# Patient Record
Sex: Male | Born: 1985 | Race: White | Hispanic: No | Marital: Married | Smoking: Never smoker
Health system: Southern US, Community
[De-identification: ages and names within clinical notes are randomized; demographics above are authoritative.]

## PROBLEM LIST (undated history)

## (undated) DIAGNOSIS — S83511A Sprain of anterior cruciate ligament of right knee, initial encounter: Secondary | ICD-10-CM

## (undated) DIAGNOSIS — T7840XA Allergy, unspecified, initial encounter: Secondary | ICD-10-CM

## (undated) DIAGNOSIS — K219 Gastro-esophageal reflux disease without esophagitis: Secondary | ICD-10-CM

## (undated) DIAGNOSIS — S83241A Other tear of medial meniscus, current injury, right knee, initial encounter: Secondary | ICD-10-CM

## (undated) DIAGNOSIS — G4730 Sleep apnea, unspecified: Secondary | ICD-10-CM

## (undated) HISTORY — DX: Sprain of anterior cruciate ligament of right knee, initial encounter: S83.511A

## (undated) HISTORY — DX: Allergy, unspecified, initial encounter: T78.40XA

## (undated) HISTORY — DX: Other tear of medial meniscus, current injury, right knee, initial encounter: S83.241A

---

## 2018-05-24 ENCOUNTER — Ambulatory Visit: Payer: Self-pay | Admitting: Sports Medicine

## 2018-05-25 ENCOUNTER — Ambulatory Visit: Payer: Managed Care, Other (non HMO) | Admitting: Sports Medicine

## 2018-05-25 VITALS — BP 118/70 | Ht 69.0 in | Wt 165.0 lb

## 2018-05-25 DIAGNOSIS — M7661 Achilles tendinitis, right leg: Secondary | ICD-10-CM

## 2018-05-25 DIAGNOSIS — M7662 Achilles tendinitis, left leg: Secondary | ICD-10-CM

## 2018-05-25 MED ORDER — NITROGLYCERIN 0.2 MG/HR TD PT24: MEDICATED_PATCH | TRANSDERMAL | 1 refills | Status: DC

## 2018-05-25 NOTE — Patient Instructions (Signed)
Nitroglycerin Protocol   Apply 1/4 nitroglycerin patch to affected area daily.  Change position of patch within the affected area every 24 hours.  You may experience a headache during the first 1-2 weeks of using the patch, these should subside.  If you experience headaches after beginning nitroglycerin patch treatment, you may take your preferred over the counter pain reliever.  Another side effect of the nitroglycerin patch is skin irritation or rash related to patch adhesive.  Please notify our office if you develop more severe headaches or rash, and stop the patch.  Tendon healing with nitroglycerin patch may require 12 to 24 weeks depending on the extent of injury.  Men should not use if taking Viagra, Cialis, or Levitra.   Do not use if you have migraines or rosacea.   Try the patch on one side initially. If that goes well you can begin using it on both achilles tendons.

## 2018-05-25 NOTE — Progress Notes (Signed)
   Subjective:    Patient ID: Leroy Griffith, male    DOB: 11/28/1986, 32 y.o.   MRN: [id number]  HPI Pt presents with B achilles pain since October. He is a long time runner and has run several marathons. Last April, 2018, he ran a half marathon in a low drop shoe Janith Lima([employer]). He works as a Print production plannerschool librarian and the trainer suggested he wear a shoe with more of a heel lift so he switched to a [employer] with more of a heel lift and cup. He only ran about 30 miles in those prior to an October marathon and felt like the heel cup was hitting his mid Achilles. The school trainer expressed concern that he may have tenosynovitis and said to ice and stretch. He was icing but has stopped as he felt like he was no longer getting any benefit from it. No meds tried. He has had a noticeable lump in B mid Achilles since October that is tender to touch. He feels very stiff in his Achilles when he first gets up in the morning and when he is still for a prolonged period of time. This sensation tends to improve with movement. He notices his Achilles all the time but they seem to feel better as the day progresses. He has not run or gotten much exercise since January due to pain and concern that he would makes things worse. Pain is currently 3/10 but gets up to 6-7/10. No arch/ foot/ calf/ knee/ hip pain. No distinct injury. He thinks this is just from overuse. He has not tried any type of splint. No paresthesias or other concerns.  Past medical history reviewed Medications reviewed Allergies reviewed   Review of Systems As above    Objective:   Physical Exam Seated on exam table in NAD B Achilles tendons are visibly swollen centrally, the remainder of BLEs and foot appear WNL Palpation of B Achilles are very TTP centrally; they are NTTP over the origin and insertion NL ROM of the ankle NL strength of the ankle with doris and plantar flexion B ankles are stable NVI with 2/2 B DP and PT pulses  Limited BS US performed  which showed significant B Achilles tendon edema with interstitial partial tearing, L > R noted on transverse view.     Assessment & Plan:  B Achilles tendinopathy  Tendinopathy arising as a result of wearing low drop shoe last year. Green shoe inserts with B 5/16" heel lifts. Avoid walking barefoot. NTG protocol B Achilles. Start with one side to assess how he is able to tolerate it then add second side. Expect to continue this tx for 3-6 months if tolerated to improve blood flow and expedite healing. B Achilles strengthening exercises (heel drops). May start doing some running again on a flat surface. No sprinting or jumping. F/u in 6 weeks. Will plan on rechecking ultrasound at that time. Anticipate improvement of symptoms prior to improvement on ultrasound visualization.  Total time spent with the patient was 30 minutes with greater than 50% of the time spent in face-to-face consultation discussing his diagnosis, treatment, and performing bilateral Achilles ultrasounds.

## 2018-05-26 ENCOUNTER — Encounter: Payer: Self-pay | Admitting: Sports Medicine

## 2018-07-12 ENCOUNTER — Ambulatory Visit: Payer: Managed Care, Other (non HMO) | Admitting: Sports Medicine

## 2018-07-12 VITALS — BP 104/72 | Ht 69.0 in | Wt 165.0 lb

## 2018-07-12 DIAGNOSIS — M7661 Achilles tendinitis, right leg: Secondary | ICD-10-CM

## 2018-07-12 DIAGNOSIS — M7662 Achilles tendinitis, left leg: Secondary | ICD-10-CM | POA: Diagnosis not present

## 2018-07-12 MED ORDER — NITROGLYCERIN 0.2 MG/HR TD PT24: MEDICATED_PATCH | TRANSDERMAL | 1 refills | Status: DC

## 2018-07-12 NOTE — Progress Notes (Signed)
   Subjective:    Patient ID: Leroy Griffith, male    DOB: 05/02/1986, 32 y.o.   MRN: [id number]  HPI Patient is a 32 yo male who presents today to follow up on bilateral achilles tendinopathy secondary to partial interstitial partial tear. Patient was seen in clinic 6 weeks ago and had edema seen on US as well as swelling around the mid substance of his achilles. Patient was given heel lift on green insoles (5/16") he has been using them in all his shoes. Patient has also been doing heel drop exercises and use nitroglycerin patch on both achilles. He reports he feels 40% better, he still has some swelling but pain is much better controlled. Patient is still not running. He understands this is going to be a long process 3-6 months but does not want to rupture to achilles tendon by returning to running to soon.  Review of Systems  Skin: Negative.   Neurological: Negative.   Bilateral achilles tenderness Bilateral achilles swelling    Objective:   Physical Exam General: NAD, pleasant, able to participate in exam Achilles Exam: Bilateral mid substance swelling noted on exam. Patient is tender to palpation bilaterally. No tenderness to palpation at the insertion point. Full ROM at the ankle bilaterally. Strength and sensation intact. Neurovascularly intact with palpable pulses and warm feet.     Assessment & Plan:   U/S bilateral achilles Decrease interval edema bilaterally with sign of interstitial partial tearing both  In long and short axis. Thickened tendons bilaterally consistent with prior imaging findings.  Bilateral achilles tendinopathy, chronic, improving  Patient reports moderate improvement with heel lift, hell drop exercises and nitro patch. Still limited from a running standpoint. U/S still showing mild edema with thickening of the tendons bilaterally. Exam findings consistent with imaging with some swelling and tenderness in the mid substance bilaterally. We will continue with current  treatment plan, patient was provided with another set of heel lift. Nitroglycerin patches were refilled. Continue with heel drop exercises. Reiterated no jumping or sprinting, or walking barefoot. Patient will follow up in clinic in 4-6 weeks for reassessment. Expectation for full recovery could be up to 6 months. Could resume running on flat surface.   Patient seen and evaluated with the resident. I agree with the above plan of care. Patient is showing clinical improvement and today's ultrasound shows less fluid in the Achilles tendon sheath. There is still evidence of tendinopathy and interstitial tearing. Patient will continue with current treatment plan. I did give him another pair of heel lifts to use in another pair shoes. Continue with nitroglycerin patches. Continue with eccentric heel drops. Follow-up in 6 weeks for reevaluation. He will be 3 months out from diagnosis at that time. We will plan on repeating his ultrasound at that visit.

## 2018-07-13 ENCOUNTER — Telehealth: Payer: Self-pay | Admitting: Sports Medicine

## 2018-07-13 NOTE — Telephone Encounter (Signed)
Patient needs for us to call [facility] on [address], and let them know to change script to a 90 day supply (for Nitro patch) since he is going away, and let them know that enough time has elapsed since last prescription.  [phone]

## 2018-07-21 ENCOUNTER — Other Ambulatory Visit: Payer: Self-pay | Admitting: *Deleted

## 2018-07-21 MED ORDER — NITROGLYCERIN 0.2 MG/HR TD PT24: MEDICATED_PATCH | TRANSDERMAL | 1 refills | Status: DC

## 2018-08-24 ENCOUNTER — Ambulatory Visit: Payer: Managed Care, Other (non HMO) | Admitting: Sports Medicine

## 2018-08-24 VITALS — BP 114/64 | Ht 69.0 in | Wt 165.0 lb

## 2018-08-24 DIAGNOSIS — M7661 Achilles tendinitis, right leg: Secondary | ICD-10-CM | POA: Diagnosis not present

## 2018-08-24 DIAGNOSIS — M7662 Achilles tendinitis, left leg: Secondary | ICD-10-CM | POA: Diagnosis not present

## 2018-08-25 NOTE — Progress Notes (Signed)
   Subjective:    Patient ID: Leroy Griffith, male    DOB: 02-May-1986, 32 y.o.   MRN: [id number]  HPI   Leroy Griffith comes in today for follow-up on bilateral Achilles tendinopathy. He is now 3 months into treatment.He is using a quarter patch nitroglycerin. He is doing his eccentric exercises. He has been wearing his heel lifts. He has not returned to running yet.Overall, he is doing very well. He did "tweak" his right Achilles tendon the other day when chasing his kids. It was a little sore the next day but his discomfort resolved rather quickly. His left Achilles tendon is almost completely pain-free. He denies any new complaints.   Review of Systems As above    Objective:   Physical Exam  Well-developed, fit appearing. No acute distress. Awake alert and oriented 3. Vital signs reviewed.  Examination of the left Achilles shows minimal thickening in the midportion of the tendon. No tenderness to palpation. Good ankle dorsi flexion. Good strength.Neurovascular intact distally. Examination of the right Achilles tendon still shows moderate thickening in the midportion of the tendon. Mild tenderness to palpation. Good ankle dorsiflexion. Good strength. Neurovascularly intact distally. Patient walks without a limp.  MSK ultrasound of the right Achilles tendon continues to show thickening of the midportion of the tendon both on long and short axis. There is still some interstitial tearing within the tendon as well. Findings consistent with Achilles tendon tendinopathy.      Assessment & Plan:   Bilateral Achilles tendinopathy  Left Achilles tendon is doing much better. Right Achilles tendon is improving but he still has some mild discomfort. I recommended that he discontinue the nitroglycerin patch on the left Achilles and increase the nitroglycerin patch on the right to a half patch daily. He will continue with his eccentric strengthening and I think he is okay to re-introduce some easy running if his  symptoms allow.He will follow-up with me again in 6 weeks for reevaluation. I do not feel the need to repeat his ultrasound at that time. We will consider discontinuing nitroglycerin altogether at that visit if he continues to improve. Patient is encouraged to call me with questions or concerns prior to that visit.

## 2018-09-20 ENCOUNTER — Other Ambulatory Visit: Payer: Self-pay | Admitting: *Deleted

## 2018-09-20 MED ORDER — NITROGLYCERIN 0.2 MG/HR TD PT24: MEDICATED_PATCH | TRANSDERMAL | 1 refills | Status: DC

## 2018-10-11 ENCOUNTER — Ambulatory Visit: Payer: Managed Care, Other (non HMO) | Admitting: Sports Medicine

## 2018-10-11 VITALS — BP 116/64 | Ht 69.0 in | Wt 165.0 lb

## 2018-10-11 DIAGNOSIS — M7662 Achilles tendinitis, left leg: Secondary | ICD-10-CM

## 2018-10-11 DIAGNOSIS — M7661 Achilles tendinitis, right leg: Secondary | ICD-10-CM | POA: Diagnosis not present

## 2018-10-11 NOTE — Progress Notes (Signed)
   Subjective:    Patient ID: Leroy Griffith, male    DOB: 1986-08-21, 32 y.o.   MRN: [id number]  HPI   Leroy Griffith comes in today for follow-up on bilateral Achilles tendinopathy.  Left Achilles tendon is relatively pain-free.  Right Achilles tendon is improving.  He has returned to some limited running.  He has run a couple of 5K's.  Some mild discomfort in the right Achilles but not significant.  He still has thickening in the mid substance of the Achilles.  He is wearing his heel lifts when running.  He continues to use topical nitroglycerin as well.   Review of Systems    As above Objective:   Physical Exam  Well-developed, well-nourished.  No acute distress.  Awake alert and oriented x3.  Vital signs reviewed  Right Achilles tendon: Once again noted is thickening of the mid substance of the Achilles tendon.  It is minimally tender to palpation.  No soft tissue swelling.  No pain with Achilles stretch.  Good strength.  Walking without a limp.      Assessment & Plan:  Improving bilateral Achilles tendinopathy  Patient will continue with his topical nitroglycerin for 1 additional month giving him a total of 6 months of treatment.  He may start to transition to a maintenance phase of heel drop exercises 3 times weekly instead of daily.  He may continue to increase his running utilizing the "10% rule.  He should continue to wear his heel lifts while running.  He has made good progress to date.  I will go ahead and discharge him from my care to follow-up as needed.

## 2018-11-15 ENCOUNTER — Other Ambulatory Visit: Payer: Self-pay | Admitting: *Deleted

## 2018-11-15 MED ORDER — NITROGLYCERIN 0.2 MG/HR TD PT24: MEDICATED_PATCH | TRANSDERMAL | 1 refills | Status: DC

## 2020-02-09 ENCOUNTER — Ambulatory Visit: Payer: Managed Care, Other (non HMO)

## 2021-12-02 ENCOUNTER — Ambulatory Visit: Payer: No Typology Code available for payment source | Admitting: Mental Health

## 2021-12-03 ENCOUNTER — Ambulatory Visit: Payer: No Typology Code available for payment source | Admitting: Mental Health

## 2021-12-17 NOTE — Progress Notes (Signed)
Subjective:    CC: R knee pain  I, Molly Weber, LAT, ATC, am serving as scribe for Dr. Clementeen Graham.  HPI: Pt is a 36 y/o male presenting w/ c/o R knee pain since mid-Dec while playing in a company basketball game.  He states that he hyperextended his knee at that time of injury and felt a pop in his knee.  He developed swelling as well.  His knee pain improved until New Year's day when he planted and rotated while in the kitchen and had a sharp, shooting pain along his R lateral knee and thigh.  He locates his pain to his R lateral knee.  R knee swelling: yes R knee mechanical symptoms: yes at the time of the basketball injury R LE numbness/tingling: yes into his R dorsal foot Aggravating factors: attempts at R knee ext; walking/weight-bearing  Treatments tried: ice; IBU; rest  Pertinent review of Systems: No fevers or chills  Relevant historical information: Otherwise healthy   Objective:    Vitals:   12/18/21 0922  BP: 110/70  Pulse: 83  SpO2: 97%   General: Well Developed, well nourished, and in no acute distress.   MSK: Right knee moderate effusion. Range of motion 5-100 degrees. Tender palpation lateral joint line. No laxity to LCL or MCL stress test. Positive lateral McMurray's test. Some laxity with guarding with anterior drawer test. Intact strength however pain with resisted knee flexion. Significant antalgic gait.  L-spine nontender midline.  Normal lumbar motion.  Lower extremity strength is intact.  Reflexes are intact.   Lab and Radiology Results  X-ray images right knee obtained today personally and independently interpreted No acute fractures.  No significant DJD. Await formal radiology review    Impression and Recommendations:    Assessment and Plan: 36 y.o. male with right knee pain with effusion after a mechanical injury to the knee playing basketball a few weeks ago.  Physical exam highly concerning for significant displaced meniscus  tear with possible ACL tear. Based on his significant lack of range of motion and positive McMurray's test and anterior drawer test as well as difficulty ambulating plan for MRI to further characterize internal derangement.  If MRI shows suspected lateral meniscus tear and possible ACL tear may refer directly to orthopedic surgery. Recommended crutches for now.  Tylenol and high-dose NSAID as needed for pain control.  Additionally patient does have some pain radiating down the lateral thigh to the lateral calf dorsal foot.  This is most likely L5 radiculopathy or perhaps peroneal nerve irritation.  Fortunately does not have distal weakness.  The symptoms are much less dominant than his knee symptoms.  Plan for watchful waiting for now.  Backup prednisone and gabapentin prescribed that he can take if needed.  I do not expect it will actually be taken these medications for now.  PDMP not reviewed this encounter. Orders Placed This Encounter  Procedures   DG Knee AP/LAT W/Sunrise Right    Standing Status:   Future    Number of Occurrences:   1    Standing Expiration Date:   01/18/2022    Order Specific Question:   Reason for Exam (SYMPTOM  OR DIAGNOSIS REQUIRED)    Answer:   R knee pain    Order Specific Question:   Preferred imaging location?    Answer:   [facility]   MR Knee Right Wo Contrast    R knee MRI to r/o possible ACL and meniscal tear.  Standing Status:   Future    Standing Expiration Date:   12/18/2022    Order Specific Question:   What is the patient's sedation requirement?    Answer:   No Sedation    Order Specific Question:   Does the patient have a pacemaker or implanted devices?    Answer:   No    Order Specific Question:   Preferred imaging location?    Answer:   [facility] (table limit-350lbs)   Meds ordered this encounter  Medications   predniSONE (DELTASONE) 50 MG tablet    Sig: Take 1 tablet (50 mg total) by mouth daily with breakfast for 5 days.     Dispense:  5 tablet    Refill:  0   gabapentin (NEURONTIN) 300 MG capsule    Sig: Take 1 capsule (300 mg total) by mouth 3 (three) times daily.    Dispense:  90 capsule    Refill:  2    Discussed warning signs or symptoms. Please see discharge instructions. Patient expresses understanding.   The above documentation has been reviewed and is accurate and complete Clementeen Graham, M.D.

## 2021-12-18 ENCOUNTER — Other Ambulatory Visit: Payer: Self-pay

## 2021-12-18 ENCOUNTER — Ambulatory Visit: Payer: Self-pay

## 2021-12-18 ENCOUNTER — Ambulatory Visit (INDEPENDENT_AMBULATORY_CARE_PROVIDER_SITE_OTHER): Payer: No Typology Code available for payment source | Admitting: Mental Health

## 2021-12-18 ENCOUNTER — Ambulatory Visit (INDEPENDENT_AMBULATORY_CARE_PROVIDER_SITE_OTHER): Payer: PRIVATE HEALTH INSURANCE | Admitting: Family Medicine

## 2021-12-18 ENCOUNTER — Ambulatory Visit (INDEPENDENT_AMBULATORY_CARE_PROVIDER_SITE_OTHER): Payer: PRIVATE HEALTH INSURANCE

## 2021-12-18 ENCOUNTER — Encounter: Payer: Self-pay | Admitting: Family Medicine

## 2021-12-18 VITALS — BP 110/70 | HR 83 | Ht 69.0 in | Wt 184.8 lb

## 2021-12-18 DIAGNOSIS — R202 Paresthesia of skin: Secondary | ICD-10-CM | POA: Diagnosis not present

## 2021-12-18 DIAGNOSIS — M25561 Pain in right knee: Secondary | ICD-10-CM

## 2021-12-18 DIAGNOSIS — F4323 Adjustment disorder with mixed anxiety and depressed mood: Secondary | ICD-10-CM

## 2021-12-18 MED ORDER — PREDNISONE 50 MG PO TABS: 50.0000 mg | ORAL_TABLET | Freq: Every day | ORAL | 0 refills | Status: AC

## 2021-12-18 MED ORDER — GABAPENTIN 300 MG PO CAPS: 300.0000 mg | ORAL_CAPSULE | Freq: Three times a day (TID) | ORAL | 2 refills | Status: DC

## 2021-12-18 NOTE — Patient Instructions (Addendum)
Nice to meet you.  I've prescribed Gabapentin and prednisone to use as needed.  I've referred you for a R knee MRI.  They will call you to schedule but I'm also providing you w/ their phone number to use if you haven't heard from them regarding scheduling by Friday.  MRI phone number [phone]  Go to [facility] for some crutches.  Follow-up: as needed based on the MRI results

## 2021-12-18 NOTE — Progress Notes (Signed)
[facility] Initial Adult Exam  Name: Ilias Stcharles Date: 12/18/2021 MRN: [id number] DOB: 1986-07-07 PCP: Cheral Bay, MD  Time spent: 50 minutes  Reason for Visit /Presenting Problem:  He states he is here to ensure he is being supportive to his family. His daughter copes w/ OCD, anxiety-  germ related for the past 2 years; she is age 37. Son is age 37. Wife had a miscarriage between the two. After the miscarriage, wife developed a lot of anxiety. Patient stated his father committed suicide about 10 years ago, was an alcoholic.  Stated his daughter is "strong-willed".  He stated wife and at times, daughter who both cope w/ germ related anxiety. He feels "micco-managed" at times but his wife at times.  1 example of her obsessive compulsive tendencies or if he is going to bed and touches his friends set his alarm on his nightstand, he limits to get up and wash his hands prior to returning to bed.  If he does not,the sheets have to be washed etc. He stated that he has noticed when he and his wife visit her parents, she is more able to work on some of her anxiety related issues.  He stated that he is supportive of her continuing her therapy and how their daughter is also currently in therapy.  Mental Status Exam:    Appearance:    Casual     Behavior:   Appropriate  Motor:   WNL  Speech/Language:    Clear and Coherent  Affect:   Full range   Mood:   Euthymic  Thought process:   Logical, linear, goal directed  Thought content:     WNL  Sensory/Perceptual disturbances:     none  Orientation:   x4  Attention:   Good  Concentration:   Good  Memory:   Intact  Fund of knowledge:    Consistent with age and development  Insight:     Good  Judgment:    Good  Impulse Control:   Good     Reported Symptoms:  intermittent anxiety and depressed mood related to daily stressors  Risk Assessment: Danger to Self:  No Self-injurious Behavior: No Danger to Others: No Duty to Warn:no Physical  Aggression / Violence:No  Access to Firearms a concern: No  Gang Involvement:No  Patient / guardian was educated about steps to take if suicide or homicide risk level increases between visits: yes While future psychiatric events cannot be accurately predicted, the patient does not currently require acute inpatient psychiatric care and does not currently meet [facility] involuntary commitment criteria.  Substance Abuse History: Current substance abuse: none  Medications: Current Outpatient Medications  Medication Sig Dispense Refill   cetirizine (ZYRTEC) 10 MG tablet Take by mouth.     Multiple Vitamin (THERA) TABS Take by mouth.     nitroGLYCERIN (NITRODUR - DOSED IN MG/24 HR) 0.2 mg/hr patch Place 1/4 patch to each leg 30 patch 1   No current facility-administered medications for this visit.    No Known Allergies  Diagnoses:    ICD-10-CM   1. Adjustment disorder with mixed anxiety and depressed mood  F43.23       Plan of Care: TBD   Waldron Session, [facility]

## 2021-12-19 NOTE — Progress Notes (Signed)
Right knee x-ray shows some joint swelling but no fractures.  MRI should be helpful

## 2021-12-21 ENCOUNTER — Other Ambulatory Visit: Payer: Self-pay

## 2021-12-21 ENCOUNTER — Ambulatory Visit (INDEPENDENT_AMBULATORY_CARE_PROVIDER_SITE_OTHER): Payer: PRIVATE HEALTH INSURANCE

## 2021-12-21 DIAGNOSIS — M25561 Pain in right knee: Secondary | ICD-10-CM | POA: Diagnosis not present

## 2021-12-22 ENCOUNTER — Encounter: Payer: Self-pay | Admitting: Family Medicine

## 2021-12-23 ENCOUNTER — Telehealth: Payer: Self-pay | Admitting: Family Medicine

## 2021-12-23 ENCOUNTER — Encounter: Payer: Self-pay | Admitting: Orthopedic Surgery

## 2021-12-23 DIAGNOSIS — S83211A Bucket-handle tear of medial meniscus, current injury, right knee, initial encounter: Secondary | ICD-10-CM

## 2021-12-23 DIAGNOSIS — S83511A Sprain of anterior cruciate ligament of right knee, initial encounter: Secondary | ICD-10-CM

## 2021-12-23 NOTE — Progress Notes (Signed)
MRI shows a ACL tear and a bucket-handle meniscus tear.  Both of these will require surgery.  I will refer you directly to orthopedic surgery.  I am glad we did the MRI so quickly.

## 2021-12-23 NOTE — Telephone Encounter (Signed)
Dr. Denyse Amass has responded to pt's MyChart message and pt informed that he has been referred to [facility]

## 2021-12-23 NOTE — Telephone Encounter (Signed)
Refer to orthopedic surgery at [facility] care.

## 2021-12-25 ENCOUNTER — Ambulatory Visit: Payer: No Typology Code available for payment source | Admitting: Orthopedic Surgery

## 2021-12-27 ENCOUNTER — Other Ambulatory Visit: Payer: Self-pay

## 2021-12-27 ENCOUNTER — Encounter (HOSPITAL_BASED_OUTPATIENT_CLINIC_OR_DEPARTMENT_OTHER): Payer: Self-pay | Admitting: Orthopedic Surgery

## 2021-12-27 ENCOUNTER — Ambulatory Visit (INDEPENDENT_AMBULATORY_CARE_PROVIDER_SITE_OTHER): Payer: PRIVATE HEALTH INSURANCE | Admitting: Orthopedic Surgery

## 2021-12-27 DIAGNOSIS — S83511A Sprain of anterior cruciate ligament of right knee, initial encounter: Secondary | ICD-10-CM | POA: Diagnosis not present

## 2021-12-28 ENCOUNTER — Encounter: Payer: Self-pay | Admitting: Orthopedic Surgery

## 2021-12-28 NOTE — Progress Notes (Signed)
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  He was playing basketball 11/28/2021 when he sustained an injury.  He has a pivot shift type mechanism of injury and took it easy after that for a week until he had a reinjury in the kitchen about 2 weeks later when he "stepped wrong".  Felt like there was a blood pressure cuff around the knee.  Reports some occasional clicking with normal walking and also describes symptomatic instability in the knee.  He has been using a brace.  He works as a Occupational hygienist.  Normally his activities are running.  He has a son that he likes to do activities with including taekwondo.  No personal or family history of DVT or pulmonary embolism.  MRI scan is reviewed with the patient.  Does show ACL tear along with bruising consistent with pivot shift mechanism of injury.  He also has significant complex medial meniscal tear with small bucket-handle component.              ROS: All systems reviewed are negative as they relate to the chief complaint within the history of present illness.  Patient denies  fevers or chills.   Assessment & Plan: Visit Diagnoses:  1. Rupture of anterior cruciate ligament of right knee, initial encounter     Plan: Impression is right knee ACL tear with complex medial meniscal tear.  Patient is very active.  He is having symptomatic instability.  Has excellent range of motion at this time lacking only about 2 to 3 degrees of full extension.  Plan is ACL reconstruction.  We discussed graft options including allograft versus autograft.  I think he would do well with quadriceps autograft.  We would also proceed with meniscal debridement  leaving as much of the normal rim present as possible.  There is no posterior lateral rotatory instability at this time.  The timeline for recovery including achieving full extension within 3 weeks and full flexion within 6 weeks is discussed.  Anticipate return to straightahead running around 4 months.  I do not think it is a great idea for him to run exclusively for exercise moving forward predictably based on the absence of his meniscal cushioning function in the knee.  Use of CPM machine until he gets to 90 degrees of flexion also discussed.  Driving is more of a 3 to 4-week proposition to return to without concern.  All questions answered.  Follow-Up Instructions: No follow-ups on file.   Orders:  No orders of the defined types were placed in this encounter.  No orders of the defined types were placed in this encounter.     Procedures: No procedures performed   Clinical Data: No additional findings.  Objective: Vital Signs: There were no vitals taken for this visit.  Physical Exam:   Constitutional: Patient appears well-developed HEENT:  Head: Normocephalic Eyes:EOM are normal Neck: Normal range of motion Cardiovascular: Normal rate Pulmonary/chest: Effort normal Neurologic: Patient is alert Skin: Skin is warm Psychiatric: Patient has normal mood and affect   Ortho Exam: Ortho exam demonstrates good  range of motion of the right knee lacking only about 2 to 3 degrees of full extension on the right which is consistent with his bucket-handle meniscal tear.  Flexion is to 120.  Collaterals are stable to varus valgus stress on the right.  Pedal pulses palpable.  No calf tenderness negative Homans on the right.  No posterior lateral rotatory instability is noted.  ACL laxity is present with positive Lachman positive anterior drawer.  PCL intact.  Collaterals are stable to varus valgus stress at 0 and 30 degrees.  Specialty Comments:  No specialty comments  available.  Imaging: No results found.   PMFS History: There are no problems to display for this patient.  History reviewed. No pertinent past medical history.  History reviewed. No pertinent family history.  History reviewed. No pertinent surgical history. Social History   Occupational History   Not on file  Tobacco Use   Smoking status: Never   Smokeless tobacco: Never  Vaping Use   Vaping Use: Never used  Substance and Sexual Activity   Alcohol use: Yes    Comment: occassionally   Drug use: Never   Sexual activity: Not on file

## 2022-01-02 ENCOUNTER — Ambulatory Visit (INDEPENDENT_AMBULATORY_CARE_PROVIDER_SITE_OTHER): Payer: No Typology Code available for payment source | Admitting: Mental Health

## 2022-01-02 DIAGNOSIS — F4323 Adjustment disorder with mixed anxiety and depressed mood: Secondary | ICD-10-CM | POA: Diagnosis not present

## 2022-01-02 NOTE — Progress Notes (Signed)
[facility] psychotherapy note Name: Leroy Griffith Date: 01/02/2022 MRN: [id number] DOB: Apr 09, 1986 PCP: Pcp, No  Time spent: 50 minutes  Treatment: Individual therapy  Virtual Visit via Telehealth Note Connected with patient by a telemedicine/telehealth application, with their informed consent, and verified patient privacy and that I am speaking with the correct person using two identifiers. I discussed the limitations, risks, security and privacy concerns of performing psychotherapy and the availability of in person appointments. I also discussed with the patient that there may be a patient responsible charge related to this service. The patient expressed understanding and agreed to proceed. I discussed the treatment planning with the patient. The patient was provided an opportunity to ask questions and all were answered. The patient agreed with the plan and demonstrated an understanding of the instructions. The patient was advised to call  our office if  symptoms worsen or feel they are in a crisis state and need immediate contact.   Therapist Location: office Patient Location: home    Mental Status Exam:    Appearance:    Casual     Behavior:   Appropriate  Motor:   WNL  Speech/Language:    Clear and Coherent  Affect:   Full range   Mood:   Euthymic  Thought process:   Logical, linear, goal directed  Thought content:     WNL  Sensory/Perceptual disturbances:     none  Orientation:   x4  Attention:   Good  Concentration:   Good  Memory:   Intact  Fund of knowledge:    Consistent with age and development  Insight:     Good  Judgment:    Good  Impulse Control:   Good     Reported Symptoms:  intermittent anxiety and depressed mood related to daily stressors  Risk Assessment: Danger to Self:  No Self-injurious Behavior: No Danger to Others: No Duty to Warn:no Physical Aggression / Violence:No  Access to Firearms a concern: No  Gang Involvement:No  Patient / guardian was  educated about steps to take if suicide or homicide risk level increases between visits: yes While future psychiatric events cannot be accurately predicted, the patient does not currently require acute inpatient psychiatric care and does not currently meet [facility] involuntary commitment criteria.  Substance Abuse History: Current substance abuse: none  Medications: Current Outpatient Medications  Medication Sig Dispense Refill   cetirizine (ZYRTEC) 10 MG tablet Take by mouth.     ibuprofen (ADVIL) 200 MG tablet Take 200 mg by mouth every 6 (six) hours as needed.     PARoxetine (PAXIL) 10 MG tablet Take 10 mg by mouth daily.     No current facility-administered medications for this visit.    Subjective: Patient engaged in telehealth session via video.  He shared recent events in progress where he stated that he has had some recent stressful discussions with his wife.  He stated that she feels a sense of shame and blame from him, primarily citing the reactions he may have that relate to her obsessive-compulsive behaviors.  He went on to share an example of his wanting to take the children to school in her car due to weather conditions, how he was running late to get them in the car however, she did not want him to take her vehicle as she did not have time to wipe down the steering wheel and gear shifter due to her fear of germs.  Patient identified how he wants to be more supportive of her,  work on his being more reactive than moments where it may be upsetting to her, wanting to be more mindful of this where she is not upset.  Patient stated that he reacted with a sigh at the time, this being enough to upset her.  Patient stated in their arguments recently, where she shares that he is upset her by making some comments, he states that he often may try to restate what he actually meant versus how it was received.  We discussed ways to engage in some active listening and these discussions were  patient was receptive. How he has tried to further understand and be mindful of the support that she may need he identified the need to readdress this with her for clarity.  Diagnoses:    ICD-10-CM   1. Adjustment disorder with mixed anxiety and depressed mood  F43.23        Plan: Patient to continue to work on utilizing effective communication in the relationship with his wife.   Long-term goal:  Reduce overall level, frequency, and intensity of the feelings of emotional distress associated in his marital relationship as reported by patient for at least 3 consecutive months.  Short-term goal: To identify and process thoughts and feelings effectively in his marital relationship to avoid having arguments                   Identify and follow through with ways he feels he can more effectively provide emotional support to his wife                      Assessment of progress:  progressing      Waldron Session, [facility]

## 2022-01-03 ENCOUNTER — Encounter (HOSPITAL_BASED_OUTPATIENT_CLINIC_OR_DEPARTMENT_OTHER)
Admission: RE | Admit: 2022-01-03 | Discharge: 2022-01-03 | Disposition: A | Discharge status: Home / Self Care | Payer: PRIVATE HEALTH INSURANCE | Source: Ambulatory Visit | Attending: Orthopedic Surgery | Admitting: Orthopedic Surgery

## 2022-01-03 ENCOUNTER — Other Ambulatory Visit: Payer: Self-pay

## 2022-01-03 DIAGNOSIS — Z01812 Encounter for preprocedural laboratory examination: Secondary | ICD-10-CM | POA: Insufficient documentation

## 2022-01-03 LAB — BASIC METABOLIC PANEL
Anion gap: 8 (ref 5–15)
BUN: 14 mg/dL (ref 6–20)
CO2: 28 mmol/L (ref 22–32)
Calcium: 9.8 mg/dL (ref 8.9–10.3)
Chloride: 101 mmol/L (ref 98–111)
Creatinine, Ser: 1.01 mg/dL (ref 0.61–1.24)
GFR, Estimated: >60 mL/min (ref >60)
Glucose, Bld: 125 mg/dL — ABNORMAL HIGH (ref 70–99)
Potassium: 4.5 mmol/L (ref 3.5–5.1)
Sodium: 137 mmol/L (ref 135–145)

## 2022-01-03 LAB — CBC
HCT: 45.3 % (ref 39.0–52.0)
Hemoglobin: 15.4 g/dL (ref 13.0–17.0)
MCH: 30.1 pg (ref 26.0–34.0)
MCHC: 34 g/dL (ref 30.0–36.0)
MCV: 88.5 fL (ref 80.0–100.0)
Platelets: 214 10*3/uL (ref 150–400)
RBC: 5.12 MIL/uL (ref 4.22–5.81)
RDW: 12.1 % (ref 11.5–15.5)
WBC: 4.8 10*3/uL (ref 4.0–10.5)
nRBC: 0 % (ref 0.0–0.2)

## 2022-01-03 NOTE — Progress Notes (Signed)

## 2022-01-06 ENCOUNTER — Encounter (HOSPITAL_COMMUNITY): Payer: Self-pay | Admitting: Orthopedic Surgery

## 2022-01-06 ENCOUNTER — Other Ambulatory Visit: Payer: Self-pay

## 2022-01-06 ENCOUNTER — Telehealth: Payer: Self-pay

## 2022-01-06 DIAGNOSIS — Z01818 Encounter for other preprocedural examination: Secondary | ICD-10-CM

## 2022-01-06 NOTE — Progress Notes (Signed)
PCP - Dr. Cathlean Sauer  ERAS Protcol - clears until noon  COVID TEST- Ambulatory  Anesthesia review: N  Patient verbally denies any shortness of breath, fever, cough and chest pain during phone call   -------------  SDW INSTRUCTIONS given:  Your procedure is scheduled on 01/07/22.  Report to [facility]" at 1245 P.M., and check in at the Admitting office.  Call this number if you have problems the morning of surgery:  [phone]   Remember:  Do not eat after midnight the night before your surgery  You may drink clear liquids until 1200 the afternoon of your surgery.   Clear liquids allowed are: Water, Non-Citrus Juices (without pulp), Carbonated Beverages, Clear Tea, Black Coffee Only, and Gatorade    Take these medicines the morning of surgery with A SIP OF WATER  PARoxetine (PAXIL)  cetirizine (ZYRTEC)  As of today, STOP taking any Aspirin (unless otherwise instructed by your surgeon) Aleve, Naproxen, Ibuprofen, Motrin, Advil, Goody's, BC's, all herbal medications, fish oil, and all vitamins.                      Do not wear jewelry, make up, or nail polish            Do not wear lotions, powders, perfumes/colognes, or deodorant.            Do not shave 48 hours prior to surgery.  Men may shave face and neck.            Do not bring valuables to the hospital.            [facility] is not responsible for any belongings or valuables.  Do NOT Smoke (Tobacco/Vaping) 24 hours prior to your procedure If you use a CPAP at night, you may bring all equipment for your overnight stay.   Contacts, glasses, dentures or bridgework may not be worn into surgery.      For patients admitted to the hospital, discharge time will be determined by your treatment team.   Patients discharged the day of surgery will not be allowed to drive home, and someone needs to stay with them for 24 hours.    Special instructions:   [facility]- Preparing For Surgery  Before  surgery, you can play an important role. Because skin is not sterile, your skin needs to be as free of germs as possible. You can reduce the number of germs on your skin by washing with CHG (chlorahexidine gluconate) Soap before surgery.  CHG is an antiseptic cleaner which kills germs and bonds with the skin to continue killing germs even after washing.    Oral Hygiene is also important to reduce your risk of infection.  Remember - BRUSH YOUR TEETH THE MORNING OF SURGERY WITH YOUR REGULAR TOOTHPASTE  Please do not use if you have an allergy to CHG or antibacterial soaps. If your skin becomes reddened/irritated stop using the CHG.  Do not shave (including legs and underarms) for at least 48 hours prior to first CHG shower. It is OK to shave your face.  Please follow these instructions carefully.   Shower the NIGHT BEFORE SURGERY and the MORNING OF SURGERY with DIAL Soap.   Pat yourself dry with a CLEAN TOWEL.  Wear CLEAN PAJAMAS to bed the night before surgery  Place CLEAN SHEETS on your bed the night of your first shower and DO NOT SLEEP WITH PETS.   Day of Surgery: Please shower morning  of surgery  Wear Clean/Comfortable clothing the morning of surgery Do not apply any deodorants/lotions.   Remember to brush your teeth WITH YOUR REGULAR TOOTHPASTE.   Questions were answered. Patient verbalized understanding of instructions.

## 2022-01-06 NOTE — Telephone Encounter (Signed)
Surgery for tomorrow changed to [facility] instead of surgery center. I called [employer] and spoke with [facility] and she updated the case.

## 2022-01-07 ENCOUNTER — Encounter (HOSPITAL_COMMUNITY)
Admission: RE | Disposition: A | Discharge status: Home / Self Care | Payer: Self-pay | Source: Home / Self Care | Attending: Orthopedic Surgery

## 2022-01-07 ENCOUNTER — Ambulatory Visit (HOSPITAL_COMMUNITY): Payer: PRIVATE HEALTH INSURANCE | Admitting: Anesthesiology

## 2022-01-07 ENCOUNTER — Other Ambulatory Visit: Payer: Self-pay | Admitting: Surgical

## 2022-01-07 ENCOUNTER — Ambulatory Visit (HOSPITAL_COMMUNITY)
Admission: RE | Admit: 2022-01-07 | Discharge: 2022-01-07 | Disposition: A | Discharge status: Home / Self Care | Payer: PRIVATE HEALTH INSURANCE | Attending: Orthopedic Surgery | Admitting: Orthopedic Surgery

## 2022-01-07 ENCOUNTER — Other Ambulatory Visit: Payer: Self-pay

## 2022-01-07 ENCOUNTER — Ambulatory Visit (HOSPITAL_COMMUNITY): Payer: PRIVATE HEALTH INSURANCE

## 2022-01-07 ENCOUNTER — Encounter (HOSPITAL_COMMUNITY): Payer: Self-pay | Admitting: Orthopedic Surgery

## 2022-01-07 DIAGNOSIS — G4730 Sleep apnea, unspecified: Secondary | ICD-10-CM | POA: Insufficient documentation

## 2022-01-07 DIAGNOSIS — S83241D Other tear of medial meniscus, current injury, right knee, subsequent encounter: Secondary | ICD-10-CM

## 2022-01-07 DIAGNOSIS — Y9367 Activity, basketball: Secondary | ICD-10-CM | POA: Diagnosis not present

## 2022-01-07 DIAGNOSIS — S83231A Complex tear of medial meniscus, current injury, right knee, initial encounter: Secondary | ICD-10-CM | POA: Diagnosis not present

## 2022-01-07 DIAGNOSIS — S83511D Sprain of anterior cruciate ligament of right knee, subsequent encounter: Secondary | ICD-10-CM

## 2022-01-07 DIAGNOSIS — S83211A Bucket-handle tear of medial meniscus, current injury, right knee, initial encounter: Secondary | ICD-10-CM | POA: Diagnosis not present

## 2022-01-07 DIAGNOSIS — S83511A Sprain of anterior cruciate ligament of right knee, initial encounter: Secondary | ICD-10-CM | POA: Diagnosis present

## 2022-01-07 DIAGNOSIS — S83241A Other tear of medial meniscus, current injury, right knee, initial encounter: Secondary | ICD-10-CM

## 2022-01-07 DIAGNOSIS — X58XXXA Exposure to other specified factors, initial encounter: Secondary | ICD-10-CM | POA: Diagnosis not present

## 2022-01-07 DIAGNOSIS — Z01818 Encounter for other preprocedural examination: Secondary | ICD-10-CM

## 2022-01-07 HISTORY — PX: ANTERIOR CRUCIATE LIGAMENT REPAIR: SHX115

## 2022-01-07 HISTORY — DX: Gastro-esophageal reflux disease without esophagitis: K21.9

## 2022-01-07 HISTORY — DX: Sleep apnea, unspecified: G47.30

## 2022-01-07 LAB — SURGICAL PCR SCREEN
MRSA, PCR: NEGATIVE
Staphylococcus aureus: POSITIVE — AB

## 2022-01-07 SURGERY — RECONSTRUCTION, KNEE, ACL, USING HAMSTRING GRAFT
Anesthesia: General | Laterality: Right

## 2022-01-07 MED ORDER — METHOCARBAMOL 500 MG PO TABS: 500.0000 mg | ORAL_TABLET | Freq: Three times a day (TID) | ORAL | 0 refills | Status: DC | PRN

## 2022-01-07 MED ORDER — FENTANYL CITRATE (PF) 250 MCG/5ML IJ SOLN
INTRAMUSCULAR | Status: AC
  Filled 2022-01-07: qty 5

## 2022-01-07 MED ORDER — CELECOXIB 100 MG PO CAPS: 100.0000 mg | ORAL_CAPSULE | Freq: Two times a day (BID) | ORAL | 0 refills | Status: AC

## 2022-01-07 MED ORDER — KETOROLAC TROMETHAMINE 30 MG/ML IJ SOLN: 30.0000 mg | Freq: Once | INTRAMUSCULAR | Status: DC | PRN

## 2022-01-07 MED ORDER — CLONIDINE HCL (ANALGESIA) 100 MCG/ML EP SOLN
EPIDURAL | Status: DC | PRN
  Administered 2022-01-07: 100 ug

## 2022-01-07 MED ORDER — MEPERIDINE HCL 25 MG/ML IJ SOLN: 6.2500 mg | INTRAMUSCULAR | Status: DC | PRN

## 2022-01-07 MED ORDER — GABAPENTIN 300 MG PO CAPS: 300.0000 mg | ORAL_CAPSULE | Freq: Three times a day (TID) | ORAL | 0 refills | Status: DC

## 2022-01-07 MED ORDER — DEXAMETHASONE SODIUM PHOSPHATE 10 MG/ML IJ SOLN
INTRAMUSCULAR | Status: DC | PRN
  Administered 2022-01-07: 10 mg via INTRAVENOUS

## 2022-01-07 MED ORDER — ONDANSETRON HCL 4 MG/2ML IJ SOLN: 4.0000 mg | Freq: Once | INTRAMUSCULAR | Status: DC | PRN

## 2022-01-07 MED ORDER — POVIDONE-IODINE 10 % EX SWAB
2.0000 "application " | Freq: Once | CUTANEOUS | Status: AC
  Administered 2022-01-07: 2 via TOPICAL

## 2022-01-07 MED ORDER — CHLORHEXIDINE GLUCONATE 0.12 % MT SOLN
OROMUCOSAL | Status: AC
  Administered 2022-01-07: 14:00:00 15 mL
  Filled 2022-01-07: qty 15

## 2022-01-07 MED ORDER — ONDANSETRON HCL 4 MG/2ML IJ SOLN
INTRAMUSCULAR | Status: DC | PRN
  Administered 2022-01-07: 4 mg via INTRAVENOUS

## 2022-01-07 MED ORDER — PHENYLEPHRINE 40 MCG/ML (10ML) SYRINGE FOR IV PUSH (FOR BLOOD PRESSURE SUPPORT)
PREFILLED_SYRINGE | INTRAVENOUS | Status: AC
  Filled 2022-01-07: qty 10

## 2022-01-07 MED ORDER — OXYCODONE HCL 5 MG PO TABS: 5.0000 mg | ORAL_TABLET | Freq: Once | ORAL | Status: DC | PRN

## 2022-01-07 MED ORDER — MORPHINE SULFATE (PF) 4 MG/ML IV SOLN
INTRAVENOUS | Status: DC | PRN
  Administered 2022-01-07 (×2): 4 mg

## 2022-01-07 MED ORDER — BUPIVACAINE-EPINEPHRINE (PF) 0.25% -1:200000 IJ SOLN
INTRAMUSCULAR | Status: AC
  Filled 2022-01-07: qty 30

## 2022-01-07 MED ORDER — TRANEXAMIC ACID-NACL 1000-0.7 MG/100ML-% IV SOLN
1000.0000 mg | INTRAVENOUS | Status: AC
  Administered 2022-01-07: 16:00:00 1000 mg via INTRAVENOUS

## 2022-01-07 MED ORDER — LIDOCAINE-EPINEPHRINE 2 %-1:100000 IJ SOLN
INTRAMUSCULAR | Status: DC | PRN
Start: 2022-01-07 — End: 2022-01-07
  Administered 2022-01-07: 10 mL via PERINEURAL

## 2022-01-07 MED ORDER — LACTATED RINGERS IV SOLN: INTRAVENOUS | Status: DC

## 2022-01-07 MED ORDER — PHENYLEPHRINE HCL-NACL 20-0.9 MG/250ML-% IV SOLN
INTRAVENOUS | Status: DC | PRN
  Administered 2022-01-07: 25 ug/min via INTRAVENOUS

## 2022-01-07 MED ORDER — ASPIRIN 81 MG PO CHEW: 81.0000 mg | CHEWABLE_TABLET | Freq: Every day | ORAL | 0 refills | Status: AC

## 2022-01-07 MED ORDER — FENTANYL CITRATE (PF) 250 MCG/5ML IJ SOLN
INTRAMUSCULAR | Status: DC | PRN
  Administered 2022-01-07: 100 ug via INTRAVENOUS
  Administered 2022-01-07: 50 ug via INTRAVENOUS
  Administered 2022-01-07: 100 ug via INTRAVENOUS

## 2022-01-07 MED ORDER — FENTANYL CITRATE (PF) 100 MCG/2ML IJ SOLN
INTRAMUSCULAR | Status: AC
  Administered 2022-01-07: 15:00:00 100 ug via INTRAVENOUS
  Filled 2022-01-07: qty 2

## 2022-01-07 MED ORDER — MIDAZOLAM HCL 2 MG/2ML IJ SOLN
INTRAMUSCULAR | Status: DC | PRN
Start: 2022-01-07 — End: 2022-01-07
  Administered 2022-01-07: 2 mg via INTRAVENOUS

## 2022-01-07 MED ORDER — LIDOCAINE 2% (20 MG/ML) 5 ML SYRINGE
INTRAMUSCULAR | Status: AC
  Filled 2022-01-07: qty 5

## 2022-01-07 MED ORDER — MIDAZOLAM HCL 2 MG/2ML IJ SOLN
INTRAMUSCULAR | Status: AC
  Administered 2022-01-07: 15:00:00 2 mg via INTRAVENOUS
  Filled 2022-01-07: qty 2

## 2022-01-07 MED ORDER — TRANEXAMIC ACID-NACL 1000-0.7 MG/100ML-% IV SOLN
INTRAVENOUS | Status: AC
  Filled 2022-01-07: qty 100

## 2022-01-07 MED ORDER — VANCOMYCIN HCL 1000 MG IV SOLR
INTRAVENOUS | Status: DC | PRN
  Administered 2022-01-07: 1000 mg via TOPICAL

## 2022-01-07 MED ORDER — CEFAZOLIN SODIUM-DEXTROSE 2-4 GM/100ML-% IV SOLN
INTRAVENOUS | Status: AC
  Filled 2022-01-07: qty 100

## 2022-01-07 MED ORDER — HYDROMORPHONE HCL 1 MG/ML IJ SOLN: 0.2500 mg | INTRAMUSCULAR | Status: DC | PRN

## 2022-01-07 MED ORDER — POVIDONE-IODINE 7.5 % EX SOLN
Freq: Once | CUTANEOUS | Status: DC
  Filled 2022-01-07: qty 118

## 2022-01-07 MED ORDER — VANCOMYCIN HCL 1000 MG IV SOLR
INTRAVENOUS | Status: AC
  Filled 2022-01-07: qty 20

## 2022-01-07 MED ORDER — FENTANYL CITRATE (PF) 100 MCG/2ML IJ SOLN: 100.0000 ug | Freq: Once | INTRAMUSCULAR | Status: AC

## 2022-01-07 MED ORDER — OXYCODONE HCL 5 MG/5ML PO SOLN: 5.0000 mg | Freq: Once | ORAL | Status: DC | PRN

## 2022-01-07 MED ORDER — MORPHINE SULFATE (PF) 4 MG/ML IV SOLN
INTRAVENOUS | Status: AC
  Filled 2022-01-07: qty 2

## 2022-01-07 MED ORDER — PROPOFOL 10 MG/ML IV BOLUS
INTRAVENOUS | Status: DC | PRN
Start: 2022-01-07 — End: 2022-01-07
  Administered 2022-01-07: 300 mg via INTRAVENOUS

## 2022-01-07 MED ORDER — BUPIVACAINE-EPINEPHRINE 0.25% -1:200000 IJ SOLN
INTRAMUSCULAR | Status: DC | PRN
  Administered 2022-01-07: 10 mL

## 2022-01-07 MED ORDER — OXYCODONE-ACETAMINOPHEN 5-325 MG PO TABS: 1.0000 | ORAL_TABLET | ORAL | 0 refills | Status: AC | PRN

## 2022-01-07 MED ORDER — PROMETHAZINE HCL 25 MG/ML IJ SOLN: 6.2500 mg | INTRAMUSCULAR | Status: DC | PRN

## 2022-01-07 MED ORDER — PHENYLEPHRINE 40 MCG/ML (10ML) SYRINGE FOR IV PUSH (FOR BLOOD PRESSURE SUPPORT)
PREFILLED_SYRINGE | INTRAVENOUS | Status: DC | PRN
Start: 2022-01-07 — End: 2022-01-07
  Administered 2022-01-07 (×2): 80 ug via INTRAVENOUS

## 2022-01-07 MED ORDER — POVIDONE-IODINE 10 % EX SWAB: 2.0000 "application " | Freq: Once | CUTANEOUS | Status: DC

## 2022-01-07 MED ORDER — EPHEDRINE SULFATE-NACL 50-0.9 MG/10ML-% IV SOSY
PREFILLED_SYRINGE | INTRAVENOUS | Status: DC | PRN
  Administered 2022-01-07: 10 mg via INTRAVENOUS

## 2022-01-07 MED ORDER — CEFAZOLIN SODIUM-DEXTROSE 2-4 GM/100ML-% IV SOLN
2.0000 g | INTRAVENOUS | Status: AC
  Administered 2022-01-07: 16:00:00 2 g via INTRAVENOUS

## 2022-01-07 MED ORDER — ACETAMINOPHEN 325 MG PO TABS: 325.0000 mg | ORAL_TABLET | ORAL | Status: DC | PRN

## 2022-01-07 MED ORDER — EPINEPHRINE PF 1 MG/ML IJ SOLN
INTRAMUSCULAR | Status: AC
  Filled 2022-01-07: qty 4

## 2022-01-07 MED ORDER — SODIUM CHLORIDE 0.9 % IR SOLN: Status: DC | PRN

## 2022-01-07 MED ORDER — MIDAZOLAM HCL 2 MG/2ML IJ SOLN
INTRAMUSCULAR | Status: AC
  Filled 2022-01-07: qty 2

## 2022-01-07 MED ORDER — FENTANYL CITRATE (PF) 100 MCG/2ML IJ SOLN: 25.0000 ug | INTRAMUSCULAR | Status: DC | PRN

## 2022-01-07 MED ORDER — DEXMEDETOMIDINE (PRECEDEX) IN NS 20 MCG/5ML (4 MCG/ML) IV SYRINGE
PREFILLED_SYRINGE | INTRAVENOUS | Status: DC | PRN
  Administered 2022-01-07: 20 ug via INTRAVENOUS

## 2022-01-07 MED ORDER — ACETAMINOPHEN 160 MG/5ML PO SOLN: 325.0000 mg | ORAL | Status: DC | PRN

## 2022-01-07 MED ORDER — EPHEDRINE 5 MG/ML INJ
INTRAVENOUS | Status: AC
  Filled 2022-01-07: qty 5

## 2022-01-07 MED ORDER — LIDOCAINE 2% (20 MG/ML) 5 ML SYRINGE
INTRAMUSCULAR | Status: DC | PRN
Start: 2022-01-07 — End: 2022-01-07
  Administered 2022-01-07: 60 mg via INTRAVENOUS

## 2022-01-07 MED ORDER — ROPIVACAINE HCL 5 MG/ML IJ SOLN
INTRAMUSCULAR | Status: DC | PRN
Start: 2022-01-07 — End: 2022-01-07
  Administered 2022-01-07 (×2): 20 mL via PERINEURAL

## 2022-01-07 MED ORDER — DEXMEDETOMIDINE (PRECEDEX) IN NS 20 MCG/5ML (4 MCG/ML) IV SYRINGE
PREFILLED_SYRINGE | INTRAVENOUS | Status: AC
  Filled 2022-01-07: qty 10

## 2022-01-07 MED ORDER — SODIUM CHLORIDE 0.9 % IR SOLN
Status: DC | PRN
  Administered 2022-01-07 (×2): 3000 mL

## 2022-01-07 MED ORDER — BUPIVACAINE HCL (PF) 0.25 % IJ SOLN
INTRAMUSCULAR | Status: AC
  Filled 2022-01-07: qty 30

## 2022-01-07 MED ORDER — BUPIVACAINE HCL (PF) 0.25 % IJ SOLN
INTRAMUSCULAR | Status: DC | PRN
  Administered 2022-01-07: 30 mL

## 2022-01-07 MED ORDER — MIDAZOLAM HCL 2 MG/2ML IJ SOLN: 2.0000 mg | Freq: Once | INTRAMUSCULAR | Status: AC

## 2022-01-07 SURGICAL SUPPLY — 101 items
ALLOGRAFT GRFTLNK IMPLANT SYST (Anchor) IMPLANT
ANCHOR BUTTON TIGHTROPE W/FC3 (Orthopedic Implant) ×1 IMPLANT
BLADE SURG 10 STRL SS (BLADE) ×2 IMPLANT
BLADE SURG 15 STRL LF DISP TIS (BLADE) ×1 IMPLANT
BLADE SURG 15 STRL SS (BLADE) ×1
BNDG ELASTIC 4X5.8 VLCR NS LF (GAUZE/BANDAGES/DRESSINGS) ×1 IMPLANT
BNDG ELASTIC 4X5.8 VLCR STR LF (GAUZE/BANDAGES/DRESSINGS) ×4 IMPLANT
BNDG ELASTIC 6X5.8 VLCR STR LF (GAUZE/BANDAGES/DRESSINGS) ×2 IMPLANT
BURR OVAL 8 FLU 4.0X13 (MISCELLANEOUS) ×3 IMPLANT
COOLER ICEMAN CLASSIC (MISCELLANEOUS) ×2 IMPLANT
COVER BACK TABLE 60X90IN (DRAPES) ×2 IMPLANT
CUFF TOURN SGL QUICK 34 (TOURNIQUET CUFF) ×1
CUFF TRNQT CYL 34X4.125X (TOURNIQUET CUFF) IMPLANT
DISSECTOR 4.0MM X 13CM (MISCELLANEOUS) ×2 IMPLANT
DRAPE ARTHROSCOPY W/POUCH 90 (DRAPES) ×2 IMPLANT
DRAPE IMP U-DRAPE 54X76 (DRAPES) ×1 IMPLANT
DRAPE INCISE IOBAN 66X45 STRL (DRAPES) ×2 IMPLANT
DRAPE OEC MINIVIEW 54X84 (DRAPES) ×2 IMPLANT
DRAPE U-SHAPE 47X51 STRL (DRAPES) IMPLANT
DRILL FLIPCUTTER III 6-12 (ORTHOPEDIC DISPOSABLE SUPPLIES) ×1 IMPLANT
DRSG PAD ABDOMINAL 8X10 ST (GAUZE/BANDAGES/DRESSINGS) ×1 IMPLANT
DRSG TEGADERM 4X4.75 (GAUZE/BANDAGES/DRESSINGS) ×10 IMPLANT
DURAPREP 26ML APPLICATOR (WOUND CARE) ×2 IMPLANT
DW OUTFLOW CASSETTE/TUBE SET (MISCELLANEOUS) ×2 IMPLANT
ELECT REM PT RETURN 9FT ADLT (ELECTROSURGICAL) ×2
ELECTRODE REM PT RTRN 9FT ADLT (ELECTROSURGICAL) IMPLANT
EXCALIBUR 3.8MM X 13CM (MISCELLANEOUS) IMPLANT
FLIPCUTTER III 6-12 AR-1204FF (ORTHOPEDIC DISPOSABLE SUPPLIES) ×2
GAUZE SPONGE 4X4 12PLY STRL (GAUZE/BANDAGES/DRESSINGS) ×2 IMPLANT
GAUZE XEROFORM 1X8 LF (GAUZE/BANDAGES/DRESSINGS) ×4 IMPLANT
GLOVE SRG 8 PF TXTR STRL LF DI (GLOVE) ×1 IMPLANT
GLOVE SURG LTX SZ6.5 (GLOVE) ×2 IMPLANT
GLOVE SURG LTX SZ8 (GLOVE) ×2 IMPLANT
GLOVE SURG UNDER POLY LF SZ7 (GLOVE) ×2 IMPLANT
GLOVE SURG UNDER POLY LF SZ8 (GLOVE) ×1
GOWN STRL REUS W/ TWL LRG LVL3 (GOWN DISPOSABLE) ×2 IMPLANT
GOWN STRL REUS W/ TWL XL LVL3 (GOWN DISPOSABLE) ×1 IMPLANT
GOWN STRL REUS W/TWL LRG LVL3 (GOWN DISPOSABLE) ×2
GOWN STRL REUS W/TWL XL LVL3 (GOWN DISPOSABLE) ×1
IMMOBILIZER KNEE 22 UNIV (SOFTGOODS) IMPLANT
IMMOBILIZER KNEE 24 THIGH 36 (MISCELLANEOUS) IMPLANT
IMMOBILIZER KNEE 24 UNIV (MISCELLANEOUS) ×2
IMP SYS 2ND FIX PEEK 4.75X19.1 (Miscellaneous) ×2 IMPLANT
IMPL SYS 2ND FX PEEK 4.75X19.1 (Miscellaneous) IMPLANT
IMPL TIGHTROP ABS ACL FIBERTG (Orthopedic Implant) IMPLANT
IMPL TIGHTROPE ABS ACL FIBERTG (Orthopedic Implant) ×2 IMPLANT
KIT BIOCARTILAGE LG JOINT MIX (KITS) ×2 IMPLANT
KNIFE GRAFT ACL 10MM 5952 (MISCELLANEOUS) IMPLANT
KNIFE GRAFT ACL 9MM (MISCELLANEOUS) ×1 IMPLANT
MANIFOLD NEPTUNE II (INSTRUMENTS) ×2 IMPLANT
NDL HYPO 18GX1.5 BLUNT FILL (NEEDLE) ×1 IMPLANT
NDL SAFETY ECLIPSE 18X1.5 (NEEDLE) ×2 IMPLANT
NDL SPNL 18GX3.5 QUINCKE PK (NEEDLE) IMPLANT
NEEDLE HYPO 18GX1.5 BLUNT FILL (NEEDLE) ×2 IMPLANT
NEEDLE HYPO 18GX1.5 SHARP (NEEDLE) ×2
NEEDLE SPNL 18GX3.5 QUINCKE PK (NEEDLE) IMPLANT
PACK ARTHROSCOPY DSU (CUSTOM PROCEDURE TRAY) ×2 IMPLANT
PACK BASIN DAY SURGERY FS (CUSTOM PROCEDURE TRAY) ×2 IMPLANT
PAD CAST 3X4 CTTN HI CHSV (CAST SUPPLIES) IMPLANT
PAD CAST 4YDX4 CTTN HI CHSV (CAST SUPPLIES) ×1 IMPLANT
PAD COLD SHLDR WRAP-ON (PAD) ×2 IMPLANT
PADDING CAST COTTON 3X4 STRL (CAST SUPPLIES) ×1
PADDING CAST COTTON 4X4 STRL (CAST SUPPLIES) ×1
PADDING CAST COTTON 6X4 STRL (CAST SUPPLIES) ×3 IMPLANT
PENCIL SMOKE EVACUATOR (MISCELLANEOUS) IMPLANT
PK GRAFTLINK ALLO IMPLANT SYST (Anchor) ×4 IMPLANT
PORT APPOLLO RF 90DEGREE MULTI (SURGICAL WAND) ×2 IMPLANT
PUTTY DBM ALLOSYNC PURE 5CC (Putty) ×2 IMPLANT
SLEEVE SCD COMPRESS KNEE MED (STOCKING) ×2 IMPLANT
SPONGE T-LAP 18X18 ~~LOC~~+RFID (SPONGE) ×2 IMPLANT
SPONGE T-LAP 4X18 ~~LOC~~+RFID (SPONGE) ×4 IMPLANT
STRIP CLOSURE SKIN 1/2X4 (GAUZE/BANDAGES/DRESSINGS) ×2 IMPLANT
SUCTION FRAZIER HANDLE 10FR (MISCELLANEOUS) ×1
SUCTION TUBE FRAZIER 10FR DISP (MISCELLANEOUS) ×1 IMPLANT
SUT 0 FIBERLOOP 38 BLUE TPR ND (SUTURE)
SUT ETHILON 3 0 PS 1 (SUTURE) ×4 IMPLANT
SUT FIBERWIRE #2 38 T-5 BLUE (SUTURE)
SUT FIBERWIRE 2-0 18 17.9 3/8 (SUTURE)
SUT MNCRL AB 3-0 PS2 18 (SUTURE) ×2 IMPLANT
SUT VIC AB 0 CT1 27 (SUTURE) ×3
SUT VIC AB 0 CT1 27XBRD ANBCTR (SUTURE) ×3 IMPLANT
SUT VIC AB 1 CT1 27 (SUTURE) ×1
SUT VIC AB 1 CT1 27XBRD ANBCTR (SUTURE) ×1 IMPLANT
SUT VIC AB 2-0 CT1 27 (SUTURE) ×1
SUT VIC AB 2-0 CT1 TAPERPNT 27 (SUTURE) ×1 IMPLANT
SUT VIC AB 2-0 SH 27 (SUTURE) ×1
SUT VIC AB 2-0 SH 27XBRD (SUTURE) ×1 IMPLANT
SUT VICRYL 0 UR6 27IN ABS (SUTURE) IMPLANT
SUTURE 0 FIBERLP 38 BLU TPR ND (SUTURE) IMPLANT
SUTURE FIBERWR #2 38 T-5 BLUE (SUTURE) IMPLANT
SUTURE FIBERWR 2-0 18 17.9 3/8 (SUTURE) IMPLANT
SUTURE TAPE TIGERLINK 1.3MM BL (SUTURE) IMPLANT
SUTURETAPE TIGERLINK 1.3MM BL (SUTURE)
SYR 5ML LL (SYRINGE) ×2 IMPLANT
SYR BULB IRRIG 60ML STRL (SYRINGE) IMPLANT
SYR TB 1ML LL NO SAFETY (SYRINGE) ×2 IMPLANT
TOWEL GREEN STERILE FF (TOWEL DISPOSABLE) ×4 IMPLANT
TRAY DSU PREP LF (CUSTOM PROCEDURE TRAY) ×2 IMPLANT
TUBING ARTHROSCOPY IRRIG 16FT (MISCELLANEOUS) ×3 IMPLANT
WRAP KNEE MAXI GEL POST OP (GAUZE/BANDAGES/DRESSINGS) ×2 IMPLANT
YANKAUER SUCT BULB TIP NO VENT (SUCTIONS) ×2 IMPLANT

## 2022-01-07 NOTE — Transfer of Care (Signed)
Immediate Anesthesia Transfer of Care Note  Patient: Leroy Griffith  Procedure(s) Performed: RIGHT KNEE ANTERIOR CRUCIATE LIGAMENT RECONSTRUCTION QUAD AUTOGRAFT, PARTIAL MEDIAL MENISCECTOMY (Right)  Patient Location: PACU  Anesthesia Type:GA combined with regional for post-op pain  Level of Consciousness: awake, alert  and oriented  Airway & Oxygen Therapy: Patient Spontanous Breathing  Post-op Assessment: Report given to RN and Post -op Vital signs reviewed and stable  Post vital signs: Reviewed and stable  Last Vitals:  Vitals Value Taken Time  BP 114/64 01/07/22 1842  Temp    Pulse 94 01/07/22 1845  Resp 14 01/07/22 1845  SpO2 99 % 01/07/22 1845  Vitals shown include unvalidated device data.  Last Pain:  Vitals:   01/07/22 1540  TempSrc:   PainSc: 0-No pain         Complications: No notable events documented.

## 2022-01-07 NOTE — Anesthesia Procedure Notes (Signed)
Anesthesia Regional Block: Adductor canal block   Pre-Anesthetic Checklist: , timeout performed,  Correct Patient, Correct Site, Correct Laterality,  Correct Procedure, Correct Position, site marked,  Risks and benefits discussed,  Surgical consent,  Pre-op evaluation,  At surgeon's request and post-op pain management  Laterality: Right  Prep: chloraprep       Needles:  Injection technique: Single-shot  Needle Type: Echogenic Needle     Needle Length: 9cm      Additional Needles:   Procedures:,,,, ultrasound used (permanent image in chart),,    Narrative:  Start time: 01/07/2022 3:22 PM End time: 01/07/2022 3:26 PM Injection made incrementally with aspirations every 5 mL.  Performed by: Personally  Anesthesiologist: Eilene Ghazi, MD  Additional Notes: Patient tolerated the procedure well without complications

## 2022-01-07 NOTE — Anesthesia Procedure Notes (Signed)
Anesthesia Procedure Image    

## 2022-01-07 NOTE — Anesthesia Postprocedure Evaluation (Signed)
Anesthesia Post Note  Patient: Leroy Griffith  Procedure(s) Performed: RIGHT KNEE ANTERIOR CRUCIATE LIGAMENT RECONSTRUCTION QUAD AUTOGRAFT, PARTIAL MEDIAL MENISCECTOMY (Right)     Patient location during evaluation: PACU Anesthesia Type: General Level of consciousness: sedated and patient cooperative Pain management: pain level controlled Vital Signs Assessment: post-procedure vital signs reviewed and stable Respiratory status: spontaneous breathing Cardiovascular status: stable Anesthetic complications: no   No notable events documented.  Last Vitals:  Vitals:   01/07/22 1945 01/07/22 2000  BP: 125/66 126/73  Pulse: (!) 103 97  Resp: 15 15  Temp:  36.6 C  SpO2: 99% 100%    Last Pain:  Vitals:   01/07/22 2000  TempSrc:   PainSc: 0-No pain                 Nolon Nations

## 2022-01-07 NOTE — Brief Op Note (Signed)
° °  01/07/2022  6:36 PM  PATIENT:  Leroy Griffith  36 y.o. male  PRE-OPERATIVE DIAGNOSIS:  right knee anterior cruciate ligament tear, medial meniscal tear  POST-OPERATIVE DIAGNOSIS:  right knee anterior cruciate ligament tear, medial meniscal tear  PROCEDURE:  Procedure(s): RIGHT KNEE ANTERIOR CRUCIATE LIGAMENT RECONSTRUCTION QUAD AUTOGRAFT, PARTIAL MEDIAL MENISCECTOMY  SURGEON:  Surgeon(s): August Saucer, Corrie Mckusick, MD  ASSISTANT: Magnant pa  ANESTHESIA:   general  EBL: 10 ml    Total I/O In: 1200 [I.V.:1200] Out: -   BLOOD ADMINISTERED: none  DRAINS: none   LOCAL MEDICATIONS USED:  marcaine mso4 clonidine  SPECIMEN:  No Specimen  COUNTS:  YES  TOURNIQUET:  * Missing tourniquet times found for documented tourniquets in log: [id number] *  DICTATION: .Other Dictation: Dictation Number done  PLAN OF CARE: Discharge to home after PACU  PATIENT DISPOSITION:  PACU - hemodynamically stable

## 2022-01-07 NOTE — Anesthesia Procedure Notes (Signed)
Procedure Name: LMA Insertion Date/Time: 01/07/2022 4:02 PM Performed by: Darryl Nestle, CRNA Pre-anesthesia Checklist: Patient identified, Emergency Drugs available, Suction available and Patient being monitored Patient Re-evaluated:Patient Re-evaluated prior to induction Oxygen Delivery Method: Circle system utilized Preoxygenation: Pre-oxygenation with 100% oxygen Induction Type: IV induction LMA: LMA inserted LMA Size: 4.0 Tube type: Oral Number of attempts: 1 Airway Equipment and Method: Stylet and Rigid stylet Placement Confirmation: positive ETCO2 and breath sounds checked- equal and bilateral Tube secured with: Tape Dental Injury: Teeth and Oropharynx as per pre-operative assessment

## 2022-01-07 NOTE — Anesthesia Preprocedure Evaluation (Signed)
Anesthesia Evaluation  Patient identified by MRN, date of birth, ID band Patient awake    Reviewed: Allergy & Precautions, NPO status , Patient's Chart, lab work & pertinent test results  Airway Mallampati: II  TM Distance: >3 FB Neck ROM: Full    Dental no notable dental hx.    Pulmonary sleep apnea ,    Pulmonary exam normal breath sounds clear to auscultation       Cardiovascular negative cardio ROS Normal cardiovascular exam Rhythm:Regular Rate:Normal     Neuro/Psych negative neurological ROS  negative psych ROS   GI/Hepatic negative GI ROS, Neg liver ROS,   Endo/Other  negative endocrine ROS  Renal/GU negative Renal ROS  negative genitourinary   Musculoskeletal negative musculoskeletal ROS (+)   Abdominal   Peds negative pediatric ROS (+)  Hematology negative hematology ROS (+)   Anesthesia Other Findings   Reproductive/Obstetrics negative OB ROS                             Anesthesia Physical Anesthesia Plan  ASA: 2  Anesthesia Plan: General   Post-op Pain Management: Regional block   Induction: Intravenous  PONV Risk Score and Plan: 2 and Ondansetron and Dexamethasone  Airway Management Planned: LMA  Additional Equipment:   Intra-op Plan:   Post-operative Plan: Extubation in OR  Informed Consent: I have reviewed the patients History and Physical, chart, labs and discussed the procedure including the risks, benefits and alternatives for the proposed anesthesia with the patient or authorized representative who has indicated his/her understanding and acceptance.     Dental advisory given  Plan Discussed with: CRNA and Surgeon  Anesthesia Plan Comments:         Anesthesia Quick Evaluation

## 2022-01-07 NOTE — H&P (Signed)
Leroy Griffith is an 36 y.o. male.   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  He was playing basketball 11/28/2021 when he sustained an injury.  He has a pivot shift type mechanism of injury and took it easy after that for a week until he had a reinjury in the kitchen about 2 weeks later when he "stepped wrong".  Felt like there was a blood pressure cuff around the knee.  Reports some occasional clicking with normal walking and also describes symptomatic instability in the knee.  He has been using a brace.  He works as a Occupational hygienist.  Normally his activities are running.  He has a son that he likes to do activities with including taekwondo.  No personal or family history of DVT or pulmonary embolism.  MRI scan is reviewed with the patient.  Does show ACL tear along with bruising consistent with pivot shift mechanism of injury.  He also has significant complex medial meniscal tear with small bucket-handle component  Past Medical History:  Diagnosis Date   GERD (gastroesophageal reflux disease)    Sleep apnea     History reviewed. No pertinent surgical history.  History reviewed. No pertinent family history. Social History:  reports that he has never smoked. He has never used smokeless tobacco. He reports current alcohol use. He reports that he does not use drugs.  Allergies: No Known Allergies  Medications Prior to Admission  Medication Sig Dispense Refill   cetirizine (ZYRTEC) 10 MG tablet Take by mouth.     ibuprofen (ADVIL) 200 MG tablet Take 200 mg by mouth every 6 (six) hours as needed.     PARoxetine (PAXIL) 10 MG tablet Take 10 mg by mouth daily.      No results found for this or any previous visit (from the past 48 hour(s)). No results found.  Review of Systems  Musculoskeletal:  Positive for arthralgias.  All other systems reviewed and are negative.  Blood pressure 131/73, pulse 86, temperature 97.9 F (36.6 C),  temperature source Oral, resp. rate 18, height 5\' 9"  (1.753 m), weight 82.6 kg, SpO2 100 %. Physical Exam Vitals reviewed.  HENT:     Head: Normocephalic.     Nose: Nose normal.     Mouth/Throat:     Mouth: Mucous membranes are moist.  Eyes:     Pupils: Pupils are equal, round, and reactive to light.  Cardiovascular:     Rate and Rhythm: Normal rate.     Pulses: Normal pulses.  Pulmonary:     Effort: Pulmonary effort is normal.  Abdominal:     General: Abdomen is flat.  Musculoskeletal:     Cervical back: Normal range of motion.  Skin:    General: Skin is warm.     Capillary Refill: Capillary refill takes less than 2 seconds.  Neurological:     General: No focal deficit present.     Mental Status: He is alert.  Psychiatric:        Mood and Affect: Mood normal.    Ortho exam demonstrates good range of motion of the right knee lacking only about 2 to 3 degrees of full extension on the right which is consistent with his bucket-handle meniscal tear.  Flexion is to 120.  Collaterals are stable to varus valgus stress on the right.  Pedal pulses palpable.  No calf tenderness negative Homans on the right.  No posterior lateral rotatory instability is noted.  ACL  laxity is present with positive Lachman positive anterior drawer.  PCL intact.  Collaterals are stable to varus valgus stress at 0 and 30 degrees. Assessment/Plan Impression is right knee ACL tear with complex medial meniscal tear.  Patient is very active.  He is having symptomatic instability.  Has excellent range of motion at this time lacking only about 2 to 3 degrees of full extension.  Plan is ACL reconstruction.  We discussed graft options including allograft versus autograft.  I think he would do well with quadriceps autograft.  We would also proceed with meniscal debridement leaving as much of the normal rim present as possible.  There is no posterior lateral rotatory instability at this time.  The timeline for recovery  including achieving full extension within 3 weeks and full flexion within 6 weeks is discussed.  Anticipate return to straightahead running around 4 months.  I do not think it is a great idea for him to run exclusively for exercise moving forward predictably based on the absence of his meniscal cushioning function in the knee.  Use of CPM machine until he gets to 90 degrees of flexion also discussed.  Driving is more of a 3 to 4-week proposition to return to without concern.  All questions answered.    Burnard Bunting, MD 01/07/2022, 3:20 PM

## 2022-01-07 NOTE — Anesthesia Procedure Notes (Signed)
Anesthesia Regional Block: Popliteal block   Pre-Anesthetic Checklist: , timeout performed,  Correct Patient, Correct Site, Correct Laterality,  Correct Procedure, Correct Position, site marked,  Risks and benefits discussed,  Surgical consent,  Pre-op evaluation,  At surgeon's request and post-op pain management  Laterality: Right  Prep: chloraprep       Needles:  Injection technique: Single-shot  Needle Type: Echogenic Needle     Needle Length: 9cm      Additional Needles:   Procedures:,,,, ultrasound used (permanent image in chart),,    Narrative:  Start time: 01/07/2022 3:12 PM End time: 01/07/2022 3:20 PM Injection made incrementally with aspirations every 5 mL.  Performed by: Personally  Anesthesiologist: Myrtie Soman, MD  Additional Notes: Patient tolerated the procedure well without complications

## 2022-01-08 ENCOUNTER — Encounter (HOSPITAL_COMMUNITY): Payer: Self-pay | Admitting: Orthopedic Surgery

## 2022-01-08 NOTE — Op Note (Signed)
NAMEASHUR, Griffith MEDICAL RECORD NO: [id number] ACCOUNT NO: [id number] DATE OF BIRTH: [birth year] FACILITY: [facility] LOCATION: [facility] PHYSICIAN: Yetta Barre. Marlou Sa, MD  Operative Report   PREOPERATIVE DIAGNOSES:  Right knee anterior cruciate ligament tear, medial meniscal tear.  POSTOPERATIVE DIAGNOSES:  Right knee anterior cruciate ligament tear, medial meniscal tear.  PROCEDURE:  Right knee ACL reconstruction using quad autograft, 9 mm graft femur, 10 mm graft tibia with partial medial meniscectomy.  SURGEON:  Yetta Barre. Marlou Sa, MD.  ASSISTANT:  Annie Main, PA.  INDICATIONS:  The patient is a 36 year old patient with right knee pain and instability following injury several weeks ago.  He presents now for operative management after MRI scan shows a bucket handle tear of the medial meniscus along with ACL tear.  PROCEDURE IN DETAIL:  The patient was brought to the operating room where general anesthetic was induced.  Preoperative antibiotics administered.  Timeout was called.  Right leg was prescrubbed with alcohol, Betadine, allowed to air dry, prepped with  DuraPrep solution and draped in sterile manner.  Right knee was examined under anesthesia and found to have full extension, full flexion, good stability to varus and valgus stress at 0 and 30 degrees.  No posterolateral rotatory instability was present.   Following sterile prepping and draping, timeout was called.  Anterior inferolateral and anterior inferomedial portals were numbed up with 5 mL of Marcaine with epinephrine.  The leg was then elevated and exsanguinated with the Esmarch wrap for 14  minutes.  Incision made off the proximal pole of the patella measuring about 7 cm extending proximally.  Skin and subcutaneous tissue was sharply divided.  Using a double wide 9 mm knife, central portion of the quad tendon was harvested for a 74 mm  graft.  This was prepared on the back table by [facility] using Arthrex dual Endobutton suspension  technique.  Concurrent with this, the incision was irrigated and the quad tendon was reapproximated in watertight fashion using #1 Vicryl suture.  Then,  the skin was closed using 0 Vicryl suture, 2-0 Vicryl suture, and strip of Ioban was placed.  Next, anterior inferolateral and anterior inferomedial portals were established.  Diagnostic arthroscopy was performed.  The patient had a torn ACL along with a  medial meniscal tear, which involved about one-half of the anterior, posterior width of the meniscus flipped into the joint, primarily in the white-white zone with a significant radial component as well and it was not repairable.  This meniscus was  debrided.  There was some grade 1-2 chondromalacia in a rippled shape pattern over the medial femoral condyle.  No loose chondral flaps were present.  Lateral compartment had intact meniscus and intact articular cartilage.  Patellofemoral compartment  intact.  No loose bodies in the medial lateral gutter.  Notchplasty performed.  Then, using the Arthrex FlipCutter, 9 mm tunnel was drilled in the femur in the 9 o'clock position.  A 10 mm tunnel drilled in the tibia and the posterior aspect of the  native ACL footprint.  Graft was then passed with bone graft utilized to fill in both the tibial and the femoral tunnels.  The graft was secured after fluoroscopic confirmation of button flipping on the tibial side using Endobutton technique.  Then, the  knee was taken through 25 ranges of motion.  Then, fixed in extension using an Endobutton on the tibial side backed up with a SwiveLock.  It should be noted that internal bracing was also utilized and the final construct was  very stable.  The patient had  a 1 mm Lachman, anterior drawer on examination.  Next, thorough irrigation was performed on all incisions as well as within the knee joint.  Portals were anesthetized using Marcaine, morphine, clonidine, and the residual that was injected into the knee.   The harvest  incision was closed using 3-0 Monocryl for completion followed by Steri-Strips.  Portals were closed using 2-0 Vicryl and 3-0 nylon.  It should also be noted the SwiveLock fixation was utilized as backup on the tibia prior to closure.   Next, the impervious dressings were placed with the knee in flexion.  Ace wrap followed by iceman followed by a knee immobilizer placed.  The patient then transferred to the recovery room in stable condition.   NIK D: 01/07/2022 6:42:18 pm T: 01/08/2022 12:03:00 am  JOB: [id number]

## 2022-01-13 ENCOUNTER — Encounter: Payer: Self-pay | Admitting: Orthopedic Surgery

## 2022-01-13 ENCOUNTER — Encounter: Payer: No Typology Code available for payment source | Admitting: Orthopedic Surgery

## 2022-01-14 ENCOUNTER — Ambulatory Visit: Payer: No Typology Code available for payment source | Admitting: Mental Health

## 2022-01-15 ENCOUNTER — Other Ambulatory Visit: Payer: Self-pay

## 2022-01-15 ENCOUNTER — Encounter: Payer: No Typology Code available for payment source | Admitting: Orthopedic Surgery

## 2022-01-15 ENCOUNTER — Encounter: Payer: Self-pay | Admitting: Orthopedic Surgery

## 2022-01-15 ENCOUNTER — Ambulatory Visit (INDEPENDENT_AMBULATORY_CARE_PROVIDER_SITE_OTHER): Payer: PRIVATE HEALTH INSURANCE | Admitting: Orthopedic Surgery

## 2022-01-15 DIAGNOSIS — S83511A Sprain of anterior cruciate ligament of right knee, initial encounter: Secondary | ICD-10-CM

## 2022-01-15 NOTE — Progress Notes (Signed)
° °  Post-Op Visit Note   Patient: Leroy Griffith           Date of Birth: 23-Dec-1985           MRN: [id number] Visit Date: 01/15/2022 PCP: Pcp, No   Assessment & Plan:  Chief Complaint:  Chief Complaint  Patient presents with   Right Knee - Routine Post Op   Visit Diagnoses:  1. Rupture of anterior cruciate ligament of right knee, initial encounter     Plan: Leroy Griffith is a 36 year old patient is a week out now from right knee ACL reconstruction partial medial meniscectomy.  He is up to 80 on CPM machine.  On exam he comes out straight.  Graft is stable.  Not too much effusion.  All incisions intact.  He cannot do a straight leg raise yet.  No calf tenderness negative Homans.  Plan at this time is to continue aspirin for DVT prophylaxis continue CPM machine until he can bend to 90 easily.  Continue knee immobilizer for ambulation until he can do 15 straight leg raises.  Start physical therapy this week for manual PT for flexion, quad activation, and gait training.  Follow-up in 2 weeks for clinical recheck.  Sutures removed today.  Follow-Up Instructions: Return in about 2 weeks (around 01/29/2022).   Orders:  No orders of the defined types were placed in this encounter.  No orders of the defined types were placed in this encounter.   Imaging: No results found.  PMFS History: There are no problems to display for this patient.  Past Medical History:  Diagnosis Date   GERD (gastroesophageal reflux disease)    Sleep apnea     History reviewed. No pertinent family history.  Past Surgical History:  Procedure Laterality Date   ANTERIOR CRUCIATE LIGAMENT REPAIR Right 01/07/2022   Procedure: RIGHT KNEE ANTERIOR CRUCIATE LIGAMENT RECONSTRUCTION QUAD AUTOGRAFT, PARTIAL MEDIAL MENISCECTOMY;  Surgeon: Cammy Copa, MD;  Location: [facility] OR;  Service: Orthopedics;  Laterality: Right;   Social History   Occupational History   Not on file  Tobacco Use   Smoking status: Never   Smokeless  tobacco: Never  Vaping Use   Vaping Use: Never used  Substance and Sexual Activity   Alcohol use: Yes    Comment: occassionally   Drug use: Never   Sexual activity: Not on file

## 2022-01-15 NOTE — Addendum Note (Signed)
Addended byPrescott Parma on: 01/15/2022 08:48 AM   Modules accepted: Orders

## 2022-01-16 ENCOUNTER — Encounter: Payer: Self-pay | Admitting: Rehabilitative and Restorative Service Providers"

## 2022-01-16 ENCOUNTER — Ambulatory Visit (INDEPENDENT_AMBULATORY_CARE_PROVIDER_SITE_OTHER): Payer: PRIVATE HEALTH INSURANCE | Admitting: Rehabilitative and Restorative Service Providers"

## 2022-01-16 DIAGNOSIS — R262 Difficulty in walking, not elsewhere classified: Secondary | ICD-10-CM | POA: Diagnosis not present

## 2022-01-16 DIAGNOSIS — M25661 Stiffness of right knee, not elsewhere classified: Secondary | ICD-10-CM

## 2022-01-16 DIAGNOSIS — M25561 Pain in right knee: Secondary | ICD-10-CM

## 2022-01-16 DIAGNOSIS — M6281 Muscle weakness (generalized): Secondary | ICD-10-CM

## 2022-01-16 DIAGNOSIS — R600 Localized edema: Secondary | ICD-10-CM

## 2022-01-16 NOTE — Therapy (Signed)
OUTPATIENT PHYSICAL THERAPY LOWER EXTREMITY EVALUATION   Patient Name: Leroy Griffith MRN: [id number] DOB:12/14/1986, 36 y.o., male Today's Date: 01/16/2022   PT End of Session - 01/16/22 1136     Visit Number 1    Number of Visits 20    Date for PT Re-Evaluation 03/27/22    Authorization Type [employer] all saves $30 copay    Progress Note Due on Visit 10    PT Start Time 1140    PT Stop Time 1228    PT Time Calculation (min) 48 min    Activity Tolerance Patient tolerated treatment well    Behavior During Therapy WFL for tasks assessed/performed             Past Medical History:  Diagnosis Date   GERD (gastroesophageal reflux disease)    Sleep apnea    Past Surgical History:  Procedure Laterality Date   ANTERIOR CRUCIATE LIGAMENT REPAIR Right 01/07/2022   Procedure: RIGHT KNEE ANTERIOR CRUCIATE LIGAMENT RECONSTRUCTION QUAD AUTOGRAFT, PARTIAL MEDIAL MENISCECTOMY;  Surgeon: Cammy Copaean, Gregory Scott, MD;  Location: [facility] OR;  Service: Orthopedics;  Laterality: Right;   There are no problems to display for this patient.   PCP: Pcp, No  REFERRING PROVIDER: Cammy Copaean, Gregory Scott, MD  REFERRING [phone]: S83.511A (ICD-10-CM) - Rupture of anterior cruciate ligament of right knee, initial encounter  THERAPY DIAG:  Acute pain of right knee  Muscle weakness (generalized)  Stiffness of right knee, not elsewhere classified  Difficulty in walking, not elsewhere classified  Localized edema  ONSET DATE: Surgery 01/06/2022  SUBJECTIVE:   SUBJECTIVE STATEMENT: S/p 01/07/2022 Rt ACL reconstruction c quad autograft, partial medial menisectomy.  Pt. Indicated he was playing basketball on Dec 15th 2022 and change of direction created tear in knee.  Patient indicated after 2 weeks he thought he had recovered but on New Years Day he did the same movement and felt pain and injury again.  Currently using axillary crutch and immobilizer for ambulation.  Has CPM for home use.   PERTINENT  HISTORY: unremarkable  PAIN:  Are you having pain? Yes NPRS scale: current 4/10, at worst in last week 7/10 Pain location: knee Pain orientation: Right  PAIN TYPE: surgery Pain description: tightness, soreness, occasional tingling/numbness in foot.  Aggravating factors: end ranges, difficulty lifting leg, nighttime pain Relieving factors: medicine, slight knee bend helps  PRECAUTIONS: Knee : no loaded open chain exercise, leg press/squat strengthening 0-60 degrees for first 4-6 weeks  WEIGHT BEARING RESTRICTIONS Continue knee immobilizer for ambulation until he can do 15 straight leg raises.   FALLS:  Has patient fallen in last 6 months? 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  PLOF included running, recreational basketball, playing with kids in yard, bike riding, yard work  PATIENT GOALS Reduce pain   OBJECTIVE:     PATIENT SURVEYS:  FOTO 01/16/2022 intake:  43 predicted:  75  COGNITION:  01/16/2022: Overall cognitive status: Within functional limits for tasks assessed     SENSATION:  01/16/2022: Light touch: Appears intact  Edema/Atrophy:  01/16/2022: jt measurement Lt: 14.5 inch   Rt: 16.5 inch         Quad 6 inches proximal to superior patellar border Lt: 21 inch    Rt: 20.5 inch   PALPATION: 01/16/2022:  Mild tenderness around incisions, distal quad insertion  LE AROM/PROM:  A/PROM Right 01/16/2022 Left 01/16/2022  Hip flexion    Hip extension    Hip abduction    Hip adduction    Hip internal rotation    Hip external rotation    Knee flexion AROM in supine heel slide: 90  PROM in supine heel slide: 93 AROM in supine heel slide: 146  Knee extension AROM in LAQ: -50 c pain noted PROM in supine heel  prop -2  AROM in LAQ:  2 degrees hyperextension   Ankle dorsiflexion    Ankle plantarflexion    Ankle inversion    Ankle eversion     (Blank rows = not tested)  LE MMT:  MMT  Right 01/16/2022 Left 01/16/2022  Hip flexion 4/5 5/5  Hip extension    Hip abduction    Hip adduction    Hip internal rotation    Hip external rotation    Knee flexion 2/5 5/5  Knee extension 1+ 5/5  Ankle dorsiflexion 5/5 5/5  Ankle plantarflexion    Ankle inversion 5/5 5/5  Ankle eversion 5/5 5/5   (Blank rows = not tested)    FUNCTIONAL TESTS:  01/16/2022:  Rt SLS: unable  GAIT:  Assistive device utilized: axillary crutch Lt side, immobilizer Rt knee Level of assistance: Modified independence Comments: Decreased stance on Rt leg, immobilizer limits as expected.     TODAY'S TREATMENT: 01/16/2022:  Therex: HEP instruction/performance c cues for techniques, handout provided.  Trial set performed of each for comprehension and symptom assessment.  See below for HEP.  Cues for ankle pumps during elevation times for swelling control.  Vasopneumatic device 10 mins medium 34 degrees Rt knee    PATIENT EDUCATION:  Education details: HEP, POC, precautions Person educated: Patient Education method: Explanation, Demonstration, Verbal cues, and Handouts Education comprehension: verbalized understanding and returned demonstration   HOME EXERCISE PROGRAM: Access Code: [email or web address] URL: [email or web address] Date: 01/16/2022 Prepared by: Chyrel Masson  Exercises Supine Quad Set (Mirrored) - 2 x daily - 7 x weekly - 1 sets - 10 reps - 5 hold Supine Heel Slide - 2 x daily - 7 x weekly - 3 sets - 10 reps - 2 hold Seated Long Arc Quad - 2 x daily - 7 x weekly - 3 sets - 10 reps - 2 hold Prone Quadriceps Set - 2 x daily - 7 x weekly - 3 sets - 10 reps Supine Knee Extension Mobilization with Weight - 1 x daily - 7 x weekly - 1 sets - 4 reps - to tolerance up to 15 mins  hold   ASSESSMENT:  CLINICAL IMPRESSION: Patient is a 36 y.o. who comes to clinic with complaints of Rt knee pain s/p recent Rt ACL reconstruction with quad autograft performed on 01/07/2022 with mobility, strength and movement coordination deficits that impair their ability to perform usual daily and recreational functional activities without increase difficulty/symptoms at this time.  Patient to benefit from skilled PT services to address impairments and limitations to improve to previous level of function without restriction secondary to condition. Objective impairments include Abnormal gait, decreased activity tolerance, decreased balance, decreased coordination, decreased endurance, decreased mobility, difficulty walking, decreased ROM, decreased strength, increased edema, impaired flexibility, improper body mechanics, postural dysfunction, and pain. These impairments are limiting patient from community activity, driving, meal prep, occupation, yard work, and shopping. Personal factors including Patient will benefit from skilled PT to address above impairments and improve overall function.  REHAB  POTENTIAL: Good  CLINICAL DECISION MAKING: Stable/uncomplicated  EVALUATION COMPLEXITY: Low   GOALS: Goals reviewed with patient? Yes  SHORT TERM GOALS:  STG Name Target Date Goal status  1 Patient will demonstrate independent use of home exercise program to maintain progress from in clinic treatments.   02/06/2022 INITIAL  2 Patient will demonstrate safe independent ambulation community distances  02/06/2022 Initial                            LONG TERM GOALS:   LTG Name Target Date Goal status  1 Patient will demonstrate/report pain at worst less than or equal to 2/10 to facilitate minimal limitation in daily activity secondary to pain symptoms.  03/27/2022 INITIAL  2 Patient will demonstrate independent use of home exercise program to facilitate ability to maintain/progress functional  gains from skilled physical therapy services.  03/27/2022 INITIAL  3 Patient will demonstrate FOTO outcome > or = 75% to indicated reduced disability due to condition. 03/27/2022 INITIAL  4 Patient will demonstrate Rt  knee AROM 0-120 degrees to facilitate ability to perform transfers, sitting, ambulation, stair navigation s restriction due to mobility. 03/27/2022 INITIAL  5 Patient will demonstrate Rt LE MMT 5/5, within 15% dynamometry readings throughout to facilitate ability to perform usual standing, walking, stairs at PLOF s limitation due to symptoms. 03/27/2022 INITIAL  6 Patient will demonstrate bilateral SLS > 30 seconds s deviation.  03/27/2022 INITIAL  7 Pt will demonstrate ascending/descending stairs reciprocal gait pattern s UE assist for household navigation.   03/27/2022 INITIAL   PLAN: PT FREQUENCY: 2x/week  PT DURATION: 10 weeks  PLANNED INTERVENTIONS: Therapeutic exercises, Therapeutic activity, Neuro Muscular re-education, Balance training, Gait training, Patient/Family education, Joint mobilization, Stair training, DME instructions, Dry Needling, Electrical stimulation, Cryotherapy, Moist heat, Taping, Ultrasound, Ionotophoresis 4mg /ml Dexamethasone, and Manual therapy.  All included unless contraindicated  PLAN FOR NEXT SESSION: HEP review, painfree isometric flexion/ext Rt knee various positions, BFR use.  no open chain loaded exercise.   , PT, DPT, OCS, ATC 01/16/22  12:35 PM

## 2022-01-18 DIAGNOSIS — S83241A Other tear of medial meniscus, current injury, right knee, initial encounter: Secondary | ICD-10-CM

## 2022-01-18 DIAGNOSIS — S83511A Sprain of anterior cruciate ligament of right knee, initial encounter: Secondary | ICD-10-CM

## 2022-01-20 ENCOUNTER — Other Ambulatory Visit: Payer: Self-pay

## 2022-01-20 ENCOUNTER — Encounter: Payer: Self-pay | Admitting: Rehabilitative and Restorative Service Providers"

## 2022-01-20 ENCOUNTER — Ambulatory Visit (INDEPENDENT_AMBULATORY_CARE_PROVIDER_SITE_OTHER): Payer: No Typology Code available for payment source | Admitting: Rehabilitative and Restorative Service Providers"

## 2022-01-20 DIAGNOSIS — M25561 Pain in right knee: Secondary | ICD-10-CM

## 2022-01-20 DIAGNOSIS — R262 Difficulty in walking, not elsewhere classified: Secondary | ICD-10-CM | POA: Diagnosis not present

## 2022-01-20 DIAGNOSIS — M25661 Stiffness of right knee, not elsewhere classified: Secondary | ICD-10-CM

## 2022-01-20 DIAGNOSIS — R600 Localized edema: Secondary | ICD-10-CM

## 2022-01-20 DIAGNOSIS — M6281 Muscle weakness (generalized): Secondary | ICD-10-CM

## 2022-01-20 NOTE — Therapy (Signed)
OUTPATIENT PHYSICAL THERAPY TREATMENT NOTE   Patient Name: Leroy Griffith MRN: [id number] DOB:[birth year], 36 y.o., male Today's Date: 01/20/2022  PCP: Oneita Hurt, No REFERRING PROVIDER: Cammy Copa, MD   PT End of Session - 01/20/22 1306     Visit Number 2    Number of Visits 20    Date for PT Re-Evaluation 03/27/22    Authorization Type Workers Compensation    Authorization - Visit Number 2    Authorization - Number of Visits 16    Progress Note Due on Visit --    PT Start Time 1300    PT Stop Time 1350    PT Time Calculation (min) 50 min    Activity Tolerance Patient tolerated treatment well    Behavior During Therapy WFL for tasks assessed/performed             Past Medical History:  Diagnosis Date   GERD (gastroesophageal reflux disease)    Sleep apnea    Past Surgical History:  Procedure Laterality Date   ANTERIOR CRUCIATE LIGAMENT REPAIR Right 01/07/2022   Procedure: RIGHT KNEE ANTERIOR CRUCIATE LIGAMENT RECONSTRUCTION QUAD AUTOGRAFT, PARTIAL MEDIAL MENISCECTOMY;  Surgeon: Cammy Copa, MD;  Location: [facility] OR;  Service: Orthopedics;  Laterality: Right;   Patient Active Problem List   Diagnosis Date Noted   Rupture of anterior cruciate ligament of right knee    Acute medial meniscus tear of right knee     REFERRING PROVIDER: Cammy Copa, MD   REFERRING DIAG: [phone] (ICD-10-CM) - Rupture of anterior cruciate ligament of right knee, initial encounter  THERAPY DIAG:  Acute pain of right knee  Muscle weakness (generalized)  Stiffness of right knee, not elsewhere classified  Difficulty in walking, not elsewhere classified  Localized edema  PERTINENT HISTORY: unremarkable  PRECAUTIONS: Knee : no loaded open chain exercise, leg press/squat strengthening 0-60 degrees for first 4-6 weeks   WEIGHT BEARING RESTRICTIONS Continue knee immobilizer for ambulation until he can do 15 straight leg raises.   SUBJECTIVE: Patient indicated tightness with  heel lifts and difficulty c LAQ still noted.  Patient indicated pain at worst at 7/10 in last few days.   PAIN:  Are you having pain? Yes NPRS scale: 7/10 (at worst).  Pain location: Rt knee  Pain orientation: Right  PAIN TYPE: surgery  Pain description: tingling foot, patellar/quad pain noted pulling Aggravating factors: straightening knee and bending knee to end ranges.  Relieving factors: rest, mid range movement      OBJECTIVE:        PATIENT SURVEYS:  FOTO 01/16/2022 intake:  43 predicted:  75   COGNITION:          01/16/2022: Overall cognitive status: Within functional limits for tasks assessed                            SENSATION:          01/16/2022: Light touch: Appears intact   Edema/Atrophy:          01/16/2022: jt measurement Lt: 14.5 inch   Rt: 16.5 inch                          Quad 6 inches proximal to superior patellar border Lt: 21 inch    Rt: 20.5 inch     PALPATION: 01/16/2022: Mild tenderness around incisions, distal quad insertion   LE AROM/PROM:   A/PROM Right 01/16/2022 Left 01/16/2022  Hip flexion      Hip extension      Hip abduction      Hip adduction      Hip internal rotation      Hip external rotation      Knee flexion AROM in supine heel slide: 90   PROM in supine heel slide: 93 AROM in supine heel slide: 146  Knee extension AROM in LAQ: -50 c pain noted PROM in supine heel prop -2   AROM in LAQ:  2 degrees hyperextension    Ankle dorsiflexion      Ankle plantarflexion      Ankle inversion      Ankle eversion       (Blank rows = not tested)   LE MMT:   MMT   Right 01/16/2022 Left 01/16/2022  Hip flexion 4/5 5/5  Hip extension      Hip abduction      Hip adduction      Hip internal rotation      Hip external rotation      Knee flexion 2/5 5/5  Knee extension 1+ 5/5  Ankle dorsiflexion 5/5 5/5  Ankle plantarflexion      Ankle inversion 5/5 5/5  Ankle eversion 5/5 5/5   (Blank rows = not tested)       FUNCTIONAL TESTS:   01/16/2022:  Rt SLS: unable   GAIT: 01/20/2022: Immobilizer Rt knee c no crutches noted   01/16/2022 Assistive device utilized: axillary crutch Lt side, immobilizer Rt knee Level of assistance: Modified independence Comments: Decreased stance on Rt leg, immobilizer limits as expected.        TODAY'S TREATMENT: 01/20/2022:  BFR supine : 196 mmHg LOP, cuff size 4 Rt leg  Therex:       Nustep Lvl 5 8 mins UE/LE (seat 8)   Supine quad set 5 sec on/off 5 mins c BFR 147 mmHg   Seated isometric alternating 10 sec quad/10 sec hamstring in 60 deg knee flexion c BFR 147 mmHg 5 mins   Seated LAQ c BFR 147 mmHg x20, x 20   Vasopneumatic device 10 mins medium 34 degrees Rt knee  01/16/2022:  Therex:      HEP instruction/performance c cues for techniques, handout provided.  Trial set performed of each for comprehension and symptom assessment.  See below for HEP.  Cues for ankle pumps during elevation times for swelling control.  Vasopneumatic device 10 mins medium 34 degrees Rt knee       PATIENT EDUCATION:  01/16/2022:  Education details: HEP, POC, precautions Person educated: Patient Education method: Programmer, multimedia, Demonstration, Verbal cues, and Handouts Education comprehension: verbalized understanding and returned demonstration     HOME EXERCISE PROGRAM: Access Code: [email or web address]      ASSESSMENT:   CLINICAL IMPRESSION: Patient demonstrated improved activation of quad and reduced terminal knee extension lag in SLR but still noted around 10-15 degrees range with easy fatigue.  Good tolerance to use of BFR while in clinic today.  Patient to benefit from continued skilled PT services at this time.  Of note, workers comp approved 16 visits which today was #2.   REHAB POTENTIAL: Good   CLINICAL DECISION MAKING: Stable/uncomplicated   EVALUATION COMPLEXITY: Low     GOALS: Goals reviewed with patient? Yes   SHORT TERM GOALS:  assessed 01/20/2022 STG Name Target Date Goal status  1 Patient  will demonstrate independent use of home exercise program to maintain progress from in clinic treatments.   02/06/2022  On going  2 Patient will demonstrate safe independent ambulation community distances  02/06/2022 On going                                                 LONG TERM GOALS:    LTG Name Target Date Goal status  1 Patient will demonstrate/report pain at worst less than or equal to 2/10 to facilitate minimal limitation in daily activity secondary to pain symptoms.  03/27/2022 INITIAL  2 Patient will demonstrate independent use of home exercise program to facilitate ability to maintain/progress functional gains from skilled physical therapy services.  03/27/2022 INITIAL  3 Patient will demonstrate FOTO outcome > or = 75% to indicated reduced disability due to condition. 03/27/2022 INITIAL  4 Patient will demonstrate Rt  knee AROM 0-120 degrees to facilitate ability to perform transfers, sitting, ambulation, stair navigation s restriction due to mobility. 03/27/2022 INITIAL  5 Patient will demonstrate Rt LE MMT 5/5, within 15% dynamometry readings throughout to facilitate ability to perform usual standing, walking, stairs at PLOF s limitation due to symptoms. 03/27/2022 INITIAL  6 Patient will demonstrate bilateral SLS > 30 seconds s deviation.  03/27/2022 INITIAL  7 Pt will demonstrate ascending/descending stairs reciprocal gait pattern s UE assist for household navigation.    03/27/2022 INITIAL    PLAN: PT FREQUENCY: 2x/week   PT DURATION: 10 weeks   PLANNED INTERVENTIONS: Therapeutic exercises, Therapeutic activity, Neuro Muscular re-education, Balance training, Gait training, Patient/Family education, Joint mobilization, Stair training, DME instructions, Dry Needling, Electrical stimulation, Cryotherapy, Moist heat, Taping, Ultrasound, Ionotophoresis 4mg /ml Dexamethasone, and Manual therapy.  All included unless contraindicated   PLAN FOR NEXT SESSION: painfree isometric flexion/ext  Rt knee various positions, BFR use.  SLR non BFR (due to difficulty level).  no open chain loaded exercise.   , PT, DPT, OCS, ATC 01/20/22  1:36 PM

## 2022-01-23 ENCOUNTER — Ambulatory Visit (INDEPENDENT_AMBULATORY_CARE_PROVIDER_SITE_OTHER): Payer: PRIVATE HEALTH INSURANCE | Admitting: Rehabilitative and Restorative Service Providers"

## 2022-01-23 ENCOUNTER — Other Ambulatory Visit: Payer: Self-pay

## 2022-01-23 ENCOUNTER — Encounter: Payer: Self-pay | Admitting: Rehabilitative and Restorative Service Providers"

## 2022-01-23 DIAGNOSIS — M25561 Pain in right knee: Secondary | ICD-10-CM | POA: Diagnosis not present

## 2022-01-23 DIAGNOSIS — R262 Difficulty in walking, not elsewhere classified: Secondary | ICD-10-CM | POA: Diagnosis not present

## 2022-01-23 DIAGNOSIS — M25661 Stiffness of right knee, not elsewhere classified: Secondary | ICD-10-CM | POA: Diagnosis not present

## 2022-01-23 DIAGNOSIS — R600 Localized edema: Secondary | ICD-10-CM

## 2022-01-23 DIAGNOSIS — M6281 Muscle weakness (generalized): Secondary | ICD-10-CM

## 2022-01-23 NOTE — Therapy (Signed)
OUTPATIENT PHYSICAL THERAPY TREATMENT NOTE   Patient Name: Leroy Griffith MRN: [id number] DOB:[birth year], 36 y.o., male Today's Date: 01/23/2022  PCP: Oneita Hurt, No REFERRING PROVIDER: Cammy Copa, MD   PT End of Session - 01/23/22 1147     Visit Number 3    Number of Visits 20    Date for PT Re-Evaluation 03/27/22    Authorization Type Workers Compensation    Authorization - Visit Number 3    Authorization - Number of Visits 16    PT Start Time 1142    PT Stop Time 1233    PT Time Calculation (min) 51 min    Activity Tolerance Patient tolerated treatment well    Behavior During Therapy WFL for tasks assessed/performed              Past Medical History:  Diagnosis Date   GERD (gastroesophageal reflux disease)    Sleep apnea    Past Surgical History:  Procedure Laterality Date   ANTERIOR CRUCIATE LIGAMENT REPAIR Right 01/07/2022   Procedure: RIGHT KNEE ANTERIOR CRUCIATE LIGAMENT RECONSTRUCTION QUAD AUTOGRAFT, PARTIAL MEDIAL MENISCECTOMY;  Surgeon: Cammy Copa, MD;  Location: [facility];  Service: Orthopedics;  Laterality: Right;   Patient Active Problem List   Diagnosis Date Noted   Rupture of anterior cruciate ligament of right knee    Acute medial meniscus tear of right knee     REFERRING PROVIDER: Cammy Copa, MD   REFERRING DIAG: [phone] (ICD-10-CM) - Rupture of anterior cruciate ligament of right knee, initial encounter  THERAPY DIAG:  Acute pain of right knee  Muscle weakness (generalized)  Stiffness of right knee, not elsewhere classified  Difficulty in walking, not elsewhere classified  Localized edema  PERTINENT HISTORY: unremarkable  PRECAUTIONS: Knee : no loaded open chain exercise, leg press/squat strengthening 0-60 degrees for first 4-6 weeks   WEIGHT BEARING RESTRICTIONS Continue knee immobilizer for ambulation until he can do 15 straight leg raises.   SUBJECTIVE: Patient indicated having discomfort in back of knee when trying  to get knee straight.  No pain upon arrival.   PAIN:  Are you having pain? No pain at rest NPRS scale:0-10 Pain location: Rt knee  Pain orientation: Right  PAIN TYPE: surgery  Pain description: back of knee pulling noted  Aggravating factors: straightening knee and bending knee to end ranges.  Relieving factors: rest, medicine       OBJECTIVE:        PATIENT SURVEYS:  FOTO 01/16/2022 intake:  43 predicted:  75   COGNITION:          01/16/2022: Overall cognitive status: Within functional limits for tasks assessed                            SENSATION:          01/16/2022: Light touch: Appears intact   Edema/Atrophy:          01/16/2022: jt measurement Lt: 14.5 inch   Rt: 16.5 inch                          Quad 6 inches proximal to superior patellar border Lt: 21 inch    Rt: 20.5 inch     PALPATION: 01/16/2022: Mild tenderness around incisions, distal quad insertion   LE AROM/PROM:   A/PROM Right 01/16/2022 Left 01/16/2022  Hip flexion      Hip extension  Hip abduction      Hip adduction      Hip internal rotation      Hip external rotation      Knee flexion AROM in supine heel slide: 90   PROM in supine heel slide: 93 AROM in supine heel slide: 146  Knee extension AROM in LAQ: -50 c pain noted PROM in supine heel prop -2   AROM in LAQ:  2 degrees hyperextension    Ankle dorsiflexion      Ankle plantarflexion      Ankle inversion      Ankle eversion       (Blank rows = not tested)   LE MMT:   MMT   Right 01/16/2022 Left 01/16/2022  Hip flexion 4/5 5/5  Hip extension      Hip abduction      Hip adduction      Hip internal rotation      Hip external rotation      Knee flexion 2/5 5/5  Knee extension 1+ 5/5  Ankle dorsiflexion 5/5 5/5  Ankle plantarflexion      Ankle inversion 5/5 5/5  Ankle eversion 5/5 5/5   (Blank rows = not tested)       FUNCTIONAL TESTS:  01/16/2022:  Rt SLS: unable   GAIT: 01/20/2022: Immobilizer Rt knee c no crutches  noted   01/16/2022 Assistive device utilized: axillary crutch Lt side, immobilizer Rt knee Level of assistance: Modified independence Comments: Decreased stance on Rt leg, immobilizer limits as expected.        TODAY'S TREATMENT: 01/23/2022:  BFR supine : 196 mmHg LOP, cuff size 4 Rt leg  Therex:       Nustep Lvl 5 6 mins UE/LE (seat 8)   Supine quad set 5 sec on/off 5 mins c BFR 147 mmHg   Supine SLR x 20 c intermittent rest breaks   Supine bridge 20x 2-3 sec hold    Seated isometric alternating 10 sec quad/10 sec hamstring in 60 deg knee flexion c BFR 147 mmHg 5 mins   Seated LAQ c BFR 147 mmHg x 30     (stretching into flexion as well)   Vasopneumatic device 10 mins medium 34 degrees Rt knee  01/20/2022:  BFR supine/seated : 196 mmHg LOP, cuff size 4 Rt leg  Therex:       Nustep Lvl 5 6 mins UE/LE (seat 8)   Supine quad set 5 sec on/off 5 mins c BFR 147 mmHg   Seated isometric alternating 10 sec quad/10 sec hamstring in 60 deg knee flexion c BFR 147 mmHg 5 mins   Seated LAQ c BFR 147 mmHg x20, x 20   Vasopneumatic device 10 mins medium 34 degrees Rt knee  01/16/2022:  Therex:      HEP instruction/performance c cues for techniques, handout provided.  Trial set performed of each for comprehension and symptom assessment.  See below for HEP.  Cues for ankle pumps during elevation times for swelling control.  Vasopneumatic device 10 mins medium 34 degrees Rt knee       PATIENT EDUCATION:  01/16/2022:  Education details: HEP, POC, precautions Person educated: Patient Education method: Explanation, Demonstration, Verbal cues, and Handouts Education comprehension: verbalized understanding and returned demonstration     HOME EXERCISE PROGRAM: Access Code: [email or web address]      ASSESSMENT:   CLINICAL IMPRESSION: Improvement in SLR continued at this time c visual -5 lag noted in SLR attempts in supine.  Continued importance of  improve quad strengthening to help progress ambulation  independence and progression in intervention towards balance progression.    REHAB POTENTIAL: Good   CLINICAL DECISION MAKING: Stable/uncomplicated   EVALUATION COMPLEXITY: Low     GOALS: Goals reviewed with patient? Yes   SHORT TERM GOALS:  assessed 01/20/2022 STG Name Target Date Goal status  1 Patient will demonstrate independent use of home exercise program to maintain progress from in clinic treatments.   02/06/2022 On going  2 Patient will demonstrate safe independent ambulation community distances  02/06/2022 On going                                                 LONG TERM GOALS:    LTG Name Target Date Goal status  1 Patient will demonstrate/report pain at worst less than or equal to 2/10 to facilitate minimal limitation in daily activity secondary to pain symptoms.  03/27/2022 INITIAL  2 Patient will demonstrate independent use of home exercise program to facilitate ability to maintain/progress functional gains from skilled physical therapy services.  03/27/2022 INITIAL  3 Patient will demonstrate FOTO outcome > or = 75% to indicated reduced disability due to condition. 03/27/2022 INITIAL  4 Patient will demonstrate Rt  knee AROM 0-120 degrees to facilitate ability to perform transfers, sitting, ambulation, stair navigation s restriction due to mobility. 03/27/2022 INITIAL  5 Patient will demonstrate Rt LE MMT 5/5, within 15% dynamometry readings throughout to facilitate ability to perform usual standing, walking, stairs at PLOF s limitation due to symptoms. 03/27/2022 INITIAL  6 Patient will demonstrate bilateral SLS > 30 seconds s deviation.  03/27/2022 INITIAL  7 Pt will demonstrate ascending/descending stairs reciprocal gait pattern s UE assist for household navigation.    03/27/2022 INITIAL    PLAN: PT FREQUENCY: 2x/week   PT DURATION: 10 weeks   PLANNED INTERVENTIONS: Therapeutic exercises, Therapeutic activity, Neuro Muscular re-education, Balance training, Gait  training, Patient/Family education, Joint mobilization, Stair training, DME instructions, Dry Needling, Electrical stimulation, Cryotherapy, Moist heat, Taping, Ultrasound, Ionotophoresis 4mg /ml Dexamethasone, and Manual therapy.  All included unless contraindicated   PLAN FOR NEXT SESSION: painfree isometric flexion/ext Rt knee various positions, BFR use c supine/seated activity.  no open chain loaded exercise.   , PT, DPT, OCS, ATC 01/23/22  12:27 PM

## 2022-01-28 ENCOUNTER — Other Ambulatory Visit: Payer: Self-pay

## 2022-01-28 ENCOUNTER — Encounter: Payer: Self-pay | Admitting: Rehabilitative and Restorative Service Providers"

## 2022-01-28 ENCOUNTER — Ambulatory Visit (INDEPENDENT_AMBULATORY_CARE_PROVIDER_SITE_OTHER): Payer: PRIVATE HEALTH INSURANCE | Admitting: Rehabilitative and Restorative Service Providers"

## 2022-01-28 DIAGNOSIS — R600 Localized edema: Secondary | ICD-10-CM

## 2022-01-28 DIAGNOSIS — M25561 Pain in right knee: Secondary | ICD-10-CM

## 2022-01-28 DIAGNOSIS — M25661 Stiffness of right knee, not elsewhere classified: Secondary | ICD-10-CM

## 2022-01-28 DIAGNOSIS — R262 Difficulty in walking, not elsewhere classified: Secondary | ICD-10-CM | POA: Diagnosis not present

## 2022-01-28 DIAGNOSIS — M6281 Muscle weakness (generalized): Secondary | ICD-10-CM | POA: Diagnosis not present

## 2022-01-28 NOTE — Therapy (Signed)
OUTPATIENT PHYSICAL THERAPY TREATMENT NOTE   Patient Name: Leroy Griffith MRN: [id number] DOB:1986-02-18, 36 y.o., male Today's Date: 01/28/2022  PCP: Merryl Hacker, No REFERRING PROVIDER: Meredith Pel, MD   PT End of Session - 01/28/22 1026     Visit Number 4    Number of Visits 20    Date for PT Re-Evaluation 03/27/22    Authorization Type Workers Compensation    Authorization - Visit Number 4    Authorization - Number of Visits 16    PT Start Time 1017    PT Stop Time 1108    PT Time Calculation (min) 51 min    Activity Tolerance Patient tolerated treatment well    Behavior During Therapy WFL for tasks assessed/performed               Past Medical History:  Diagnosis Date   GERD (gastroesophageal reflux disease)    Sleep apnea    Past Surgical History:  Procedure Laterality Date   ANTERIOR CRUCIATE LIGAMENT REPAIR Right 01/07/2022   Procedure: RIGHT KNEE ANTERIOR CRUCIATE LIGAMENT RECONSTRUCTION QUAD AUTOGRAFT, PARTIAL MEDIAL MENISCECTOMY;  Surgeon: Meredith Pel, MD;  Location: [place];  Service: Orthopedics;  Laterality: Right;   Patient Active Problem List   Diagnosis Date Noted   Rupture of anterior cruciate ligament of right knee    Acute medial meniscus tear of right knee     REFERRING PROVIDER: Meredith Pel, MD   REFERRING DIAG: [phone] (ICD-10-CM) - Rupture of anterior cruciate ligament of right knee, initial encounter  THERAPY DIAG:  Acute pain of right knee  Muscle weakness (generalized)  Stiffness of right knee, not elsewhere classified  Difficulty in walking, not elsewhere classified  Localized edema  PERTINENT HISTORY: unremarkable  PRECAUTIONS: Knee : no loaded open chain exercise, leg press/squat strengthening 0-60 degrees for first 4-6 weeks   WEIGHT BEARING RESTRICTIONS Continue knee immobilizer for ambulation until he can do 15 straight leg raises.   SUBJECTIVE: Patient indicated having discomfort in back of knee when  trying to get knee straight.  No pain upon arrival.   PAIN:  Are you having pain? No pain at rest NPRS scale:0-10 Pain location: Rt knee  Pain orientation: Right  PAIN TYPE: surgery  Pain description: back of knee pulling noted  Aggravating factors: straightening knee and bending knee to end ranges.  Relieving factors: rest, medicine       OBJECTIVE:        PATIENT SURVEYS:  FOTO 01/16/2022 intake:  43 predicted:  75   COGNITION:          01/16/2022: Overall cognitive status: Within functional limits for tasks assessed                            SENSATION:          01/16/2022: Light touch: Appears intact   Edema/Atrophy:          01/16/2022: jt measurement Lt: 14.5 inch   Rt: 16.5 inch                          Quad 6 inches proximal to superior patellar border Lt: 21 inch    Rt: 20.5 inch     PALPATION: 01/16/2022: Mild tenderness around incisions, distal quad insertion   LE AROM/PROM:   A/PROM Right 01/16/2022 Left 01/16/2022 Right 01/28/2022  Hip flexion       Hip extension  Hip abduction       Hip adduction       Hip internal rotation       Hip external rotation       Knee flexion AROM in supine heel slide: 90   PROM in supine heel slide: 93 AROM in supine heel slide: 146 AROM in supine heel slide: 114 degrees  Knee extension AROM in LAQ: -50 c pain noted PROM in supine heel prop -2   AROM in LAQ:  2 degrees hyperextension   AROM in sitting LAQ: -5 degrees  Ankle dorsiflexion       Ankle plantarflexion       Ankle inversion       Ankle eversion        (Blank rows = not tested)   LE MMT:   MMT   Right 01/16/2022 Left 01/16/2022 Right 01/28/2022  Hip flexion 4/5 5/5   Hip extension       Hip abduction       Hip adduction       Hip internal rotation       Hip external rotation       Knee flexion 2/5 5/5   Knee extension 1+ 5/5 3/5 (no resistance testing today)  Ankle dorsiflexion 5/5 5/5   Ankle plantarflexion       Ankle inversion 5/5 5/5   Ankle  eversion 5/5 5/5    (Blank rows = not tested)       FUNCTIONAL TESTS:  01/28/2022: able to perform SLR x 15 c good control( Requirement noted from MD note for ambulation without immobilizer  01/16/2022:  Rt SLS: unable   GAIT: 01/28/2022: Ambulation into clinic without immobilizer, decreased stance on Lt, lacking full TKE in terminal swing  01/20/2022: Immobilizer Rt knee c no crutches noted  01/16/2022 Assistive device utilized: axillary crutch Lt side, immobilizer Rt knee Level of assistance: Modified independence Comments: Decreased stance on Rt leg, immobilizer limits as expected.        TODAY'S TREATMENT: 01/28/2022:  BFR supine : 196 mmHg LOP, cuff size 4 Rt leg  Therex:       Recumbent bike Lvl 1 5 mins seat 6    Supine SLR x 15     Seated SLR 3 x 10 Rt (cues for home use)   Seated isometric alternating 10 sec quad/10 sec hamstring in 60 deg knee flexion c BFR 147 mmHg 5 mins   Seated LAQ c BFR 147 mmHg x 30, 3 x 15 c 30 sec breaks    Sit to stand to side 20 inch chair BFR 147 mmHg 2 x 15  Neuro re-ed: tandem stance 1 min x 1 bilateral on airex foam, reverse ambulation c focus on loading Rt leg control (in // bars 10 ft x 6)  Vasopneumatic device 10 mins medium 34 degrees Rt knee  01/23/2022:  BFR supine : 196 mmHg LOP, cuff size 4 Rt leg  Therex:       Nustep Lvl 5 6 mins UE/LE (seat 8)   Supine quad set 5 sec on/off 5 mins c BFR 147 mmHg   Supine SLR x 20 c intermittent rest breaks   Supine bridge 20x 2-3 sec hold    Seated isometric alternating 10 sec quad/10 sec hamstring in 60 deg knee flexion c BFR 147 mmHg 5 mins   Seated LAQ c BFR 147 mmHg x 30     (stretching into flexion as well)   Vasopneumatic device 10 mins medium  34 degrees Rt knee  01/20/2022:  BFR supine/seated : 196 mmHg LOP, cuff size 4 Rt leg  Therex:       Nustep Lvl 5 6 mins UE/LE (seat 8)   Supine quad set 5 sec on/off 5 mins c BFR 147 mmHg   Seated isometric alternating 10 sec quad/10 sec  hamstring in 60 deg knee flexion c BFR 147 mmHg 5 mins   Seated LAQ c BFR 147 mmHg x20, x 20   Vasopneumatic device 10 mins medium 34 degrees Rt knee  01/16/2022:  Therex:      HEP instruction/performance c cues for techniques, handout provided.  Trial set performed of each for comprehension and symptom assessment.  See below for HEP.  Cues for ankle pumps during elevation times for swelling control.  Vasopneumatic device 10 mins medium 34 degrees Rt knee       PATIENT EDUCATION:  01/16/2022:  Education details: HEP, POC, precautions Person educated: Patient Education method: Explanation, Demonstration, Verbal cues, and Handouts Education comprehension: verbalized understanding and returned demonstration     HOME EXERCISE PROGRAM: Access Code: [email or web address]      ASSESSMENT:   CLINICAL IMPRESSION: Patient has continued to demonstrate improvement in quad activation and range of Rt knee.  Able to perform SLR at much better quality and closer to full end range.   AROM measurements improved as noted in objective data chart.  Continued skilled PT services indicated at this time to progress towards goals.    REHAB POTENTIAL: Good   CLINICAL DECISION MAKING: Stable/uncomplicated   EVALUATION COMPLEXITY: Low     GOALS: Goals reviewed with patient? Yes   SHORT TERM GOALS:  assessed 01/20/2022 STG Name Target Date Goal status  1 Patient will demonstrate independent use of home exercise program to maintain progress from in clinic treatments.   02/06/2022 Met  2 Patient will demonstrate safe independent ambulation community distances  02/06/2022 On going                                                 LONG TERM GOALS:    LTG Name Target Date Goal status  1 Patient will demonstrate/report pain at worst less than or equal to 2/10 to facilitate minimal limitation in daily activity secondary to pain symptoms.  03/27/2022 INITIAL  2 Patient will demonstrate independent use of home exercise  program to facilitate ability to maintain/progress functional gains from skilled physical therapy services.  03/27/2022 INITIAL  3 Patient will demonstrate FOTO outcome > or = 75% to indicated reduced disability due to condition. 03/27/2022 INITIAL  4 Patient will demonstrate Rt  knee AROM 0-120 degrees to facilitate ability to perform transfers, sitting, ambulation, stair navigation s restriction due to mobility. 03/27/2022 INITIAL  5 Patient will demonstrate Rt LE MMT 5/5, within 15% dynamometry readings throughout to facilitate ability to perform usual standing, walking, stairs at PLOF s limitation due to symptoms. 03/27/2022 INITIAL  6 Patient will demonstrate bilateral SLS > 30 seconds s deviation.  03/27/2022 INITIAL  7 Pt will demonstrate ascending/descending stairs reciprocal gait pattern s UE assist for household navigation.    03/27/2022 INITIAL    PLAN: PT FREQUENCY: 2x/week   PT DURATION: 10 weeks   PLANNED INTERVENTIONS: Therapeutic exercises, Therapeutic activity, Neuro Muscular re-education, Balance training, Gait training, Patient/Family education, Joint mobilization, Stair training, DME instructions, Dry Needling, Electrical  stimulation, Cryotherapy, Moist heat, Taping, Ultrasound, Ionotophoresis 89m/ml Dexamethasone, and Manual therapy.  All included unless contraindicated   PLAN FOR NEXT SESSION: painfree isometric flexion/ext Rt knee various positions continued, leg press c BFR, BFR use c supine/seated activity.  no open chain loaded exercise.   MScot Jun PT, DPT, OCS, ATC 01/28/22  10:58 AM

## 2022-01-29 ENCOUNTER — Encounter: Payer: Self-pay | Admitting: Orthopedic Surgery

## 2022-01-29 ENCOUNTER — Ambulatory Visit (INDEPENDENT_AMBULATORY_CARE_PROVIDER_SITE_OTHER): Payer: PRIVATE HEALTH INSURANCE | Admitting: Surgical

## 2022-01-29 ENCOUNTER — Other Ambulatory Visit: Payer: Self-pay | Admitting: Surgical

## 2022-01-29 DIAGNOSIS — S83511A Sprain of anterior cruciate ligament of right knee, initial encounter: Secondary | ICD-10-CM

## 2022-01-29 NOTE — Progress Notes (Signed)
° °  Post-Op Visit Note   Patient: Leroy Griffith           Date of Birth: September 23, 1986           MRN: [id number] Visit Date: 01/29/2022 PCP: Pcp, No   Assessment & Plan:  Chief Complaint:  Chief Complaint  Patient presents with   Right Knee - Routine Post Op    01/07/22 right knee ACL reconstruction with PMM   Visit Diagnoses:  1. Rupture of anterior cruciate ligament of right knee, initial encounter     Plan: Patient is a 36 year old male who presents s/p right knee anterior cruciate ligament reconstruction with quadricep autograft and partial posterior medial meniscectomy on 01/07/2022.  He reports that he is doing well overall and progressing steadily.  Denies any fevers, chills, night sweats, chest pain, shortness of breath.  Incisions are healing well and do have some surrounding erythema but he has not noticed any drainage from the incisions.  He is in physical therapy 2 times per week where he has reached 114 degrees of flexion.  Ambulating full weightbearing with the use of knee immobilizer.  He is able to do a straight leg raise without the knee immobilizer.  Takes Tylenol 2-3 times per day with Celebrex twice per day and gabapentin 3 times per day.  On exam, patient has 3 degrees extension and 110 degrees of knee flexion.  No effusion noted.  Incisions are healing well without evidence of infection or dehiscence though there is a little bit of surrounding erythema around the proximal/lateral incision and the lateral portal incision.  However, with elevation of the extremity, this redness significantly improved and almost resolved so no significant concern for infection.  There is no expressible drainage.  ACL graft is stable on Lachman exam.  He is able to perform straight leg raise multiple times without extensor lag with excellent quad strength considering where he is in recovery rated 5 -/5.  No calf tenderness.  Negative Homans' sign.  Plan is continue with physical therapy.  Follow-up  in 4 weeks for clinical recheck with Dr. August Saucer.  Monitor the incisions for any signs of infection but this seems to be more dependent rubor than any true cellulitis.  Follow-Up Instructions: No follow-ups on file.   Orders:  No orders of the defined types were placed in this encounter.  No orders of the defined types were placed in this encounter.   Imaging: No results found.  PMFS History: Patient Active Problem List   Diagnosis Date Noted   Rupture of anterior cruciate ligament of right knee    Acute medial meniscus tear of right knee    Past Medical History:  Diagnosis Date   GERD (gastroesophageal reflux disease)    Sleep apnea     No family history on file.  Past Surgical History:  Procedure Laterality Date   ANTERIOR CRUCIATE LIGAMENT REPAIR Right 01/07/2022   Procedure: RIGHT KNEE ANTERIOR CRUCIATE LIGAMENT RECONSTRUCTION QUAD AUTOGRAFT, PARTIAL MEDIAL MENISCECTOMY;  Surgeon: Cammy Copa, MD;  Location: [facility];  Service: Orthopedics;  Laterality: Right;   Social History   Occupational History   Not on file  Tobacco Use   Smoking status: Never   Smokeless tobacco: Never  Vaping Use   Vaping Use: Never used  Substance and Sexual Activity   Alcohol use: Yes    Comment: occassionally   Drug use: Never   Sexual activity: Not on file

## 2022-01-30 ENCOUNTER — Other Ambulatory Visit: Payer: Self-pay

## 2022-01-30 ENCOUNTER — Ambulatory Visit (INDEPENDENT_AMBULATORY_CARE_PROVIDER_SITE_OTHER): Payer: PRIVATE HEALTH INSURANCE | Admitting: Rehabilitative and Restorative Service Providers"

## 2022-01-30 ENCOUNTER — Encounter: Payer: Self-pay | Admitting: Rehabilitative and Restorative Service Providers"

## 2022-01-30 DIAGNOSIS — R262 Difficulty in walking, not elsewhere classified: Secondary | ICD-10-CM | POA: Diagnosis not present

## 2022-01-30 DIAGNOSIS — M25661 Stiffness of right knee, not elsewhere classified: Secondary | ICD-10-CM

## 2022-01-30 DIAGNOSIS — M25561 Pain in right knee: Secondary | ICD-10-CM

## 2022-01-30 DIAGNOSIS — M6281 Muscle weakness (generalized): Secondary | ICD-10-CM

## 2022-01-30 DIAGNOSIS — R600 Localized edema: Secondary | ICD-10-CM

## 2022-01-30 NOTE — Therapy (Signed)
OUTPATIENT PHYSICAL THERAPY TREATMENT NOTE   Patient Name: Leroy Griffith MRN: [id number] DOB:[birth year], 36 y.o., male Today's Date: 01/30/2022  PCP: Merryl Hacker, No REFERRING PROVIDER: Meredith Pel, MD   PT End of Session - 01/30/22 1023     Visit Number 5    Number of Visits 20    Date for PT Re-Evaluation 03/27/22    Authorization Type Workers Compensation    Authorization - Visit Number 5    Authorization - Number of Visits 16    PT Start Time 21    PT Stop Time 1107    PT Time Calculation (min) 48 min    Activity Tolerance Patient tolerated treatment well    Behavior During Therapy WFL for tasks assessed/performed                Past Medical History:  Diagnosis Date   GERD (gastroesophageal reflux disease)    Sleep apnea    Past Surgical History:  Procedure Laterality Date   ANTERIOR CRUCIATE LIGAMENT REPAIR Right 01/07/2022   Procedure: RIGHT KNEE ANTERIOR CRUCIATE LIGAMENT RECONSTRUCTION QUAD AUTOGRAFT, PARTIAL MEDIAL MENISCECTOMY;  Surgeon: Meredith Pel, MD;  Location: [facility];  Service: Orthopedics;  Laterality: Right;   Patient Active Problem List   Diagnosis Date Noted   Rupture of anterior cruciate ligament of right knee    Acute medial meniscus tear of right knee     REFERRING PROVIDER: Meredith Pel, MD   REFERRING DIAG: [id number] (ICD-10-CM) - Rupture of anterior cruciate ligament of right knee, initial encounter  THERAPY DIAG:  Acute pain of right knee  Muscle weakness (generalized)  Stiffness of right knee, not elsewhere classified  Difficulty in walking, not elsewhere classified  Localized edema  PERTINENT HISTORY: unremarkable  PRECAUTIONS: Knee : no loaded open chain exercise, leg press/squat strengthening 0-60 degrees for first 4-6 weeks   WEIGHT BEARING RESTRICTIONS Continue knee immobilizer for ambulation until he can do 15 straight leg raises.   SUBJECTIVE: Pt indicated no pain today, just some pulling in knee c  flexion stretching.  Pt indicated immobilizer is gone now after visit c PA.   PAIN:  Are you having pain? No pain at rest NPRS scale:0/10 Pain location: Rt knee  Pain orientation: Right  PAIN TYPE: surgery  Pain description:  Aggravating factors:  Relieving factors:      OBJECTIVE:        PATIENT SURVEYS:  FOTO 01/16/2022 intake:  43 predicted:  75   COGNITION:          01/16/2022: Overall cognitive status: Within functional limits for tasks assessed                            SENSATION:          01/16/2022: Light touch: Appears intact   Edema/Atrophy:          01/16/2022: jt measurement Lt: 14.5 inch   Rt: 16.5 inch                          Quad 6 inches proximal to superior patellar border Lt: 21 inch    Rt: 20.5 inch     PALPATION: 01/16/2022: Mild tenderness around incisions, distal quad insertion   LE AROM/PROM:   A/PROM Right 01/16/2022 Left 01/16/2022 Right 01/28/2022  Hip flexion       Hip extension       Hip abduction  Hip adduction       Hip internal rotation       Hip external rotation       Knee flexion AROM in supine heel slide: 90   PROM in supine heel slide: 93 AROM in supine heel slide: 146 AROM in supine heel slide: 114 degrees  Knee extension AROM in LAQ: -50 c pain noted PROM in supine heel prop -2   AROM in LAQ:  2 degrees hyperextension   AROM in sitting LAQ: -5 degrees  Ankle dorsiflexion       Ankle plantarflexion       Ankle inversion       Ankle eversion        (Blank rows = not tested)   LE MMT:   MMT   Right 01/16/2022 Left 01/16/2022 Right 01/28/2022  Hip flexion 4/5 5/5   Hip extension       Hip abduction       Hip adduction       Hip internal rotation       Hip external rotation       Knee flexion 2/5 5/5   Knee extension 1+ 5/5 3/5 (no resistance testing today)  Ankle dorsiflexion 5/5 5/5   Ankle plantarflexion       Ankle inversion 5/5 5/5   Ankle eversion 5/5 5/5    (Blank rows = not tested)       FUNCTIONAL TESTS:   01/28/2022: able to perform SLR x 15 c good control( Requirement noted from MD note for ambulation without immobilizer  01/16/2022:  Rt SLS: unable   GAIT: 01/28/2022: Ambulation into clinic without immobilizer, decreased stance on Lt, lacking full TKE in terminal swing  01/20/2022: Immobilizer Rt knee c no crutches noted  01/16/2022 Assistive device utilized: axillary crutch Lt side, immobilizer Rt knee Level of assistance: Modified independence Comments: Decreased stance on Rt leg, immobilizer limits as expected.        TODAY'S TREATMENT: 01/30/2022:  BFR   : 196 mmHg LOP, cuff size 4 Rt leg  Therex:       UBE LE only lvl 5 6 mins    Leg press to 70 degrees knee flexion - double leg x 15, single leg 2 x 10 bilateral   37 lbs(add BFR next time c adjustment in weight)   Seated SLR 3 x 10 Rt c BFR 150 mmHg    Step up on Rt leg 4 inch x 15    Neuro re-ed:  fitter wobble board fwd/back 30 x   Retro step Rt leg posterior 20x   Tandem ambulation fwd/back on foam 8 ft x 6 each way  Vasopneumatic device 10 mins medium 34 degrees Rt knee  01/28/2022:  BFR supine : 196 mmHg LOP, cuff size 4 Rt leg  Therex:       Recumbent bike Lvl 1 5 mins seat 6    Supine SLR x 15     Seated SLR 3 x 10 Rt (cues for home use)   Seated isometric alternating 10 sec quad/10 sec hamstring in 60 deg knee flexion c BFR 147 mmHg 5 mins   Seated LAQ c BFR 147 mmHg x 30, 3 x 15 c 30 sec breaks    Sit to stand to side 20 inch chair BFR 147 mmHg 2 x 15  Neuro re-ed: tandem stance 1 min x 1 bilateral on airex foam, reverse ambulation c focus on loading Rt leg control (in // bars 10 ft x  6)  Vasopneumatic device 10 mins medium 34 degrees Rt knee  01/23/2022:  BFR supine : 196 mmHg LOP, cuff size 4 Rt leg  Therex:       Nustep Lvl 5 6 mins UE/LE (seat 8)   Supine quad set 5 sec on/off 5 mins c BFR 147 mmHg   Supine SLR x 20 c intermittent rest breaks   Supine bridge 20x 2-3 sec hold    Seated isometric  alternating 10 sec quad/10 sec hamstring in 60 deg knee flexion c BFR 147 mmHg 5 mins   Seated LAQ c BFR 147 mmHg x 30     (stretching into flexion as well)   Vasopneumatic device 10 mins medium 34 degrees Rt knee  01/20/2022:  BFR supine/seated : 196 mmHg LOP, cuff size 4 Rt leg  Therex:       Nustep Lvl 5 6 mins UE/LE (seat 8)   Supine quad set 5 sec on/off 5 mins c BFR 147 mmHg   Seated isometric alternating 10 sec quad/10 sec hamstring in 60 deg knee flexion c BFR 147 mmHg 5 mins   Seated LAQ c BFR 147 mmHg x20, x 20   Vasopneumatic device 10 mins medium 34 degrees Rt knee  01/16/2022:  Therex:      HEP instruction/performance c cues for techniques, handout provided.  Trial set performed of each for comprehension and symptom assessment.  See below for HEP.  Cues for ankle pumps during elevation times for swelling control.  Vasopneumatic device 10 mins medium 34 degrees Rt knee       PATIENT EDUCATION:  01/16/2022:  Education details: HEP, POC, precautions Person educated: Patient Education method: Consulting civil engineer, Demonstration, Verbal cues, and Handouts Education comprehension: verbalized understanding and returned demonstration     HOME EXERCISE PROGRAM: Access Code: [email or web address]      ASSESSMENT:   CLINICAL IMPRESSION: Progressed to include WB strengthening with no complaints of pain noted and good overall control.  Continue emphasis on improved strength and balance for Rt knee with no loaded open chain.    REHAB POTENTIAL: Good   CLINICAL DECISION MAKING: Stable/uncomplicated   EVALUATION COMPLEXITY: Low     GOALS: Goals reviewed with patient? Yes   SHORT TERM GOALS:  assessed 01/20/2022 STG Name Target Date Goal status  1 Patient will demonstrate independent use of home exercise program to maintain progress from in clinic treatments.   02/06/2022 Met  2 Patient will demonstrate safe independent ambulation community distances  02/06/2022 Met Assessed 01/30/2022                                                  LONG TERM GOALS:    LTG Name Target Date Goal status  1 Patient will demonstrate/report pain at worst less than or equal to 2/10 to facilitate minimal limitation in daily activity secondary to pain symptoms.  03/27/2022 INITIAL  2 Patient will demonstrate independent use of home exercise program to facilitate ability to maintain/progress functional gains from skilled physical therapy services.  03/27/2022 INITIAL  3 Patient will demonstrate FOTO outcome > or = 75% to indicated reduced disability due to condition. 03/27/2022 INITIAL  4 Patient will demonstrate Rt  knee AROM 0-120 degrees to facilitate ability to perform transfers, sitting, ambulation, stair navigation s restriction due to mobility. 03/27/2022 INITIAL  5 Patient will demonstrate Rt LE  MMT 5/5, within 15% dynamometry readings throughout to facilitate ability to perform usual standing, walking, stairs at PLOF s limitation due to symptoms. 03/27/2022 INITIAL  6 Patient will demonstrate bilateral SLS > 30 seconds s deviation.  03/27/2022 INITIAL  7 Pt will demonstrate ascending/descending stairs reciprocal gait pattern s UE assist for household navigation.    03/27/2022 INITIAL    PLAN: PT FREQUENCY: 2x/week   PT DURATION: 10 weeks   PLANNED INTERVENTIONS: Therapeutic exercises, Therapeutic activity, Neuro Muscular re-education, Balance training, Gait training, Patient/Family education, Joint mobilization, Stair training, DME instructions, Dry Needling, Electrical stimulation, Cryotherapy, Moist heat, Taping, Ultrasound, Ionotophoresis 29m/ml Dexamethasone, and Manual therapy.  All included unless contraindicated   PLAN FOR NEXT SESSION: leg press c BFR, BFR use c supine/seated activity.  no open chain loaded exercise.   MScot Jun PT, DPT, OCS, ATC 01/30/22  11:02 AM

## 2022-02-04 ENCOUNTER — Ambulatory Visit (INDEPENDENT_AMBULATORY_CARE_PROVIDER_SITE_OTHER): Payer: No Typology Code available for payment source | Admitting: Physical Therapy

## 2022-02-04 ENCOUNTER — Other Ambulatory Visit: Payer: Self-pay

## 2022-02-04 ENCOUNTER — Encounter: Payer: Self-pay | Admitting: Physical Therapy

## 2022-02-04 ENCOUNTER — Ambulatory Visit: Payer: No Typology Code available for payment source | Admitting: Mental Health

## 2022-02-04 DIAGNOSIS — M6281 Muscle weakness (generalized): Secondary | ICD-10-CM | POA: Diagnosis not present

## 2022-02-04 DIAGNOSIS — M25561 Pain in right knee: Secondary | ICD-10-CM

## 2022-02-04 DIAGNOSIS — M25661 Stiffness of right knee, not elsewhere classified: Secondary | ICD-10-CM | POA: Diagnosis not present

## 2022-02-04 DIAGNOSIS — R262 Difficulty in walking, not elsewhere classified: Secondary | ICD-10-CM

## 2022-02-04 DIAGNOSIS — R600 Localized edema: Secondary | ICD-10-CM

## 2022-02-04 NOTE — Therapy (Signed)
OUTPATIENT PHYSICAL THERAPY TREATMENT NOTE   Patient Name: Leroy Griffith MRN: [id number] DOB:[birth year], 36 y.o., male Today's Date: 02/04/2022  PCP: Merryl Hacker, No REFERRING PROVIDER: Meredith Pel, MD   PT End of Session - 02/04/22 [phone]     Visit Number 6    Number of Visits 20    Date for PT Re-Evaluation 03/27/22    Authorization Type Workers Compensation    Authorization - Visit Number 6    Authorization - Number of Visits 16    Progress Note Due on Visit 10    PT Start Time [phone]    PT Stop Time 0940    PT Time Calculation (min) 54 min    Activity Tolerance Patient tolerated treatment well    Behavior During Therapy WFL for tasks assessed/performed                 Past Medical History:  Diagnosis Date   GERD (gastroesophageal reflux disease)    Sleep apnea    Past Surgical History:  Procedure Laterality Date   ANTERIOR CRUCIATE LIGAMENT REPAIR Right 01/07/2022   Procedure: RIGHT KNEE ANTERIOR CRUCIATE LIGAMENT RECONSTRUCTION QUAD AUTOGRAFT, PARTIAL MEDIAL MENISCECTOMY;  Surgeon: Meredith Pel, MD;  Location: [facility];  Service: Orthopedics;  Laterality: Right;   Patient Active Problem List   Diagnosis Date Noted   Rupture of anterior cruciate ligament of right knee    Acute medial meniscus tear of right knee     REFERRING PROVIDER: Meredith Pel, MD   REFERRING DIAG: [phone] (ICD-10-CM) - Rupture of anterior cruciate ligament of right knee, initial encounter  THERAPY DIAG:  Acute pain of right knee  Muscle weakness (generalized)  Difficulty in walking, not elsewhere classified  Stiffness of right knee, not elsewhere classified  Localized edema  PERTINENT HISTORY: unremarkable  PRECAUTIONS: Knee : no loaded open chain exercise, leg press/squat strengthening 0-60 degrees for first 4-6 weeks   WEIGHT BEARING RESTRICTIONS Continue knee immobilizer for ambulation until he can do 15 straight leg raises.   SUBJECTIVE: Pt indicated 3/10 pain  in right knee today and stiffness reported when first waking up in the morning. PAIN:  Are you having pain? No pain at rest NPRS scale:3/10 Pain location: Rt knee  Pain orientation: Right  PAIN TYPE: surgery  Pain description:  Aggravating factors:  Relieving factors:      OBJECTIVE:        PATIENT SURVEYS:  FOTO 01/16/2022 intake:  43 predicted:  75   COGNITION:          01/16/2022: Overall cognitive status: Within functional limits for tasks assessed                            SENSATION:          01/16/2022: Light touch: Appears intact   Edema/Atrophy:          01/16/2022: jt measurement Lt: 14.5 inch   Rt: 16.5 inch                          Quad 6 inches proximal to superior patellar border Lt: 21 inch    Rt: 20.5 inch     PALPATION: 01/16/2022: Mild tenderness around incisions, distal quad insertion   LE AROM/PROM:   A/PROM Right 01/16/2022 Left 01/16/2022 Right 01/28/2022 Right 02/04/2022  Hip flexion        Hip extension  Hip abduction        Hip adduction        Hip internal rotation        Hip external rotation        Knee flexion AROM in supine heel slide: 90   PROM in supine heel slide: 93 AROM in supine heel slide: 146 AROM in supine heel slide: 114 degrees AROM in supine Heel slide: 115 degrees  Knee extension AROM in LAQ: -50 c pain noted PROM in supine heel prop -2   AROM in LAQ:  2 degrees hyperextension   AROM in sitting LAQ: -5 degrees AROM in supine -5 degrees  Ankle dorsiflexion        Ankle plantarflexion        Ankle inversion        Ankle eversion         (Blank rows = not tested)   LE MMT:   MMT   Right 01/16/2022 Left 01/16/2022 Right 01/28/2022  Hip flexion 4/5 5/5   Hip extension       Hip abduction       Hip adduction       Hip internal rotation       Hip external rotation       Knee flexion 2/5 5/5   Knee extension 1+ 5/5 3/5 (no resistance testing today)  Ankle dorsiflexion 5/5 5/5   Ankle plantarflexion       Ankle  inversion 5/5 5/5   Ankle eversion 5/5 5/5    (Blank rows = not tested)       FUNCTIONAL TESTS:  01/28/2022: able to perform SLR x 15 c good control( Requirement noted from MD note for ambulation without immobilizer  01/16/2022:  Rt SLS: unable   GAIT: 01/28/2022: Ambulation into clinic without immobilizer, decreased stance on Lt, lacking full TKE in terminal swing  01/20/2022: Immobilizer Rt knee c no crutches noted  01/16/2022 Assistive device utilized: axillary crutch Lt side, immobilizer Rt knee Level of assistance: Modified independence Comments: Decreased stance on Rt leg, immobilizer limits as expected.        Today's Treatment: 02/04/2022:   BFR   : 196 mmHg, exercise 150 mmHg LOP, cuff size 4 Rt leg  Therex:       Recumbent bike only lvl 4 x  8 mins seat at 6    Leg press to 70 degrees knee flexion - Rt LE only 37 lbs (BFR 165mHg: x 30, 3x15)    Seated SLR 30, 3 x 15 Rt c (BFR 150 mmHg)   Step up on Rt leg 6 inch step ( BFR 1569mg x30, 3x15)    Neuro re-ed:  fitter wobble board fwd/back 30 x   Retro step Rt leg posterior 20x   Tandem ambulation fwd/back on foam 8 ft x 6 each way  Vasopneumatic device 10 mins medium 34 degrees Rt knee     01/30/2022:  BFR   : 196 mmHg LOP, cuff size 4 Rt leg  Therex:       UBE LE only lvl 5 6 mins    Leg press to 70 degrees knee flexion - double leg x 15, single leg 2 x 10 bilateral   37 lbs(add BFR next time c adjustment in weight)   Seated SLR 3 x 10 Rt c BFR 150 mmHg    Step up on Rt leg 4 inch x 15    Neuro re-ed:  fitter wobble board fwd/back x 30  SLS on Airex Rt LE only 3x30 seconds with intermittent UE support as needed  Vasopneumatic device 10 mins medium 34 degrees Rt knee  01/28/2022:  BFR supine : 196 mmHg LOP, cuff size 4 Rt leg  Therex:       Recumbent bike Lvl 1 5 mins seat 6    Supine SLR x 15     Seated SLR 3 x 10 Rt (cues for home use)   Seated isometric alternating 10 sec quad/10 sec hamstring in  60 deg knee flexion c BFR 147 mmHg 5 mins   Seated LAQ c BFR 147 mmHg x 30, 3 x 15 c 30 sec breaks    Sit to stand to side 20 inch chair BFR 147 mmHg 2 x 15  Neuro re-ed: tandem stance 1 min x 1 bilateral on airex foam, reverse ambulation c focus on loading Rt leg control (in // bars 10 ft x 6)  Vasopneumatic device 10 mins medium 34 degrees Rt knee         PATIENT EDUCATION:  01/16/2022:  Education details: HEP, POC, precautions Person educated: Patient Education method: Consulting civil engineer, Demonstration, Verbal cues, and Handouts Education comprehension: verbalized understanding and returned demonstration     HOME EXERCISE PROGRAM: Access Code: [email or web address]      ASSESSMENT:   CLINICAL IMPRESSION: Progressed to include WB strengthening with no complaints of pain noted and good overall control.  Continue emphasis on improved strength and balance for Rt knee with no loaded open chain.    REHAB POTENTIAL: Good   CLINICAL DECISION MAKING: Stable/uncomplicated   EVALUATION COMPLEXITY: Low     GOALS: Goals reviewed with patient? Yes   SHORT TERM GOALS:  assessed 01/20/2022 STG Name Target Date Goal status  1 Patient will demonstrate independent use of home exercise program to maintain progress from in clinic treatments.   02/06/2022 Met  2 Patient will demonstrate safe independent ambulation community distances  02/06/2022 Met Assessed 01/30/2022                                                 LONG TERM GOALS:    LTG Name Target Date Goal status  1 Patient will demonstrate/report pain at worst less than or equal to 2/10 to facilitate minimal limitation in daily activity secondary to pain symptoms.  03/27/2022 Ongoing 02/04/2022  2 Patient will demonstrate independent use of home exercise program to facilitate ability to maintain/progress functional gains from skilled physical therapy services.  03/27/2022 Ongoing 02/04/2022  3 Patient will demonstrate FOTO outcome > or = 75% to  indicated reduced disability due to condition. 03/27/2022 Ongoing 02/04/2022  4 Patient will demonstrate Rt  knee AROM 0-120 degrees to facilitate ability to perform transfers, sitting, ambulation, stair navigation s restriction due to mobility. 03/27/2022 Ongoing 02/04/2022  5 Patient will demonstrate Rt LE MMT 5/5, within 15% dynamometry readings throughout to facilitate ability to perform usual standing, walking, stairs at PLOF s limitation due to symptoms. 03/27/2022 Ongoing 02/04/2022  6 Patient will demonstrate bilateral SLS > 30 seconds s deviation.  03/27/2022 Ongoing 02/04/2022  7 Pt will demonstrate ascending/descending stairs reciprocal gait pattern s UE assist for household navigation.    03/27/2022 Ongoing 02/04/2022    PLAN: PT FREQUENCY: 2x/week   PT DURATION: 10 weeks   PLANNED INTERVENTIONS: Therapeutic exercises, Therapeutic activity, Neuro Muscular re-education, Balance training, Gait  training, Patient/Family education, Joint mobilization, Stair training, DME instructions, Dry Needling, Electrical stimulation, Cryotherapy, Moist heat, Taping, Ultrasound, Ionotophoresis 54m/ml Dexamethasone, and Manual therapy.  All included unless contraindicated   PLAN FOR NEXT SESSION: leg press c BFR, BFR use c supine/seated activity.  no open chain loaded exercise.   JKearney Hard PT, MPT 02/04/22 9:32 AM

## 2022-02-06 ENCOUNTER — Encounter: Payer: Self-pay | Admitting: Rehabilitative and Restorative Service Providers"

## 2022-02-06 ENCOUNTER — Ambulatory Visit (INDEPENDENT_AMBULATORY_CARE_PROVIDER_SITE_OTHER): Payer: No Typology Code available for payment source | Admitting: Rehabilitative and Restorative Service Providers"

## 2022-02-06 ENCOUNTER — Other Ambulatory Visit: Payer: Self-pay

## 2022-02-06 DIAGNOSIS — R262 Difficulty in walking, not elsewhere classified: Secondary | ICD-10-CM | POA: Diagnosis not present

## 2022-02-06 DIAGNOSIS — M25561 Pain in right knee: Secondary | ICD-10-CM | POA: Diagnosis not present

## 2022-02-06 DIAGNOSIS — R600 Localized edema: Secondary | ICD-10-CM

## 2022-02-06 DIAGNOSIS — M6281 Muscle weakness (generalized): Secondary | ICD-10-CM | POA: Diagnosis not present

## 2022-02-06 DIAGNOSIS — M25661 Stiffness of right knee, not elsewhere classified: Secondary | ICD-10-CM | POA: Diagnosis not present

## 2022-02-06 NOTE — Therapy (Signed)
OUTPATIENT PHYSICAL THERAPY TREATMENT NOTE   Patient Name: Leroy Griffith MRN: [id number] DOB:23-Apr-1986, 36 y.o., male Today's Date: 02/06/2022  PCP: Merryl Hacker, No REFERRING PROVIDER: Meredith Pel, MD   PT End of Session - 02/06/22 0856     Visit Number 7    Number of Visits 20    Date for PT Re-Evaluation 03/27/22    Authorization Type Workers Compensation    Authorization - Visit Number 7    Authorization - Number of Visits 16    Progress Note Due on Visit 10    PT Start Time 0849    PT Stop Time 0939    PT Time Calculation (min) 50 min    Activity Tolerance Patient tolerated treatment well    Behavior During Therapy WFL for tasks assessed/performed                  Past Medical History:  Diagnosis Date   GERD (gastroesophageal reflux disease)    Sleep apnea    Past Surgical History:  Procedure Laterality Date   ANTERIOR CRUCIATE LIGAMENT REPAIR Right 01/07/2022   Procedure: RIGHT KNEE ANTERIOR CRUCIATE LIGAMENT RECONSTRUCTION QUAD AUTOGRAFT, PARTIAL MEDIAL MENISCECTOMY;  Surgeon: Meredith Pel, MD;  Location: [place];  Service: Orthopedics;  Laterality: Right;   Patient Active Problem List   Diagnosis Date Noted   Rupture of anterior cruciate ligament of right knee    Acute medial meniscus tear of right knee     REFERRING PROVIDER: Meredith Pel, MD   REFERRING DIAG: [phone] (ICD-10-CM) - Rupture of anterior cruciate ligament of right knee, initial encounter  THERAPY DIAG:  Acute pain of right knee  Muscle weakness (generalized)  Difficulty in walking, not elsewhere classified  Stiffness of right knee, not elsewhere classified  Localized edema  PERTINENT HISTORY: unremarkable  PRECAUTIONS: Knee : no loaded open chain exercise, leg press/squat strengthening 0-60 degrees for first 4-6 weeks   WEIGHT BEARING RESTRICTIONS None at this time.   SUBJECTIVE: Pt indicated no pain today upon arrival.  Stated stiffness in morning still noted.   Some clicking at times in walking PAIN:  Are you having pain? No pain at rest NPRS scale:0/10 Pain location: Rt knee  Pain orientation: Right  PAIN TYPE: surgery  Pain description:  Aggravating factors:  Relieving factors:      OBJECTIVE:        PATIENT SURVEYS:  FOTO 01/16/2022 intake:  43 predicted:  75   COGNITION:          01/16/2022: Overall cognitive status: Within functional limits for tasks assessed                            SENSATION:          01/16/2022: Light touch: Appears intact   Edema/Atrophy:          01/16/2022: jt measurement Lt: 14.5 inch   Rt: 16.5 inch                          Quad 6 inches proximal to superior patellar border Lt: 21 inch    Rt: 20.5 inch     PALPATION: 01/16/2022: Mild tenderness around incisions, distal quad insertion   LE AROM/PROM:   A/PROM Right 01/16/2022 Left 01/16/2022 Right 01/28/2022 Right 02/04/2022  Hip flexion        Hip extension        Hip abduction  Hip adduction        Hip internal rotation        Hip external rotation        Knee flexion AROM in supine heel slide: 90   PROM in supine heel slide: 93 AROM in supine heel slide: 146 AROM in supine heel slide: 114 degrees AROM in supine Heel slide: 115 degrees  Knee extension AROM in LAQ: -50 c pain noted PROM in supine heel prop -2   AROM in LAQ:  2 degrees hyperextension   AROM in sitting LAQ: -5 degrees AROM in supine -5 degrees  Ankle dorsiflexion        Ankle plantarflexion        Ankle inversion        Ankle eversion         (Blank rows = not tested)   LE MMT:   MMT   Right 01/16/2022 Left 01/16/2022 Right 01/28/2022  Hip flexion 4/5 5/5   Hip extension       Hip abduction       Hip adduction       Hip internal rotation       Hip external rotation       Knee flexion 2/5 5/5   Knee extension 1+ 5/5 3/5 (no resistance testing today)  Ankle dorsiflexion 5/5 5/5   Ankle plantarflexion       Ankle inversion 5/5 5/5   Ankle eversion 5/5 5/5     (Blank rows = not tested)       FUNCTIONAL TESTS:  01/28/2022: able to perform SLR x 15 c good control( Requirement noted from MD note for ambulation without immobilizer  01/16/2022:  Rt SLS: unable   GAIT: 01/28/2022: Ambulation into clinic without immobilizer, decreased stance on Lt, lacking full TKE in terminal swing  01/20/2022: Immobilizer Rt knee c no crutches noted  01/16/2022 Assistive device utilized: axillary crutch Lt side, immobilizer Rt knee Level of assistance: Modified independence Comments: Decreased stance on Rt leg, immobilizer limits as expected.        Today's Treatment:   02/06/2022:   BFR   : 196 mmHg, exercise 150 mmHg LOP, cuff size 4 Rt leg  Therex:       UBE LE only BFR 140 mmHg 5 mins 30 sec break 5 mins (10 mins total around 70-80 rpm lvl 4.5 to 5.5 adjustment to meet 6-7 RPE out of 10.    Leg press to 70 degrees knee flexion - Rt LE only 43 lbs (BFR 130mHg: x 30, 3x15)      Neuro re-ed:  fitter wobble board fwd/back 30 x   SLS c cone touches (anterior, anterior/lateral, anterior/medial) x 10 bilateral on foam   SLS c vector slider fwd/latera/reverse x 10 each bilateral   Tandem ambulation fwd/back on foam 8 ft x 6 each way  Vasopneumatic device 10 mins medium 34 degrees Rt knee  02/04/2022:   BFR   : 196 mmHg, exercise 150 mmHg LOP, cuff size 4 Rt leg  Therex:       Recumbent bike only lvl 4 x  8 mins seat at 6    Leg press to 70 degrees knee flexion - Rt LE only 37 lbs (BFR 1561mg: x 30, 3x15)    Seated SLR 30, 3 x 15 Rt c (BFR 150 mmHg)   Step up on Rt leg 6 inch step ( BFR 15026m x30, 3x15)    Neuro re-ed:  fitter wobble board fwd/back 30 x  Retro step Rt leg posterior 20x   Tandem ambulation fwd/back on foam 8 ft x 6 each way  Vasopneumatic device 10 mins medium 34 degrees Rt knee     01/30/2022:  BFR   : 196 mmHg LOP, cuff size 4 Rt leg  Therex:       UBE LE only lvl 5 6 mins    Leg press to 70 degrees knee flexion - double leg  x 15, single leg 2 x 10 bilateral   37 lbs(add BFR next time c adjustment in weight)   Seated SLR 3 x 10 Rt c BFR 150 mmHg    Step up on Rt leg 4 inch x 15    Neuro re-ed:  fitter wobble board fwd/back x 30     SLS on Airex Rt LE only 3x30 seconds with intermittent UE support as needed  Vasopneumatic device 10 mins medium 34 degrees Rt knee  01/28/2022:  BFR supine : 196 mmHg LOP, cuff size 4 Rt leg  Therex:       Recumbent bike Lvl 1 5 mins seat 6    Supine SLR x 15     Seated SLR 3 x 10 Rt (cues for home use)   Seated isometric alternating 10 sec quad/10 sec hamstring in 60 deg knee flexion c BFR 147 mmHg 5 mins   Seated LAQ c BFR 147 mmHg x 30, 3 x 15 c 30 sec breaks    Sit to stand to side 20 inch chair BFR 147 mmHg 2 x 15  Neuro re-ed: tandem stance 1 min x 1 bilateral on airex foam, reverse ambulation c focus on loading Rt leg control (in // bars 10 ft x 6)  Vasopneumatic device 10 mins medium 34 degrees Rt knee         PATIENT EDUCATION:  01/16/2022:  Education details: HEP, POC, precautions Person educated: Patient Education method: Consulting civil engineer, Demonstration, Verbal cues, and Handouts Education comprehension: verbalized understanding and returned demonstration     HOME EXERCISE PROGRAM: Access Code: [email or web address]      ASSESSMENT:   CLINICAL IMPRESSION: Antalgic gait changes still noted in Rt leg c mild deviation from normal cycle.  Continued strengthening and balance control improvements to improve neuromuscular control to help improve.    REHAB POTENTIAL: Good   CLINICAL DECISION MAKING: Stable/uncomplicated   EVALUATION COMPLEXITY: Low     GOALS: Goals reviewed with patient? Yes   SHORT TERM GOALS:  assessed 01/20/2022 STG Name Target Date Goal status  1 Patient will demonstrate independent use of home exercise program to maintain progress from in clinic treatments.   02/06/2022 Met  2 Patient will demonstrate safe independent ambulation community  distances  02/06/2022 Met Assessed 01/30/2022                                                 LONG TERM GOALS:    LTG Name Target Date Goal status  1 Patient will demonstrate/report pain at worst less than or equal to 2/10 to facilitate minimal limitation in daily activity secondary to pain symptoms.  03/27/2022 Ongoing 02/04/2022  2 Patient will demonstrate independent use of home exercise program to facilitate ability to maintain/progress functional gains from skilled physical therapy services.  03/27/2022 Ongoing 02/04/2022  3 Patient will demonstrate FOTO outcome > or = 75% to indicated reduced disability due to  condition. 03/27/2022 Ongoing 02/04/2022  4 Patient will demonstrate Rt  knee AROM 0-120 degrees to facilitate ability to perform transfers, sitting, ambulation, stair navigation s restriction due to mobility. 03/27/2022 Ongoing 02/04/2022  5 Patient will demonstrate Rt LE MMT 5/5, within 15% dynamometry readings throughout to facilitate ability to perform usual standing, walking, stairs at PLOF s limitation due to symptoms. 03/27/2022 Ongoing 02/04/2022  6 Patient will demonstrate bilateral SLS > 30 seconds s deviation.  03/27/2022 Ongoing 02/04/2022  7 Pt will demonstrate ascending/descending stairs reciprocal gait pattern s UE assist for household navigation.    03/27/2022 Ongoing 02/04/2022    PLAN: PT FREQUENCY: 2x/week   PT DURATION: 10 weeks   PLANNED INTERVENTIONS: Therapeutic exercises, Therapeutic activity, Neuro Muscular re-education, Balance training, Gait training, Patient/Family education, Joint mobilization, Stair training, DME instructions, Dry Needling, Electrical stimulation, Cryotherapy, Moist heat, Taping, Ultrasound, Ionotophoresis 40m/ml Dexamethasone, and Manual therapy.  All included unless contraindicated   PLAN FOR NEXT SESSION: BFR use continued, balance  no open chain loaded exercise.   MScot Jun PT, DPT, OCS, ATC 02/06/22  9:29 AM

## 2022-02-11 ENCOUNTER — Encounter: Payer: Self-pay | Admitting: Rehabilitative and Restorative Service Providers"

## 2022-02-11 ENCOUNTER — Ambulatory Visit (INDEPENDENT_AMBULATORY_CARE_PROVIDER_SITE_OTHER): Payer: PRIVATE HEALTH INSURANCE | Admitting: Rehabilitative and Restorative Service Providers"

## 2022-02-11 ENCOUNTER — Other Ambulatory Visit: Payer: Self-pay

## 2022-02-11 DIAGNOSIS — M6281 Muscle weakness (generalized): Secondary | ICD-10-CM

## 2022-02-11 DIAGNOSIS — R262 Difficulty in walking, not elsewhere classified: Secondary | ICD-10-CM

## 2022-02-11 DIAGNOSIS — M25561 Pain in right knee: Secondary | ICD-10-CM

## 2022-02-11 DIAGNOSIS — R600 Localized edema: Secondary | ICD-10-CM

## 2022-02-11 DIAGNOSIS — M25661 Stiffness of right knee, not elsewhere classified: Secondary | ICD-10-CM

## 2022-02-11 NOTE — Therapy (Signed)
OUTPATIENT PHYSICAL THERAPY TREATMENT NOTE   Patient Name: Leroy Griffith MRN: [id number] DOB:August 15, 1986, 36 y.o., male Today's Date: 02/11/2022  PCP: Merryl Hacker, No REFERRING PROVIDER: Meredith Pel, MD   PT End of Session - 02/11/22 0940     Visit Number 8    Number of Visits 20    Date for PT Re-Evaluation 03/27/22    Authorization Type Workers Compensation    Authorization - Visit Number 7    Authorization - Number of Visits 16    Progress Note Due on Visit 10    PT Start Time [phone]    PT Stop Time 1015    PT Time Calculation (min) 51 min    Activity Tolerance Patient tolerated treatment well    Behavior During Therapy WFL for tasks assessed/performed                   Past Medical History:  Diagnosis Date   GERD (gastroesophageal reflux disease)    Sleep apnea    Past Surgical History:  Procedure Laterality Date   ANTERIOR CRUCIATE LIGAMENT REPAIR Right 01/07/2022   Procedure: RIGHT KNEE ANTERIOR CRUCIATE LIGAMENT RECONSTRUCTION QUAD AUTOGRAFT, PARTIAL MEDIAL MENISCECTOMY;  Surgeon: Meredith Pel, MD;  Location: [place];  Service: Orthopedics;  Laterality: Right;   Patient Active Problem List   Diagnosis Date Noted   Rupture of anterior cruciate ligament of right knee    Acute medial meniscus tear of right knee     REFERRING PROVIDER: Meredith Pel, MD   REFERRING DIAG: [phone] (ICD-10-CM) - Rupture of anterior cruciate ligament of right knee, initial encounter  THERAPY DIAG:  Acute pain of right knee  Muscle weakness (generalized)  Difficulty in walking, not elsewhere classified  Stiffness of right knee, not elsewhere classified  Localized edema  PERTINENT HISTORY: unremarkable  PRECAUTIONS: Knee : no loaded open chain exercise, leg press/squat strengthening 0-60 degrees for first 4-6 weeks   WEIGHT BEARING RESTRICTIONS None at this time.   SUBJECTIVE:  Pt indicated having some sleeping difficulty due to complications with kid not  sleeping.  Has been sleeping on air mattress.    PAIN:  Are you having pain? No pain at rest NPRS scale:0/10 Pain location: Rt knee  Pain orientation: Right  PAIN TYPE: surgery  Pain description:  Aggravating factors:  Relieving factors:      OBJECTIVE:        PATIENT SURVEYS:  FOTO 01/16/2022 intake:  43 predicted:  75   COGNITION:          01/16/2022: Overall cognitive status: Within functional limits for tasks assessed                            SENSATION:          01/16/2022: Light touch: Appears intact   Edema/Atrophy:          01/16/2022: jt measurement Lt: 14.5 inch   Rt: 16.5 inch                          Quad 6 inches proximal to superior patellar border Lt: 21 inch    Rt: 20.5 inch     PALPATION: 01/16/2022: Mild tenderness around incisions, distal quad insertion   LE AROM/PROM:   A/PROM Right 01/16/2022 Left 01/16/2022 Right 01/28/2022 Right 02/04/2022  Hip flexion        Hip extension  Hip abduction        Hip adduction        Hip internal rotation        Hip external rotation        Knee flexion AROM in supine heel slide: 90   PROM in supine heel slide: 93 AROM in supine heel slide: 146 AROM in supine heel slide: 114 degrees AROM in supine Heel slide: 115 degrees  Knee extension AROM in LAQ: -50 c pain noted PROM in supine heel prop -2   AROM in LAQ:  2 degrees hyperextension   AROM in sitting LAQ: -5 degrees AROM in supine -5 degrees  Ankle dorsiflexion        Ankle plantarflexion        Ankle inversion        Ankle eversion         (Blank rows = not tested)   LE MMT:   MMT   Right 01/16/2022 Left 01/16/2022 Right 01/28/2022  Hip flexion 4/5 5/5   Hip extension       Hip abduction       Hip adduction       Hip internal rotation       Hip external rotation       Knee flexion 2/5 5/5   Knee extension 1+ 5/5 3/5 (no resistance testing today)  Ankle dorsiflexion 5/5 5/5   Ankle plantarflexion       Ankle inversion 5/5 5/5   Ankle eversion  5/5 5/5    (Blank rows = not tested)       FUNCTIONAL TESTS:  02/11/2022:  Rt SLS: 25 seconds  01/28/2022: able to perform SLR x 15 c good control( Requirement noted from MD note for ambulation without immobilizer  01/16/2022:  Rt SLS: unable   GAIT: 01/28/2022: Ambulation into clinic without immobilizer, decreased stance on Lt, lacking full TKE in terminal swing  01/20/2022: Immobilizer Rt knee c no crutches noted  01/16/2022 Assistive device utilized: axillary crutch Lt side, immobilizer Rt knee Level of assistance: Modified independence Comments: Decreased stance on Rt leg, immobilizer limits as expected.        Today's Treatment:   02/11/2022:   BFR   : 196 mmHg, exercise 150 mmHg LOP, cuff size 4 Rt leg  Therex:       Recumbent bike BFR 150 mmHg 10 mins lvl 3   Leg press to 70 degrees knee flexion - Rt LE only 50 lbs (BFR 149mHg: x 30, 3x15) (increase weight next visit)   Lateral step up/down 4 inch 3 x 15  c TKE blue band for Rt knee     Neuro re-ed:  Lateral stepping SL loading 3 cones x 10 bilateral   SLS c vector slider fwd/latera/reverse x 10 each bilateral     Vasopneumatic device 10 mins medium 34 degrees Rt knee  02/06/2022:   BFR   : 196 mmHg, exercise 150 mmHg LOP, cuff size 4 Rt leg  Therex:       UBE LE only BFR 140 mmHg 5 mins 30 sec break 5 mins (10 mins total around 70-80 rpm lvl 4.5 to 5.5 adjustment to meet 6-7 RPE out of 10.    Leg press to 70 degrees knee flexion - Rt LE only 43 lbs (BFR 1590mg: x 30, 3x15)      Neuro re-ed:  fitter wobble board fwd/back 30 x   SLS c cone touches (anterior, anterior/lateral, anterior/medial) x 10 bilateral on foam  SLS c vector slider fwd/latera/reverse x 10 each bilateral   Tandem ambulation fwd/back on foam 8 ft x 6 each way  Vasopneumatic device 10 mins medium 34 degrees Rt knee  02/04/2022:   BFR   : 196 mmHg, exercise 150 mmHg LOP, cuff size 4 Rt leg  Therex:       Recumbent bike only lvl 4 x  8 mins  seat at 6    Leg press to 70 degrees knee flexion - Rt LE only 37 lbs (BFR 137mHg: x 30, 3x15)    Seated SLR 30, 3 x 15 Rt c (BFR 150 mmHg)   Step up on Rt leg 6 inch step ( BFR 1560mg x30, 3x15)    Neuro re-ed:  fitter wobble board fwd/back 30 x   Retro step Rt leg posterior 20x   Tandem ambulation fwd/back on foam 8 ft x 6 each way  Vasopneumatic device 10 mins medium 34 degrees Rt knee     01/30/2022:  BFR   : 196 mmHg LOP, cuff size 4 Rt leg  Therex:       UBE LE only lvl 5 6 mins    Leg press to 70 degrees knee flexion - double leg x 15, single leg 2 x 10 bilateral   37 lbs(add BFR next time c adjustment in weight)   Seated SLR 3 x 10 Rt c BFR 150 mmHg    Step up on Rt leg 4 inch x 15    Neuro re-ed:  fitter wobble board fwd/back x 30     SLS on Airex Rt LE only 3x30 seconds with intermittent UE support as needed  Vasopneumatic device 10 mins medium 34 degrees Rt knee     PATIENT EDUCATION:  01/16/2022:  Education details: HEP, POC, precautions Person educated: Patient Education method: ExConsulting civil engineerDemonstration, Verbal cues, and Handouts Education comprehension: verbalized understanding and returned demonstration     HOME EXERCISE PROGRAM: Access Code: [email or web address]      ASSESSMENT:   CLINICAL IMPRESSION: Pt continued to show progression in strengthening program without pain complaints noted.  WB eccentric control lacking.     REHAB POTENTIAL: Good   CLINICAL DECISION MAKING: Stable/uncomplicated   EVALUATION COMPLEXITY: Low     GOALS: Goals reviewed with patient? Yes   SHORT TERM GOALS:  assessed 01/20/2022 STG Name Target Date Goal status  1 Patient will demonstrate independent use of home exercise program to maintain progress from in clinic treatments.   02/06/2022 Met  2 Patient will demonstrate safe independent ambulation community distances  02/06/2022 Met Assessed 01/30/2022                                                 LONG TERM GOALS:     LTG Name Target Date Goal status  1 Patient will demonstrate/report pain at worst less than or equal to 2/10 to facilitate minimal limitation in daily activity secondary to pain symptoms.  03/27/2022 Ongoing 02/04/2022  2 Patient will demonstrate independent use of home exercise program to facilitate ability to maintain/progress functional gains from skilled physical therapy services.  03/27/2022 Ongoing 02/04/2022  3 Patient will demonstrate FOTO outcome > or = 75% to indicated reduced disability due to condition. 03/27/2022 Ongoing 02/04/2022  4 Patient will demonstrate Rt  knee AROM 0-120 degrees to facilitate ability to perform transfers, sitting, ambulation, stair  navigation s restriction due to mobility. 03/27/2022 Ongoing 02/04/2022  5 Patient will demonstrate Rt LE MMT 5/5, within 15% dynamometry readings throughout to facilitate ability to perform usual standing, walking, stairs at PLOF s limitation due to symptoms. 03/27/2022 Ongoing 02/04/2022  6 Patient will demonstrate bilateral SLS > 30 seconds s deviation.  03/27/2022 Ongoing 02/04/2022  7 Pt will demonstrate ascending/descending stairs reciprocal gait pattern s UE assist for household navigation.    03/27/2022 Ongoing 02/04/2022    PLAN: PT FREQUENCY: 2x/week   PT DURATION: 10 weeks   PLANNED INTERVENTIONS: Therapeutic exercises, Therapeutic activity, Neuro Muscular re-education, Balance training, Gait training, Patient/Family education, Joint mobilization, Stair training, DME instructions, Dry Needling, Electrical stimulation, Cryotherapy, Moist heat, Taping, Ultrasound, Ionotophoresis 43m/ml Dexamethasone, and Manual therapy.  All included unless contraindicated   PLAN FOR NEXT SESSION: BFR use continued, balance, eccentric loading control on 4 in step laterally repeated.   no open chain loaded exercise.   MScot Jun PT, DPT, OCS, ATC 02/11/22  10:02 AM

## 2022-02-13 ENCOUNTER — Ambulatory Visit (INDEPENDENT_AMBULATORY_CARE_PROVIDER_SITE_OTHER): Payer: PRIVATE HEALTH INSURANCE | Admitting: Rehabilitative and Restorative Service Providers"

## 2022-02-13 ENCOUNTER — Other Ambulatory Visit: Payer: Self-pay

## 2022-02-13 ENCOUNTER — Encounter: Payer: Self-pay | Admitting: Rehabilitative and Restorative Service Providers"

## 2022-02-13 ENCOUNTER — Encounter: Payer: No Typology Code available for payment source | Admitting: Rehabilitative and Restorative Service Providers"

## 2022-02-13 DIAGNOSIS — M25561 Pain in right knee: Secondary | ICD-10-CM | POA: Diagnosis not present

## 2022-02-13 DIAGNOSIS — M25661 Stiffness of right knee, not elsewhere classified: Secondary | ICD-10-CM

## 2022-02-13 DIAGNOSIS — R600 Localized edema: Secondary | ICD-10-CM

## 2022-02-13 DIAGNOSIS — M6281 Muscle weakness (generalized): Secondary | ICD-10-CM

## 2022-02-13 DIAGNOSIS — R262 Difficulty in walking, not elsewhere classified: Secondary | ICD-10-CM

## 2022-02-13 NOTE — Therapy (Signed)
OUTPATIENT PHYSICAL THERAPY TREATMENT NOTE   Patient Name: Leroy Griffith MRN: [id number] DOB:Sep 24, 1986, 36 y.o., male Today's Date: 02/13/2022  PCP: Pcp, No REFERRING PROVIDER: No ref. provider found   PT End of Session - 02/13/22 1155     Visit Number 9    Number of Visits 20    Date for PT Re-Evaluation 03/27/22    Authorization Type [employer]    Authorization - Visit Number 9    Authorization - Number of Visits 16    Progress Note Due on Visit 10    PT Start Time 1152    PT Stop Time 8    PT Time Calculation (min) 38 min    Activity Tolerance Patient tolerated treatment well    Behavior During Therapy WFL for tasks assessed/performed                    Past Medical History:  Diagnosis Date   GERD (gastroesophageal reflux disease)    Sleep apnea    Past Surgical History:  Procedure Laterality Date   ANTERIOR CRUCIATE LIGAMENT REPAIR Right 01/07/2022   Procedure: RIGHT KNEE ANTERIOR CRUCIATE LIGAMENT RECONSTRUCTION QUAD AUTOGRAFT, PARTIAL MEDIAL MENISCECTOMY;  Surgeon: Meredith Pel, MD;  Location: [facility];  Service: Orthopedics;  Laterality: Right;   Patient Active Problem List   Diagnosis Date Noted   Rupture of anterior cruciate ligament of right knee    Acute medial meniscus tear of right knee     REFERRING PROVIDER: Meredith Pel, MD   REFERRING DIAG: [phone] (ICD-10-CM) - Rupture of anterior cruciate ligament of right knee, initial encounter  THERAPY DIAG:  Muscle weakness (generalized)  Acute pain of right knee  Difficulty in walking, not elsewhere classified  Stiffness of right knee, not elsewhere classified  Localized edema  PERTINENT HISTORY: unremarkable  PRECAUTIONS: Knee : no loaded open chain exercise, leg press/squat strengthening 0-60 degrees for first 4-6 weeks   WEIGHT BEARING RESTRICTIONS None at this time.   SUBJECTIVE:  Pt indicated having some sleeping difficulty due to complications with kid not  sleeping.  Has been sleeping on air mattress.    PAIN:  Are you having pain? No pain at rest NPRS scale:0/10 Pain location: Rt knee  Pain orientation: Right  PAIN TYPE: surgery  Pain description:  Aggravating factors:  Relieving factors:      OBJECTIVE:        PATIENT SURVEYS:  FOTO 01/16/2022 intake:  43 predicted:  75   COGNITION:          01/16/2022: Overall cognitive status: Within functional limits for tasks assessed                            SENSATION:          01/16/2022: Light touch: Appears intact   Edema/Atrophy:          01/16/2022: jt measurement Lt: 14.5 inch   Rt: 16.5 inch                          Quad 6 inches proximal to superior patellar border Lt: 21 inch    Rt: 20.5 inch     PALPATION: 01/16/2022: Mild tenderness around incisions, distal quad insertion   LE AROM/PROM:   A/PROM Right 01/16/2022 Left 01/16/2022 Right 01/28/2022 Right 02/04/2022  Hip flexion        Hip extension  Hip abduction        Hip adduction        Hip internal rotation        Hip external rotation        Knee flexion AROM in supine heel slide: 90   PROM in supine heel slide: 93 AROM in supine heel slide: 146 AROM in supine heel slide: 114 degrees AROM in supine Heel slide: 115 degrees  Knee extension AROM in LAQ: -50 c pain noted PROM in supine heel prop -2   AROM in LAQ:  2 degrees hyperextension   AROM in sitting LAQ: -5 degrees AROM in supine -5 degrees  Ankle dorsiflexion        Ankle plantarflexion        Ankle inversion        Ankle eversion         (Blank rows = not tested)   LE MMT:   MMT   Right 01/16/2022 Left 01/16/2022 Right 01/28/2022  Hip flexion 4/5 5/5   Hip extension       Hip abduction       Hip adduction       Hip internal rotation       Hip external rotation       Knee flexion 2/5 5/5   Knee extension 1+ 5/5 3/5 (no resistance testing today)  Ankle dorsiflexion 5/5 5/5   Ankle plantarflexion       Ankle inversion 5/5 5/5   Ankle eversion  5/5 5/5    (Blank rows = not tested)       FUNCTIONAL TESTS:  02/11/2022:  Rt SLS: 25 seconds  01/28/2022: able to perform SLR x 15 c good control( Requirement noted from MD note for ambulation without immobilizer  01/16/2022:  Rt SLS: unable   GAIT: 01/28/2022: Ambulation into clinic without immobilizer, decreased stance on Lt, lacking full TKE in terminal swing  01/20/2022: Immobilizer Rt knee c no crutches noted  01/16/2022 Assistive device utilized: axillary crutch Lt side, immobilizer Rt knee Level of assistance: Modified independence Comments: Decreased stance on Rt leg, immobilizer limits as expected.        Today's Treatment:  02/13/2022:   BFR   : 196 mmHg, exercise 150 mmHg LOP, cuff size 4 Rt leg  Therex:       UBE LE Lvl 5.5 5 mins   Leg press to 70 degrees knee flexion - Rt LE only 62 lbs (BFR 159mHg: x 30, 3x15) (increase weight next visit)   Lateral step up/down 4 inch 3 x 15  c TKE blue band for Rt knee   Spanish squat c strap support 0-70 degrees BFR 145 mmHg 30x, 3 x 15     Neuro re-ed:  Lateral stepping SL loading 3 cones x 10 bilateral   Fitter board double leg fwd/back 20x, single leg Rt 20x c finger tip assist        02/11/2022:   BFR   : 196 mmHg, exercise 150 mmHg LOP, cuff size 4 Rt leg  Therex:       Recumbent bike BFR 150 mmHg 10 mins lvl 3   Leg press to 70 degrees knee flexion - Rt LE only 50 lbs (BFR 159mg: x 30, 3x15) (increase weight next visit)   Lateral step up/down 4 inch 3 x 15  c TKE blue band for Rt knee     Neuro re-ed:  Lateral stepping SL loading 3 cones x 10 bilateral   SLS c  vector slider fwd/latera/reverse x 10 each bilateral     Vasopneumatic device 10 mins medium 34 degrees Rt knee  02/06/2022:   BFR   : 196 mmHg, exercise 150 mmHg LOP, cuff size 4 Rt leg  Therex:       UBE LE only BFR 140 mmHg 5 mins 30 sec break 5 mins (10 mins total around 70-80 rpm lvl 4.5 to 5.5 adjustment to meet 6-7 RPE out of 10.    Leg press to  70 degrees knee flexion - Rt LE only 43 lbs (BFR 128mHg: x 30, 3x15)      Neuro re-ed:  fitter wobble board fwd/back 30 x   SLS c cone touches (anterior, anterior/lateral, anterior/medial) x 10 bilateral on foam   SLS c vector slider fwd/latera/reverse x 10 each bilateral   Tandem ambulation fwd/back on foam 8 ft x 6 each way  Vasopneumatic device 10 mins medium 34 degrees Rt knee  02/04/2022:   BFR   : 196 mmHg, exercise 150 mmHg LOP, cuff size 4 Rt leg  Therex:       Recumbent bike only lvl 4 x  8 mins seat at 6    Leg press to 70 degrees knee flexion - Rt LE only 37 lbs (BFR 1568mg: x 30, 3x15)    Seated SLR 30, 3 x 15 Rt c (BFR 150 mmHg)   Step up on Rt leg 6 inch step ( BFR 15015m x30, 3x15)    Neuro re-ed:  fitter wobble board fwd/back 30 x   Retro step Rt leg posterior 20x   Tandem ambulation fwd/back on foam 8 ft x 6 each way  Vasopneumatic device 10 mins medium 34 degrees Rt knee    PATIENT EDUCATION:  01/16/2022:  Education details: HEP, POC, precautions Person educated: Patient Education method: ExpConsulting civil engineeremonstration, Verbal cues, and Handouts Education comprehension: verbalized understanding and returned demonstration     HOME EXERCISE PROGRAM: Access Code: [email or web address]      ASSESSMENT:   CLINICAL IMPRESSION: Continued progression in strengthening and balance control per typical protocol (no OKC loaded).  Fair control on single leg control on Rt c fatigue noted at times during strengthening.    REHAB POTENTIAL: Good   CLINICAL DECISION MAKING: Stable/uncomplicated   EVALUATION COMPLEXITY: Low     GOALS: Goals reviewed with patient? Yes   SHORT TERM GOALS:  assessed 01/20/2022 STG Name Target Date Goal status  1 Patient will demonstrate independent use of home exercise program to maintain progress from in clinic treatments.   02/06/2022 Met  2 Patient will demonstrate safe independent ambulation community distances  02/06/2022 Met Assessed  01/30/2022                                                 LONG TERM GOALS:    LTG Name Target Date Goal status  1 Patient will demonstrate/report pain at worst less than or equal to 2/10 to facilitate minimal limitation in daily activity secondary to pain symptoms.  03/27/2022 Ongoing 02/04/2022  2 Patient will demonstrate independent use of home exercise program to facilitate ability to maintain/progress functional gains from skilled physical therapy services.  03/27/2022 Ongoing 02/04/2022  3 Patient will demonstrate FOTO outcome > or = 75% to indicated reduced disability due to condition. 03/27/2022 Ongoing 02/04/2022  4 Patient will demonstrate Rt  knee AROM 0-120  degrees to facilitate ability to perform transfers, sitting, ambulation, stair navigation s restriction due to mobility. 03/27/2022 Ongoing 02/04/2022  5 Patient will demonstrate Rt LE MMT 5/5, within 15% dynamometry readings throughout to facilitate ability to perform usual standing, walking, stairs at PLOF s limitation due to symptoms. 03/27/2022 Ongoing 02/04/2022  6 Patient will demonstrate bilateral SLS > 30 seconds s deviation.  03/27/2022 Ongoing 02/04/2022  7 Pt will demonstrate ascending/descending stairs reciprocal gait pattern s UE assist for household navigation.    03/27/2022 Ongoing 02/04/2022    PLAN: PT FREQUENCY: 2x/week   PT DURATION: 10 weeks   PLANNED INTERVENTIONS: Therapeutic exercises, Therapeutic activity, Neuro Muscular re-education, Balance training, Gait training, Patient/Family education, Joint mobilization, Stair training, DME instructions, Dry Needling, Electrical stimulation, Cryotherapy, Moist heat, Taping, Ultrasound, Ionotophoresis 59m/ml Dexamethasone, and Manual therapy.  All included unless contraindicated   PLAN FOR NEXT SESSION: BFR use continued, balance, strengthening.  No OKC loaded   MScot Jun PT, DPT, OCS, ATC 02/13/22  12:21 PM

## 2022-02-18 ENCOUNTER — Ambulatory Visit (INDEPENDENT_AMBULATORY_CARE_PROVIDER_SITE_OTHER): Payer: No Typology Code available for payment source | Admitting: Rehabilitative and Restorative Service Providers"

## 2022-02-18 ENCOUNTER — Other Ambulatory Visit: Payer: Self-pay

## 2022-02-18 ENCOUNTER — Encounter: Payer: Self-pay | Admitting: Rehabilitative and Restorative Service Providers"

## 2022-02-18 DIAGNOSIS — M6281 Muscle weakness (generalized): Secondary | ICD-10-CM

## 2022-02-18 DIAGNOSIS — M25661 Stiffness of right knee, not elsewhere classified: Secondary | ICD-10-CM

## 2022-02-18 DIAGNOSIS — M25561 Pain in right knee: Secondary | ICD-10-CM | POA: Diagnosis not present

## 2022-02-18 DIAGNOSIS — R262 Difficulty in walking, not elsewhere classified: Secondary | ICD-10-CM

## 2022-02-18 DIAGNOSIS — R600 Localized edema: Secondary | ICD-10-CM

## 2022-02-18 NOTE — Therapy (Signed)
OUTPATIENT PHYSICAL THERAPY TREATMENT NOTE    Patient Name: Leroy Griffith MRN: [id number] DOB:[birth year], 36 y.o., male Today's Date: 02/18/2022  PCP: Merryl Hacker, No REFERRING PROVIDER: Meredith Pel, MD   Past Medical History:  Diagnosis Date   GERD (gastroesophageal reflux disease)    Sleep apnea    Past Surgical History:  Procedure Laterality Date   ANTERIOR CRUCIATE LIGAMENT REPAIR Right 01/07/2022   Procedure: RIGHT KNEE ANTERIOR CRUCIATE LIGAMENT RECONSTRUCTION QUAD AUTOGRAFT, PARTIAL MEDIAL MENISCECTOMY;  Surgeon: Meredith Pel, MD;  Location: [place];  Service: Orthopedics;  Laterality: Right;   Patient Active Problem List   Diagnosis Date Noted   Rupture of anterior cruciate ligament of right knee    Acute medial meniscus tear of right knee     REFERRING PROVIDER: Meredith Pel, MD   REFERRING DIAG: [phone] (ICD-10-CM) - Rupture of anterior cruciate ligament of right knee, initial encounter  THERAPY DIAG:  Acute pain of right knee  Muscle weakness (generalized)  Difficulty in walking, not elsewhere classified  Stiffness of right knee, not elsewhere classified  Localized edema  PERTINENT HISTORY: unremarkable  PRECAUTIONS: Knee : no loaded open chain exercise, leg press/squat strengthening 0-60 degrees for first 4-6 weeks   WEIGHT BEARING RESTRICTIONS None at this time.   SUBJECTIVE:  Pt indicated doing some chores that led to some swelling but nothing really painful.    PAIN:  Are you having pain? No pain at rest NPRS scale:0/10 Pain location: Rt knee  Pain orientation: Right  PAIN TYPE: surgery  Pain description:  Aggravating factors:  Relieving factors:      OBJECTIVE:        PATIENT SURVEYS:  FOTO 01/16/2022 intake:  43 predicted:  75   COGNITION:          01/16/2022: Overall cognitive status: Within functional limits for tasks assessed                            SENSATION:          01/16/2022: Light touch: Appears intact    Edema/Atrophy:          01/16/2022: jt measurement Lt: 14.5 inch   Rt: 16.5 inch                          Quad 6 inches proximal to superior patellar border Lt: 21 inch    Rt: 20.5 inch     PALPATION: 01/16/2022: Mild tenderness around incisions, distal quad insertion   LE AROM/PROM:   A/PROM Right 01/16/2022 Left 01/16/2022 Right 01/28/2022 Right 02/04/2022 Right 02/18/2022  Hip flexion         Hip extension         Hip abduction         Hip adduction         Hip internal rotation         Hip external rotation         Knee flexion AROM in supine heel slide: 90   PROM in supine heel slide: 93 AROM in supine heel slide: 146 AROM in supine heel slide: 114 degrees AROM in supine Heel slide: 115 degrees AROM in supine heel slide 130 degrees  Knee extension AROM in LAQ: -50 c pain noted PROM in supine heel prop -2   AROM in LAQ:  2 degrees hyperextension   AROM in sitting LAQ: -5 degrees AROM in supine -5  degrees   Ankle dorsiflexion         Ankle plantarflexion         Ankle inversion         Ankle eversion          (Blank rows = not tested)   LE MMT:   MMT   Right 01/16/2022 Left 01/16/2022 Right 01/28/2022  Hip flexion 4/5 5/5   Hip extension       Hip abduction       Hip adduction       Hip internal rotation       Hip external rotation       Knee flexion 2/5 5/5   Knee extension 1+ 5/5 3/5 (no resistance testing today)  Ankle dorsiflexion 5/5 5/5   Ankle plantarflexion       Ankle inversion 5/5 5/5   Ankle eversion 5/5 5/5    (Blank rows = not tested)       FUNCTIONAL TESTS:  02/11/2022:  Rt SLS: 25 seconds  01/28/2022: able to perform SLR x 15 c good control( Requirement noted from MD note for ambulation without immobilizer  01/16/2022:  Rt SLS: unable   GAIT: 01/28/2022: Ambulation into clinic without immobilizer, decreased stance on Lt, lacking full TKE in terminal swing  01/20/2022: Immobilizer Rt knee c no crutches noted  01/16/2022 Assistive device utilized:  axillary crutch Lt side, immobilizer Rt knee Level of assistance: Modified independence Comments: Decreased stance on Rt leg, immobilizer limits as expected.        Today's Treatment:  02/18/2022:   BFR   : 196 mmHg, exercise 150 mmHg LOP, cuff size 4 Rt leg  Therex:       Recumbent bike 2 min warm up lvl 4, 6 mins of 30 sec on /off interval at 100 rpm fast/75 rpm off time), 2 mins cool down lvl 2 (10 mins total0   Leg press to 70 degrees knee flexion - Rt LE only 68 lbs (BFR 146mHg: x 30, 3x15) (increase weight next visit)   Fwd step up/down 6 inch 3 x 15  Rt leg c BFR 150 mmHg        Neuro re-ed:  Reactive blazepod Lateral stepping SL loading within 2 second limit 30 second bout x 4 bilateral   Reactive blazepod taps in SLS on airex foam 4 pods 30 sec x 4 bilateral  Vasopneumatic: 10 mins medium 34 degrees Rt knee     02/13/2022:   BFR   : 196 mmHg, exercise 150 mmHg LOP, cuff size 4 Rt leg  Therex:       UBE LE Lvl 5.5 5 mins   Leg press to 70 degrees knee flexion - Rt LE only 62 lbs (BFR 1578mg: x 30, 3x15) (increase weight next visit)   Lateral step up/down 4 inch 3 x 15  c TKE blue band for Rt knee   Spanish squat c strap support 0-70 degrees BFR 145 mmHg 30x, 3 x 15     Neuro re-ed:  Lateral stepping SL loading 3 cones x 10 bilateral   Fitter board double leg fwd/back 20x, single leg Rt 20x c finger tip assist     02/11/2022:   BFR   : 196 mmHg, exercise 150 mmHg LOP, cuff size 4 Rt leg  Therex:       Recumbent bike BFR 150 mmHg 10 mins lvl 3   Leg press to 70 degrees knee flexion - Rt LE only 50 lbs (BFR  3x15) (increase weight next visit) °  Lateral step up/down 4 inch 3 x 15  c TKE blue band for Rt knee °   ° °Neuro re-ed:  Lateral stepping SL loading 3 cones x 10 bilateral °  SLS c vector slider fwd/latera/reverse x 10 each bilateral °   ° °Vasopneumatic device 10 mins medium 34 degrees Rt knee ° °02/06/2022:  ° °BFR   : 196 mmHg, exercise 150 mmHg LOP,  cuff size 4 Rt leg ° °Therex:       UBE LE only BFR 140 mmHg 5 mins 30 sec break 5 mins (10 mins total around 70-80 rpm lvl 4.5 to 5.5 adjustment to meet 6-7 RPE out of 10.  °  Leg press to 70 degrees knee flexion - Rt LE only 43 lbs (BFR 150mmHg: x 30, 3x15)  °   ° °Neuro re-ed:  fitter wobble board fwd/back 30 x °  SLS c cone touches (anterior, anterior/lateral, anterior/medial) x 10 bilateral on foam °  SLS c vector slider fwd/latera/reverse x 10 each bilateral °  Tandem ambulation fwd/back on foam 8 ft x 6 each way ° °Vasopneumatic device 10 mins medium 34 degrees Rt knee ° °  °PATIENT EDUCATION:  °01/16/2022:  °Education details: HEP, POC, precautions °Person educated: Patient °Education method: Explanation, Demonstration, Verbal cues, and Handouts °Education comprehension: verbalized understanding and returned demonstration °  °  °HOME EXERCISE PROGRAM: °Access Code: [email or web address] ° °  °  °ASSESSMENT: °  °CLINICAL IMPRESSION: °Improved control in compliant surface balance as noted today.  Pt to continue from progressive WB and machine based strengthening program with inclusion of progressive balance control transition.  °  °REHAB POTENTIAL: Good °  °CLINICAL DECISION MAKING: Stable/uncomplicated °  °EVALUATION COMPLEXITY: Low °  °  °GOALS: °Goals reviewed with patient? Yes °  °SHORT TERM GOALS: ° assessed 01/20/2022 °STG Name Target Date Goal status  °1 Patient will demonstrate independent use of home exercise program to maintain progress from in clinic treatments.   02/06/2022 Met  °2 Patient will demonstrate safe independent ambulation community distances  02/06/2022 Met °Assessed 01/30/2022  °         °         °         °         °         °  °LONG TERM GOALS:  °  °LTG Name Target Date Goal status  °1 Patient will demonstrate/report pain at worst less than or equal to 2/10 to facilitate minimal limitation in daily activity secondary to pain symptoms.  03/27/2022 Ongoing °02/04/2022  °2 Patient will demonstrate  independent use of home exercise program to facilitate ability to maintain/progress functional gains from skilled physical therapy services.  03/27/2022 Ongoing °02/04/2022  °3 Patient will demonstrate FOTO outcome > or = 75% to indicated reduced disability due to condition. 03/27/2022 Ongoing °02/04/2022  °4 Patient will demonstrate Rt  knee AROM 0-120 degrees to facilitate ability to perform transfers, sitting, ambulation, stair navigation s restriction due to mobility. 03/27/2022 Ongoing °02/04/2022  °5 Patient will demonstrate Rt LE MMT 5/5, within 15% dynamometry readings throughout to facilitate ability to perform usual standing, walking, stairs at PLOF s limitation due to symptoms. 03/27/2022 Ongoing °02/04/2022  °6 Patient will demonstrate bilateral SLS > 30 seconds s deviation.  03/27/2022 Ongoing °02/04/2022  °7 Pt will demonstrate ascending/descending stairs reciprocal gait pattern s UE assist for household navigation.  °  03/27/2022 Ongoing °02/04/2022  °  °  03/27/2022 Ongoing 02/04/2022    PLAN: PT FREQUENCY: 2x/week   PT DURATION: 10 weeks   PLANNED INTERVENTIONS: Therapeutic exercises, Therapeutic activity, Neuro Muscular re-education, Balance training, Gait training, Patient/Family education, Joint mobilization, Stair training, DME instructions, Dry Needling, Electrical stimulation, Cryotherapy, Moist heat, Taping, Ultrasound, Ionotophoresis 57m/ml Dexamethasone, and Manual therapy.  All included unless contraindicated   PLAN FOR NEXT SESSION: BFR use continued, balance, strengthening progression.   No OKC loaded   MScot Jun PT, DPT, OCS, ATC 02/18/22  10:18 AM

## 2022-02-21 ENCOUNTER — Other Ambulatory Visit: Payer: Self-pay

## 2022-02-21 ENCOUNTER — Ambulatory Visit (INDEPENDENT_AMBULATORY_CARE_PROVIDER_SITE_OTHER): Payer: PRIVATE HEALTH INSURANCE | Admitting: Rehabilitative and Restorative Service Providers"

## 2022-02-21 ENCOUNTER — Encounter: Payer: Self-pay | Admitting: Rehabilitative and Restorative Service Providers"

## 2022-02-21 DIAGNOSIS — M25661 Stiffness of right knee, not elsewhere classified: Secondary | ICD-10-CM

## 2022-02-21 DIAGNOSIS — M6281 Muscle weakness (generalized): Secondary | ICD-10-CM

## 2022-02-21 DIAGNOSIS — R262 Difficulty in walking, not elsewhere classified: Secondary | ICD-10-CM | POA: Diagnosis not present

## 2022-02-21 DIAGNOSIS — M25561 Pain in right knee: Secondary | ICD-10-CM | POA: Diagnosis not present

## 2022-02-21 DIAGNOSIS — R600 Localized edema: Secondary | ICD-10-CM

## 2022-02-21 NOTE — Therapy (Addendum)
OUTPATIENT PHYSICAL THERAPY TREATMENT NOTE    Patient Name: Leroy Griffith MRN: [id number] DOB:[birth year], 36 y.o., male Today's Date: 02/24/2022  PCP: Merryl Hacker, No REFERRING PROVIDER: Meredith Pel, MD    02/21/22 0805  PT Visits / Re-Eval  Visit Number 11  Number of Visits 20  Date for PT Re-Evaluation 03/27/22  Authorization  Authorization Type Workers Compensation  Authorization - Visit Number 11  Authorization - Number of Visits 16  Progress Note Due on Visit 15 (in prep for more visits from workers comp)  PT Time Calculation  PT Start Time 0759  PT Stop Time 0853  PT Time Calculation (min) 54 min  PT - End of Session  Activity Tolerance Patient tolerated treatment well  Behavior During Therapy WFL for tasks assessed/performed      Past Medical History:  Diagnosis Date   GERD (gastroesophageal reflux disease)    Sleep apnea    Past Surgical History:  Procedure Laterality Date   ANTERIOR CRUCIATE LIGAMENT REPAIR Right 01/07/2022   Procedure: RIGHT KNEE ANTERIOR CRUCIATE LIGAMENT RECONSTRUCTION QUAD AUTOGRAFT, PARTIAL MEDIAL MENISCECTOMY;  Surgeon: Meredith Pel, MD;  Location: [facility];  Service: Orthopedics;  Laterality: Right;   Patient Active Problem List   Diagnosis Date Noted   Rupture of anterior cruciate ligament of right knee    Acute medial meniscus tear of right knee     REFERRING PROVIDER: Meredith Pel, MD   REFERRING DIAG: [phone] (ICD-10-CM) - Rupture of anterior cruciate ligament of right knee, initial encounter  THERAPY DIAG:  Acute pain of right knee  Muscle weakness (generalized)  Difficulty in walking, not elsewhere classified  Stiffness of right knee, not elsewhere classified  Localized edema  PERTINENT HISTORY: unremarkable  PRECAUTIONS: 6 weeks plus out. No OKC loaded   WEIGHT BEARING RESTRICTIONS None at this time.   SUBJECTIVE:  No complaints of pain.    PAIN:  Are you having pain? No pain at rest NPRS  scale:0/10 Pain location: Rt knee  Pain orientation: Right  PAIN TYPE: surgery  Pain description:  Aggravating factors:  Relieving factors:      OBJECTIVE:        PATIENT SURVEYS:  FOTO 01/16/2022 intake:  43 predicted:  75   COGNITION:          01/16/2022: Overall cognitive status: Within functional limits for tasks assessed                            SENSATION:          01/16/2022: Light touch: Appears intact   Edema/Atrophy:          01/16/2022: jt measurement Lt: 14.5 inch   Rt: 16.5 inch                          Quad 6 inches proximal to superior patellar border Lt: 21 inch    Rt: 20.5 inch     PALPATION: 01/16/2022: Mild tenderness around incisions, distal quad insertion   LE AROM/PROM:   A/PROM Right 01/16/2022 Left 01/16/2022 Right 01/28/2022 Right 02/04/2022 Right 02/18/2022  Hip flexion         Hip extension         Hip abduction         Hip adduction         Hip internal rotation         Hip external rotation  Knee flexion AROM in supine heel slide: 90   PROM in supine heel slide: 93 AROM in supine heel slide: 146 AROM in supine heel slide: 114 degrees AROM in supine Heel slide: 115 degrees AROM in supine heel slide 130 degrees  Knee extension AROM in LAQ: -50 c pain noted PROM in supine heel prop -2   AROM in LAQ:  2 degrees hyperextension   AROM in sitting LAQ: -5 degrees AROM in supine -5 degrees   Ankle dorsiflexion         Ankle plantarflexion         Ankle inversion         Ankle eversion          (Blank rows = not tested)   LE MMT:   MMT   Right 01/16/2022 Left 01/16/2022 Right 01/28/2022 Right 02/21/2022 Left 02/21/2022  Hip flexion 4/5 5/5     Hip extension         Hip abduction         Hip adduction         Hip internal rotation         Hip external rotation         Knee flexion 2/5 5/5     Knee extension 1+ 5/5 3/5 (no resistance testing today) 4/5 36, 33 lbs 5/5 91.9, 93.5 lbs   Ankle dorsiflexion 5/5 5/5     Ankle plantarflexion          Ankle inversion 5/5 5/5     Ankle eversion 5/5 5/5      (Blank rows = not tested)       FUNCTIONAL TESTS:  02/11/2022:  Rt SLS: 25 seconds  01/28/2022: able to perform SLR x 15 c good control( Requirement noted from MD note for ambulation without immobilizer  01/16/2022:  Rt SLS: unable   GAIT: 01/28/2022: Ambulation into clinic without immobilizer, decreased stance on Lt, lacking full TKE in terminal swing  01/20/2022: Immobilizer Rt knee c no crutches noted  01/16/2022 Assistive device utilized: axillary crutch Lt side, immobilizer Rt knee Level of assistance: Modified independence Comments: Decreased stance on Rt leg, immobilizer limits as expected.        Today's Treatment:  02/21/2022:   BFR   : 196 mmHg, exercise 150 mmHg LOP, cuff size 4 Rt leg  Therex:       Recumbent bike 2 min warm up lvl 4, 6 mins of 30 sec on /off interval at 100 rpm fast/75 rpm off time), 2 mins cool down lvl 2 (10 mins total0   Spanish squat c strap around knees BFR 150 mmHg 30, 3 x 15   Fwd step up/down 6 inch 3 x 15  Rt leg c BFR 150 mmHg   Single leg dead lift  x 20 bilateral to 6 inch cone Leg press to 90 degrees knee flexion - Rt LE only 68 lbs (BFR 116mHg: x 30, 3x15) (increase weight next visit)   Incline calf stretch bilateral 30 sec x 3     Neuro re-ed:  Reactive blazepod Lateral stepping SL loading within 2 second limit 30 second bout x 4 bilateral   Y balance reaches SLS c light touch x 10 each way bilateral   Fitter board fwd/back double leg 20x light touching, SL x 20 bilateral     02/18/2022:   BFR   : 196 mmHg, exercise 150 mmHg LOP, cuff size 4 Rt leg  Therex:  Recumbent bike 2 min warm up lvl 4, 6 mins of 30 sec on /off interval at 100 rpm fast/75 rpm off time), 2 mins cool down lvl 2 (10 mins total0   Leg press to 70 degrees knee flexion - Rt LE only 68 lbs (BFR 132mHg: x 30, 3x15) (increase weight next visit)   Fwd step up/down 6 inch 3 x 15  Rt leg c BFR 150  mmHg        Neuro re-ed:  Reactive blazepod Lateral stepping SL loading within 2 second limit 30 second bout x 4 bilateral   Reactive blazepod taps in SLS on airex foam 4 pods 30 sec x 4 bilateral  Vasopneumatic: 10 mins medium 34 degrees Rt knee     02/13/2022:   BFR   : 196 mmHg, exercise 150 mmHg LOP, cuff size 4 Rt leg  Therex:       UBE LE Lvl 5.5 5 mins   Leg press to 70 degrees knee flexion - Rt LE only 62 lbs (BFR 154mg: x 30, 3x15) (increase weight next visit)   Lateral step up/down 4 inch 3 x 15  c TKE blue band for Rt knee   Spanish squat c strap support 0-70 degrees BFR 145 mmHg 30x, 3 x 15     Neuro re-ed:  Lateral stepping SL loading 3 cones x 10 bilateral   Fitter board double leg fwd/back 20x, single leg Rt 20x c finger tip assist     02/11/2022:   BFR   : 196 mmHg, exercise 150 mmHg LOP, cuff size 4 Rt leg  Therex:       Recumbent bike BFR 150 mmHg 10 mins lvl 3   Leg press to 70 degrees knee flexion - Rt LE only 50 lbs (BFR 15037m: x 30, 3x15) (increase weight next visit)   Lateral step up/down 4 inch 3 x 15  c TKE blue band for Rt knee     Neuro re-ed:  Lateral stepping SL loading 3 cones x 10 bilateral   SLS c vector slider fwd/latera/reverse x 10 each bilateral     Vasopneumatic device 10 mins medium 34 degrees Rt knee    PATIENT EDUCATION:  01/16/2022:  Education details: HEP, POC, precautions Person educated: Patient Education method: ExpConsulting civil engineeremonstration, Verbal cues, and Handouts Education comprehension: verbalized understanding and returned demonstration     HOME EXERCISE PROGRAM: Access Code: [email or web address]      ASSESSMENT:   CLINICAL IMPRESSION: Able to tolerate loading to 90 degrees in flexion CKC activity today.  Quality of reaches in first attempts of y balance testing was poor on Rt compared to Lt.   Plan to assess data collection for Y balance test in future visit.    REHAB POTENTIAL: Good   CLINICAL DECISION MAKING:  Stable/uncomplicated   EVALUATION COMPLEXITY: Low     GOALS: Goals reviewed with patient? Yes   SHORT TERM GOALS:  assessed 01/20/2022 STG Name Target Date Goal status  1 Patient will demonstrate independent use of home exercise program to maintain progress from in clinic treatments.   02/06/2022 Met  2 Patient will demonstrate safe independent ambulation community distances  02/06/2022 Met Assessed 01/30/2022                                                 LONG TERM GOALS:  LTG Name Target Date Goal status  1 Patient will demonstrate/report pain at worst less than or equal to 2/10 to facilitate minimal limitation in daily activity secondary to pain symptoms.  03/27/2022 Ongoing 02/04/2022  2 Patient will demonstrate independent use of home exercise program to facilitate ability to maintain/progress functional gains from skilled physical therapy services.  03/27/2022 Ongoing 02/04/2022  3 Patient will demonstrate FOTO outcome > or = 75% to indicated reduced disability due to condition. 03/27/2022 Ongoing 02/04/2022  4 Patient will demonstrate Rt  knee AROM 0-120 degrees to facilitate ability to perform transfers, sitting, ambulation, stair navigation s restriction due to mobility. 03/27/2022 Ongoing 02/04/2022  5 Patient will demonstrate Rt LE MMT 5/5, within 15% dynamometry readings throughout to facilitate ability to perform usual standing, walking, stairs at PLOF s limitation due to symptoms. 03/27/2022 Ongoing 02/04/2022  6 Patient will demonstrate bilateral SLS > 30 seconds s deviation.  03/27/2022 Ongoing 02/04/2022  7 Pt will demonstrate ascending/descending stairs reciprocal gait pattern s UE assist for household navigation.    03/27/2022 Ongoing 02/04/2022    PLAN: PT FREQUENCY: 2x/week   PT DURATION: 10 weeks   PLANNED INTERVENTIONS: Therapeutic exercises, Therapeutic activity, Neuro Muscular re-education, Balance training, Gait training, Patient/Family education, Joint  mobilization, Stair training, DME instructions, Dry Needling, Electrical stimulation, Cryotherapy, Moist heat, Taping, Ultrasound, Ionotophoresis 56m/ml Dexamethasone, and Manual therapy.  All included unless contraindicated   PLAN FOR NEXT SESSION: BFR use continued, balance, strengthening progression.   Able to perform leg press/squats to approx. 90. No OKC loaded   MScot Jun PT, DPT, OCS, ATC 02/24/22  8:10 AM

## 2022-02-24 ENCOUNTER — Other Ambulatory Visit: Payer: Self-pay

## 2022-02-24 ENCOUNTER — Encounter: Payer: Self-pay | Admitting: Rehabilitative and Restorative Service Providers"

## 2022-02-24 ENCOUNTER — Ambulatory Visit (INDEPENDENT_AMBULATORY_CARE_PROVIDER_SITE_OTHER): Payer: PRIVATE HEALTH INSURANCE | Admitting: Rehabilitative and Restorative Service Providers"

## 2022-02-24 DIAGNOSIS — R262 Difficulty in walking, not elsewhere classified: Secondary | ICD-10-CM

## 2022-02-24 DIAGNOSIS — M25661 Stiffness of right knee, not elsewhere classified: Secondary | ICD-10-CM | POA: Diagnosis not present

## 2022-02-24 DIAGNOSIS — M25561 Pain in right knee: Secondary | ICD-10-CM | POA: Diagnosis not present

## 2022-02-24 DIAGNOSIS — M6281 Muscle weakness (generalized): Secondary | ICD-10-CM | POA: Diagnosis not present

## 2022-02-24 DIAGNOSIS — R600 Localized edema: Secondary | ICD-10-CM

## 2022-02-24 NOTE — Therapy (Signed)
OUTPATIENT PHYSICAL THERAPY TREATMENT NOTE    Patient Name: Leroy Griffith MRN: [id number] DOB:[birth year], 36 y.o., male Today's Date: 02/24/2022  PCP: Leroy Griffith, No REFERRING PROVIDER: Meredith Pel, MD   Past Medical History:  Diagnosis Date   GERD (gastroesophageal reflux disease)    Sleep apnea    Past Surgical History:  Procedure Laterality Date   ANTERIOR CRUCIATE LIGAMENT REPAIR Right 01/07/2022   Procedure: RIGHT KNEE ANTERIOR CRUCIATE LIGAMENT RECONSTRUCTION QUAD AUTOGRAFT, PARTIAL MEDIAL MENISCECTOMY;  Surgeon: Leroy Pel, MD;  Location: [place];  Service: Orthopedics;  Laterality: Right;   Patient Active Problem List   Diagnosis Date Noted   Rupture of anterior cruciate ligament of right knee    Acute medial meniscus tear of right knee     REFERRING PROVIDER: Meredith Pel, MD   REFERRING DIAG: [phone] (ICD-10-CM) - Rupture of anterior cruciate ligament of right knee, initial encounter  THERAPY DIAG:  Difficulty in walking, not elsewhere classified  Stiffness of right knee, not elsewhere classified  Acute pain of right knee  Muscle weakness (generalized)  Localized edema  PERTINENT HISTORY: unremarkable  PRECAUTIONS: 6 weeks plus out. No OKC loaded   WEIGHT BEARING RESTRICTIONS None at this time.   SUBJECTIVE:  Leroy Griffith reports continued good HEP compliance.   PAIN:  Are you having pain? No pain at rest NPRS scale:0-2/10 over the past week Pain location: Rt knee  Pain orientation: Right  PAIN TYPE: surgery  Pain description: with prolonged postures and at the end of the day Aggravating factors: See above (ache) Relieving factors: Rest and ice as needed.   OBJECTIVE:        PATIENT SURVEYS:  FOTO 01/16/2022 intake:  43 predicted:  75   COGNITION:          01/16/2022: Overall cognitive status: Within functional limits for tasks assessed                            SENSATION:          01/16/2022: Light touch: Appears intact    Edema/Atrophy:          01/16/2022: jt measurement Lt: 14.5 inch   Rt: 16.5 inch                          Quad 6 inches proximal to superior patellar border Lt: 21 inch    Rt: 20.5 inch     PALPATION: 01/16/2022: Mild tenderness around incisions, distal quad insertion   LE AROM/PROM:   A/PROM Right 01/16/2022 Left 01/16/2022 Right 01/28/2022 Right 02/04/2022 Right 02/18/2022  Hip flexion         Hip extension         Hip abduction         Hip adduction         Hip internal rotation         Hip external rotation         Knee flexion AROM in supine heel slide: 90   PROM in supine heel slide: 93 AROM in supine heel slide: 146 AROM in supine heel slide: 114 degrees AROM in supine Heel slide: 115 degrees AROM in supine heel slide 130 degrees  Knee extension AROM in LAQ: -50 c pain noted PROM in supine heel prop -2   AROM in LAQ:  2 degrees hyperextension   AROM in sitting LAQ: -5 degrees AROM in supine -5 degrees  Ankle dorsiflexion         Ankle plantarflexion         Ankle inversion         Ankle eversion          (Blank rows = not tested)   LE MMT:   MMT   Right 01/16/2022 Left 01/16/2022 Right 01/28/2022 Right 02/21/2022 Left 02/21/2022  Hip flexion 4/5 5/5     Hip extension         Hip abduction         Hip adduction         Hip internal rotation         Hip external rotation         Knee flexion 2/5 5/5     Knee extension 1+ 5/5 3/5 (no resistance testing today) 4/5 36, 33 lbs 5/5 91.9, 93.5 lbs   Ankle dorsiflexion 5/5 5/5     Ankle plantarflexion         Ankle inversion 5/5 5/5     Ankle eversion 5/5 5/5      (Blank rows = not tested)       FUNCTIONAL TESTS:  02/11/2022:  Rt SLS: 25 seconds  01/28/2022: able to perform SLR x 15 c good control( Requirement noted from MD note for ambulation without immobilizer  01/16/2022:  Rt SLS: unable   GAIT: 01/28/2022: Ambulation into clinic without immobilizer, decreased stance on Lt, lacking full TKE in terminal  swing  01/20/2022: Immobilizer Rt knee c no crutches noted  01/16/2022 Assistive device utilized: axillary crutch Lt side, immobilizer Rt knee Level of assistance: Modified independence Comments: Decreased stance on Rt leg, immobilizer limits as expected.        Today's Treatment:  02/24/2022  Therapeutic Exercises: Stationary bike Seat 6 Level 4, 6 mins of 30 sec on /off interval at 100 rpm fast/75 rpm off time), 2 mins cool down lvl 2 (10 mins total)  BFR: 196 mmHg, exercise 150 mmHg LOP, cuff size 4 Rt leg  Ball Squats with 10 mini-bounces 15X with BFR  Hamstrings curls with bridges supine 12X with 5 seconds with BFR  Functional Activities:  Step-down off 6 inch step 2 sets of 10 VERY slow eccentrics B (for stairs, curbs and sit to stand)  Sit to stand with slow eccentrics 10X  Neuromuscular re-education:  Single-leg stance: eyes open; head turning; eyes closed; on foam 2X 20 seconds each   Y balance reaches SLS c light touch x 10 each way bilateral     02/21/2022:   BFR   : 196 mmHg, exercise 150 mmHg LOP, cuff size 4 Rt leg  Therex:       Recumbent bike 2 min warm up lvl 4, 6 mins of 30 sec on /off interval at 100 rpm fast/75 rpm off time), 2 mins cool down lvl 2 (10 mins total0   Spanish squat c strap around knees BFR 150 mmHg 30, 3 x 15   Fwd step up/down 6 inch 3 x 15  Rt leg c BFR 150 mmHg   Single leg dead lift  x 20 bilateral to 6 inch cone Leg press to 90 degrees knee flexion - Rt LE only 68 lbs (BFR 160mHg: x 30, 3x15) (increase weight next visit)   Incline calf stretch bilateral 30 sec x 3     Neuro re-ed:  Reactive blazepod Lateral stepping SL loading within 2 second limit 30 second bout x 4 bilateral   Y balance reaches  SLS c light touch x 10 each way bilateral   Fitter board fwd/back double leg 20x light touching, SL x 20 bilateral   02/18/2022:   BFR   : 196 mmHg, exercise 150 mmHg LOP, cuff size 4 Rt leg  Therex:       Recumbent bike 2 min warm up  lvl 4, 6 mins of 30 sec on /off interval at 100 rpm fast/75 rpm off time), 2 mins cool down lvl 2 (10 mins total0   Leg press to 70 degrees knee flexion - Rt LE only 68 lbs (BFR 179mHg: x 30, 3x15) (increase weight next visit)   Fwd step up/down 6 inch 3 x 15  Rt leg c BFR 150 mmHg        Neuro re-ed:  Reactive blazepod Lateral stepping SL loading within 2 second limit 30 second bout x 4 bilateral   Reactive blazepod taps in SLS on airex foam 4 pods 30 sec x 4 bilateral  Vasopneumatic: 10 mins medium 34 degrees Rt knee       PATIENT EDUCATION:  01/16/2022:  Education details: HEP, POC, precautions Person educated: Patient Education method: EConsulting civil engineer Demonstration, Verbal cues, and Handouts Education comprehension: verbalized understanding and returned demonstration     HOME EXERCISE PROGRAM: Access Code: [email or web address]      ASSESSMENT:   CLINICAL IMPRESSION: Leroy Griffith reports his HEP compliance has been "pretty good, but it could be better."  Significant R thigh atrophy is noted and signs of R thigh (quadriceps and hamstrings) fatigue is noted with exercises and activities.  Continue strength and proprioceptive work to meet LTGs.    REHAB POTENTIAL: Good   CLINICAL DECISION MAKING: Stable/uncomplicated   EVALUATION COMPLEXITY: Low     GOALS: Goals reviewed with patient? Yes   SHORT TERM GOALS:  assessed 01/20/2022 STG Name Target Date Goal status  1 Patient will demonstrate independent use of home exercise program to maintain progress from in clinic treatments.   02/06/2022 Met  2 Patient will demonstrate safe independent ambulation community distances  02/06/2022 Met Assessed 01/30/2022                                                 LONG TERM GOALS:    LTG Name Target Date Goal status  1 Patient will demonstrate/report pain at worst less than or equal to 2/10 to facilitate minimal limitation in daily activity secondary to pain symptoms.  03/27/2022 Ongoing 02/04/2022  2  Patient will demonstrate independent use of home exercise program to facilitate ability to maintain/progress functional gains from skilled physical therapy services.  03/27/2022 Ongoing 02/04/2022  3 Patient will demonstrate FOTO outcome > or = 75% to indicated reduced disability due to condition. 03/27/2022 Ongoing 02/04/2022  4 Patient will demonstrate Rt  knee AROM 0-120 degrees to facilitate ability to perform transfers, sitting, ambulation, stair navigation s restriction due to mobility. 03/27/2022 Ongoing 02/04/2022  5 Patient will demonstrate Rt LE MMT 5/5, within 15% dynamometry readings throughout to facilitate ability to perform usual standing, walking, stairs at PLOF s limitation due to symptoms. 03/27/2022 Ongoing 02/04/2022  6 Patient will demonstrate bilateral SLS > 30 seconds s deviation.  03/27/2022 Ongoing 02/04/2022  7 Pt will demonstrate ascending/descending stairs reciprocal gait pattern s UE assist for household navigation.    03/27/2022 Ongoing 02/04/2022    PLAN: PT FREQUENCY: 2x/week  PT DURATION: 10 weeks   PLANNED INTERVENTIONS: Therapeutic exercises, Therapeutic activity, Neuro Muscular re-education, Balance training, Gait training, Patient/Family education, Joint mobilization, Stair training, DME instructions, Dry Needling, Electrical stimulation, Cryotherapy, Moist heat, Taping, Ultrasound, Ionotophoresis 80m/ml Dexamethasone, and Manual therapy.  All included unless contraindicated   PLAN FOR NEXT SESSION: BFR use continued, balance, strengthening (quadriceps and hamstrings) progression.  Able to perform leg press/squats to approx. 90. No OKC loaded.   RFarley LyPT, MPT 02/24/22  9:46 AM

## 2022-02-26 ENCOUNTER — Encounter: Payer: Self-pay | Admitting: Orthopedic Surgery

## 2022-02-26 ENCOUNTER — Ambulatory Visit: Payer: No Typology Code available for payment source | Admitting: Mental Health

## 2022-02-26 ENCOUNTER — Other Ambulatory Visit: Payer: Self-pay

## 2022-02-26 ENCOUNTER — Ambulatory Visit (INDEPENDENT_AMBULATORY_CARE_PROVIDER_SITE_OTHER): Payer: PRIVATE HEALTH INSURANCE | Admitting: Orthopedic Surgery

## 2022-02-26 DIAGNOSIS — S83511A Sprain of anterior cruciate ligament of right knee, initial encounter: Secondary | ICD-10-CM

## 2022-02-26 NOTE — Progress Notes (Signed)
? ?  Post-Op Visit Note ?  ?Patient: Leroy Griffith           ?Date of Birth: [birth year]           ?MRN: [id number] ?Visit Date: 02/26/2022 ?PCP: Pcp, No ? ? ?Assessment & Plan: ? ?Chief Complaint: No chief complaint on file. ? ?Visit Diagnoses:  ?1. Rupture of anterior cruciate ligament of right knee, initial encounter   ? ? ?Plan: Leroy Griffith is a 36 year old patient who is now about 6 weeks out right knee ACL reconstruction with posterior medial meniscectomy.  Doing some better.  Has been going to physical therapy.  On examination he has range of motion of 3 degrees to 130.  Graft stable.  Trace effusion.  No calf tenderness negative Homans.  Was able to mow the yard last weekend.  Doing leg press bike balance exercises with hamstring strengthening as well.  He is able to get up and down the stairs.  Was recently diagnosed with mild sleep apnea.  Plan at this time is to continue to work on strengthening with close chain kinetic exercises.  Would not really do any cutting or pivoting yet.  Quad strength is improving as well.  Follow-up in 6 weeks for clinical recheck. ? ?Follow-Up Instructions: Return in about 6 weeks (around 04/09/2022).  ? ?Orders:  ?No orders of the defined types were placed in this encounter. ? ?No orders of the defined types were placed in this encounter. ? ? ?Imaging: ?No results found. ? ?PMFS History: ?Patient Active Problem List  ? Diagnosis Date Noted  ? Rupture of anterior cruciate ligament of right knee   ? Acute medial meniscus tear of right knee   ? ?Past Medical History:  ?Diagnosis Date  ? GERD (gastroesophageal reflux disease)   ? Sleep apnea   ?  ?History reviewed. No pertinent family history.  ?Past Surgical History:  ?Procedure Laterality Date  ? ANTERIOR CRUCIATE LIGAMENT REPAIR Right 01/07/2022  ? Procedure: RIGHT KNEE ANTERIOR CRUCIATE LIGAMENT RECONSTRUCTION QUAD AUTOGRAFT, PARTIAL MEDIAL MENISCECTOMY;  Surgeon: Cammy Copa, MD;  Location: [facility];  Service: Orthopedics;  Laterality:  Right;  ? ?Social History  ? ?Occupational History  ? Not on file  ?Tobacco Use  ? Smoking status: Never  ? Smokeless tobacco: Never  ?Vaping Use  ? Vaping Use: Never used  ?Substance and Sexual Activity  ? Alcohol use: Yes  ?  Comment: occassionally  ? Drug use: Never  ? Sexual activity: Not on file  ? ? ? ?

## 2022-03-07 ENCOUNTER — Ambulatory Visit (INDEPENDENT_AMBULATORY_CARE_PROVIDER_SITE_OTHER): Payer: PRIVATE HEALTH INSURANCE | Admitting: Rehabilitative and Restorative Service Providers"

## 2022-03-07 ENCOUNTER — Encounter: Payer: Self-pay | Admitting: Rehabilitative and Restorative Service Providers"

## 2022-03-07 ENCOUNTER — Other Ambulatory Visit: Payer: Self-pay

## 2022-03-07 DIAGNOSIS — M25561 Pain in right knee: Secondary | ICD-10-CM | POA: Diagnosis not present

## 2022-03-07 DIAGNOSIS — M25661 Stiffness of right knee, not elsewhere classified: Secondary | ICD-10-CM | POA: Diagnosis not present

## 2022-03-07 DIAGNOSIS — M6281 Muscle weakness (generalized): Secondary | ICD-10-CM

## 2022-03-07 DIAGNOSIS — R600 Localized edema: Secondary | ICD-10-CM

## 2022-03-07 DIAGNOSIS — R262 Difficulty in walking, not elsewhere classified: Secondary | ICD-10-CM | POA: Diagnosis not present

## 2022-03-07 NOTE — Therapy (Addendum)
?OUTPATIENT PHYSICAL THERAPY TREATMENT NOTE  ? ? ?Patient Name: Leroy Griffith ?MRN: [id number] ?DOB:[birth year], 36 y.o., male ?Today's Date: 03/07/2022 ? ?PCP: Pcp, No ?REFERRING PROVIDER: Cammy Copa, MD ? ? ? 03/07/22 0817  ?PT Visits / Re-Eval  ?Visit Number 13  ?Number of Visits 20  ?Date for PT Re-Evaluation 03/27/22  ?Authorization  ?Authorization Type Workers Compensation  ?Authorization - Visit Number 13  ?Authorization - Number of Visits 16  ?Progress Note Due on Visit 15 ?(in prep for more visits from workers comp)  ?PT Time Calculation  ?PT Start Time 0800  ?PT Stop Time 0850  ?PT Time Calculation (min) 50 min  ?PT - End of Session  ?Activity Tolerance Patient tolerated treatment well  ?Behavior During Therapy [facility] for tasks assessed/performed  ? ? ? ? ?Past Medical History:  ?Diagnosis Date  ? GERD (gastroesophageal reflux disease)   ? Sleep apnea   ? ?Past Surgical History:  ?Procedure Laterality Date  ? ANTERIOR CRUCIATE LIGAMENT REPAIR Right 01/07/2022  ? Procedure: RIGHT KNEE ANTERIOR CRUCIATE LIGAMENT RECONSTRUCTION QUAD AUTOGRAFT, PARTIAL MEDIAL MENISCECTOMY;  Surgeon: Cammy Copa, MD;  Location: [facility] OR;  Service: Orthopedics;  Laterality: Right;  ? ?Patient Active Problem List  ? Diagnosis Date Noted  ? Rupture of anterior cruciate ligament of right knee   ? Acute medial meniscus tear of right knee   ? ? ?REFERRING PROVIDER: Cammy Copa, MD ?  ?REFERRING DIAG: H82.993Z (ICD-10-CM) - Rupture of anterior cruciate ligament of right knee, initial encounter ? ?THERAPY DIAG:  ?Acute pain of right knee ? ?Stiffness of right knee, not elsewhere classified ? ?Muscle weakness (generalized) ? ?Difficulty in walking, not elsewhere classified ? ?Localized edema ? ?PERTINENT HISTORY: unremarkable ? ?PRECAUTIONS: 6 weeks plus out. No OKC loaded ?  ?WEIGHT BEARING RESTRICTIONS None at this time.  ? ?SUBJECTIVE:  Pt indicated a slip that occurred one day that created some foot pain near big toe.   Pt indicated the knee felt fine after a little complaint on inside of knee for short period of time.  ? ? ?PAIN:  ?Are you having pain? No pain at rest ?NPRS scale:0/10 ?Pain location: Rt knee  ?Pain orientation: Right  ?PAIN TYPE: surgery  ?Pain description:  ?Aggravating factors:  ?Relieving factors: icing continued  ? ? ?OBJECTIVE:  ?  ?  ?  ?PATIENT SURVEYS:  ?01/16/2022 FOTO intake:  43 predicted:  75 ?  ?COGNITION: ?         01/16/2022: Overall cognitive status: Within functional limits for tasks assessed                  ?          ?SENSATION: ?         01/16/2022: Light touch: Appears intact ?  ?Edema/Atrophy: ?         01/16/2022: jt measurement Lt: 14.5 inch   Rt: 16.5 inch ?                         Quad 6 inches proximal to superior patellar border Lt: 21 inch    Rt: 20.5 inch ?  ?  ?PALPATION: ?01/16/2022: Mild tenderness around incisions, distal quad insertion ?  ?LE AROM/PROM: ?  ?A/PROM Right ?01/16/2022 Left ?01/16/2022 Right ?01/28/2022 Right ?02/04/2022 Right ?02/18/2022  ?Hip flexion         ?Hip extension         ?Hip abduction         ?  Hip adduction         ?Hip internal rotation         ?Hip external rotation         ?Knee flexion AROM in supine heel slide: 90 ?  ?PROM in supine heel slide: 93 AROM in supine heel slide: 146 AROM in supine heel slide: 114 degrees AROM in supine ?Heel slide: 115 degrees AROM in supine heel slide 130 degrees  ?Knee extension AROM in LAQ: -50 c pain noted ?PROM in supine heel prop -2 ?  AROM in LAQ:  2 degrees hyperextension ?  AROM in sitting LAQ: -5 degrees AROM in supine ?-5 degrees   ?Ankle dorsiflexion         ?Ankle plantarflexion         ?Ankle inversion         ?Ankle eversion         ? (Blank rows = not tested) ?  ?LE MMT: ?  ?MMT ?  Right ?01/16/2022 Left ?01/16/2022 Right ?01/28/2022 Right ?02/21/2022 Left ?02/21/2022 Right ?03/07/2022  ?Hip flexion 4/5 5/5      ?Hip extension          ?Hip abduction          ?Hip adduction          ?Hip internal rotation          ?Hip external  rotation          ?Knee flexion 2/5 5/5      ?Knee extension 1+ 5/5 3/5 (no resistance testing today) 4/5 ?36, 33 lbs 5/5 ?91.9, 93.5 lbs  5/5 ?46, 41 lbs  ?Ankle dorsiflexion 5/5 5/5      ?Ankle plantarflexion          ?Ankle inversion 5/5 5/5      ?Ankle eversion 5/5 5/5      ? (Blank rows = not tested) ?  ?  ?  ?FUNCTIONAL TESTS:  ?02/11/2022:  Rt SLS: 25 seconds ? ?01/28/2022: able to perform SLR x 15 c good control( Requirement noted from MD note for ambulation without immobilizer ? ?01/16/2022:  Rt SLS: unable ?  ?GAIT: ?01/28/2022: Ambulation into clinic without immobilizer, decreased stance on Lt, lacking full TKE in terminal swing ? ?01/20/2022: Immobilizer Rt knee c no crutches noted ? ? ?  ?Today's Treatment:  ?03/07/2022:  ? ?BFR   : 196 mmHg, exercise 150 mmHg LOP, cuff size 4 Rt leg ? ?Therex:       Recumbent bike 2 min warm up lvl 5, 6 mins of 30 sec on /off interval at 100 rpm fast/75 rpm off time), 2 mins cool down lvl 2 (10 mins total0 ?  Single leg dead lift  x 20 bilateral to 6 inch cone, BFR 150 mmHg on Rt ?  Seated SLR 2 x 15 c BFR 150 mmHg Rt ? ?TherActivity -Performed to improve functional control in stair and squat movements.  ?Leg press to 90 degrees knee flexion - Rt LE only 75 lbs (BFR 150mmHg: x 30, 3x15) (increase weight next visit) ?Lateral step down eccentric lowering on Rt leg 2 x 15 ?   ?   ?Neuro re-ed:   ?  SLS c contralateral slider fwd, lateral, reverse x 10 bilateral ?  Fitter board fwd/back double leg 20x light touching, SL x 20 bilateral ? ?Vasopneumatic ?Rt knee 10 mins medium compression 34 deg ? ?02/24/2022 ? ?Therapeutic Exercises: ?Stationary bike Seat 6 Level 4, 6 mins of 30 sec on /  off interval at 100 rpm fast/75 rpm off time), 2 mins cool down lvl 2 (10 mins total) ? ?BFR: 196 mmHg, exercise 150 mmHg LOP, cuff size 4 Rt leg ? ?Ball Squats with 10 mini-bounces 15X with BFR ? ?Hamstrings curls with bridges supine 12X with 5 seconds with BFR ? ?Functional Activities: ? ?Step-down  off 6 inch step 2 sets of 10 VERY slow eccentrics B (for stairs, curbs and sit to stand) ? ?Sit to stand with slow eccentrics 10X ? ?Neuromuscular re-education: ? ?Single-leg stance: eyes open; head turning; eyes closed; on foam 2X 20 seconds each ? ? ?Y balance reaches SLS c light touch x 10 each way bilateral ?   ? ?02/21/2022:  ? ?BFR   : 196 mmHg, exercise 150 mmHg LOP, cuff size 4 Rt leg ? ?Therex:       Recumbent bike 2 min warm up lvl 4, 6 mins of 30 sec on /off interval at 100 rpm fast/75 rpm off time), 2 mins cool down lvl 2 (10 mins total0 ?  Spanish squat c strap around knees BFR 150 mmHg 30, 3 x 15 ?  Fwd step up/down 6 inch 3 x 15  Rt leg c BFR 150 mmHg ?  Single leg dead lift  x 20 bilateral to 6 inch cone ?Leg press to 90 degrees knee flexion - Rt LE only 68 lbs (BFR : x 30, 3x15) (increase weight next visit) ?  Incline calf stretch bilateral 30 sec x 3 ?   ? ?Neuro re-ed:  Reactive blazepod Lateral stepping SL loading within 2 second limit 30 second bout x 4 bilateral ?  Y balance reaches SLS c light touch x 10 each way bilateral ?  Fitter board fwd/back double leg 20x light touching, SL x 20 bilateral ? ? ?02/18/2022:  ? ?BFR   : 196 mmHg, exercise 150 mmHg LOP, cuff size 4 Rt leg ? ?Therex:       Recumbent bike 2 min warm up lvl 4, 6 mins of 30 sec on /off interval at 100 rpm fast/75 rpm off time), 2 mins cool down lvl 2 (10 mins total0 ?  Leg press to 70 degrees knee flexion - Rt LE only 68 lbs (BFR : x 30, 3x15) (increase weight next visit) ?  Fwd step up/down 6 inch 3 x 15  Rt leg c BFR 150 mmHg ?   ?   ? ?Neuro re-ed:  Reactive blazepod Lateral stepping SL loading within 2 second limit 30 second bout x 4 bilateral ?  Reactive blazepod taps in SLS on airex foam 4 pods 30 sec x 4 bilateral ? ?Vasopneumatic: 10 mins medium 34 degrees Rt knee ?   ? ?  ?PATIENT EDUCATION:  ?01/16/2022:  ?Education details: HEP, POC, precautions ?Person educated: Patient ?Education method: Explanation,  Demonstration, Verbal cues, and Handouts ?Education comprehension: verbalized understanding and returned demonstration ?  ?  ?HOME EXERCISE PROGRAM: ?Access Code: [email or web address] ? ?  ?  ?ASSESSMENT: ?  ?CLINICAL

## 2022-03-11 ENCOUNTER — Encounter: Payer: Self-pay | Admitting: Rehabilitative and Restorative Service Providers"

## 2022-03-11 ENCOUNTER — Ambulatory Visit (INDEPENDENT_AMBULATORY_CARE_PROVIDER_SITE_OTHER): Payer: No Typology Code available for payment source | Admitting: Rehabilitative and Restorative Service Providers"

## 2022-03-11 ENCOUNTER — Other Ambulatory Visit: Payer: Self-pay

## 2022-03-11 DIAGNOSIS — R600 Localized edema: Secondary | ICD-10-CM

## 2022-03-11 DIAGNOSIS — R262 Difficulty in walking, not elsewhere classified: Secondary | ICD-10-CM | POA: Diagnosis not present

## 2022-03-11 DIAGNOSIS — M6281 Muscle weakness (generalized): Secondary | ICD-10-CM

## 2022-03-11 DIAGNOSIS — M25661 Stiffness of right knee, not elsewhere classified: Secondary | ICD-10-CM | POA: Diagnosis not present

## 2022-03-11 DIAGNOSIS — M25561 Pain in right knee: Secondary | ICD-10-CM

## 2022-03-11 NOTE — Therapy (Addendum)
?OUTPATIENT PHYSICAL THERAPY TREATMENT NOTE  ? ? ?Patient Name: Leroy Griffith ?MRN: [id number] ?DOB:May 18, 1986, 36 y.o., male ?Today's Date: 03/11/2022 ? ?PCP: Pcp, No ?REFERRING PROVIDER: Cammy Copa, MD ? ? ? ? 03/11/22 0855  ?PT Visits / Re-Eval  ?Visit Number 14  ?Number of Visits 20  ?Date for PT Re-Evaluation 03/27/22  ?Authorization  ?Authorization Type Workers Compensation  ?Authorization - Visit Number 14  ?Authorization - Number of Visits 16  ?Progress Note Due on Visit 15 ?(in prep for more visits from workers comp)  ?PT Time Calculation  ?PT Start Time 0848  ?PT Stop Time 0929  ?PT Time Calculation (min) 41 min  ?PT - End of Session  ?Activity Tolerance Patient tolerated treatment well  ?Behavior During Therapy [facility] for tasks assessed/performed  ? ? ? ? ? ?Past Medical History:  ?Diagnosis Date  ? GERD (gastroesophageal reflux disease)   ? Sleep apnea   ? ?Past Surgical History:  ?Procedure Laterality Date  ? ANTERIOR CRUCIATE LIGAMENT REPAIR Right 01/07/2022  ? Procedure: RIGHT KNEE ANTERIOR CRUCIATE LIGAMENT RECONSTRUCTION QUAD AUTOGRAFT, PARTIAL MEDIAL MENISCECTOMY;  Surgeon: Cammy Copa, MD;  Location: [facility] OR;  Service: Orthopedics;  Laterality: Right;  ? ?Patient Active Problem List  ? Diagnosis Date Noted  ? Rupture of anterior cruciate ligament of right knee   ? Acute medial meniscus tear of right knee   ? ? ?REFERRING PROVIDER: Cammy Copa, MD ?  ?REFERRING DIAG: P61.950D (ICD-10-CM) - Rupture of anterior cruciate ligament of right knee, initial encounter ? ?THERAPY DIAG:  ?Acute pain of right knee ? ?Stiffness of right knee, not elsewhere classified ? ?Muscle weakness (generalized) ? ?Difficulty in walking, not elsewhere classified ? ?Localized edema ? ?PERTINENT HISTORY: unremarkable ? ?PRECAUTIONS: 6 weeks plus out. No OKC loaded ?  ?WEIGHT BEARING RESTRICTIONS None at this time.  ? ?SUBJECTIVE:  Pt indicated feeling some tightness in medial knee joint line c step up activity  but not as much on step down movements.   ? ? ?PAIN:  ?Are you having pain? No pain at rest ?NPRS scale:0/10 ?Pain location: Rt knee  ?Pain orientation: Right  ?PAIN TYPE: surgery  ?Pain description:  ?Aggravating factors:  ?Relieving factors: icing continued  ? ? ?OBJECTIVE:  ?  ?  ?  ?PATIENT SURVEYS:  ?01/16/2022 FOTO intake:  43 predicted:  75 ?  ?COGNITION: ?         01/16/2022: Overall cognitive status: Within functional limits for tasks assessed                  ?          ?SENSATION: ?         01/16/2022: Light touch: Appears intact ?  ?Edema/Atrophy: ?         01/16/2022: jt measurement Lt: 14.5 inch   Rt: 16.5 inch ?                         Quad 6 inches proximal to superior patellar border Lt: 21 inch    Rt: 20.5 inch ?  ?  ?PALPATION: ?01/16/2022: Mild tenderness around incisions, distal quad insertion ?  ?LE AROM/PROM: ?  ?A/PROM Right ?01/16/2022 Left ?01/16/2022 Right ?01/28/2022 Right ?02/04/2022 Right ?02/18/2022  ?Hip flexion         ?Hip extension         ?Hip abduction         ?Hip adduction         ?  Hip internal rotation         ?Hip external rotation         ?Knee flexion AROM in supine heel slide: 90 ?  ?PROM in supine heel slide: 93 AROM in supine heel slide: 146 AROM in supine heel slide: 114 degrees AROM in supine ?Heel slide: 115 degrees AROM in supine heel slide 130 degrees  ?Knee extension AROM in LAQ: -50 c pain noted ?PROM in supine heel prop -2 ?  AROM in LAQ:  2 degrees hyperextension ?  AROM in sitting LAQ: -5 degrees AROM in supine ?-5 degrees   ?Ankle dorsiflexion         ?Ankle plantarflexion         ?Ankle inversion         ?Ankle eversion         ? (Blank rows = not tested) ?  ?LE MMT: ?  ?MMT ?  Right ?01/16/2022 Left ?01/16/2022 Right ?01/28/2022 Right ?02/21/2022 Left ?02/21/2022 Right ?03/07/2022  ?Hip flexion 4/5 5/5      ?Hip extension          ?Hip abduction          ?Hip adduction          ?Hip internal rotation          ?Hip external rotation          ?Knee flexion 2/5 5/5      ?Knee extension  1+ 5/5 3/5 (no resistance testing today) 4/5 ?36, 33 lbs 5/5 ?91.9, 93.5 lbs  5/5 ?46, 41 lbs  ?Ankle dorsiflexion 5/5 5/5      ?Ankle plantarflexion          ?Ankle inversion 5/5 5/5      ?Ankle eversion 5/5 5/5      ? (Blank rows = not tested) ?  ?  ?  ?FUNCTIONAL TESTS:  ?02/11/2022:  Rt SLS: 25 seconds ? ?01/28/2022: able to perform SLR x 15 c good control( Requirement noted from MD note for ambulation without immobilizer ? ?01/16/2022:  Rt SLS: unable ?  ?GAIT: ?01/28/2022: Ambulation into clinic without immobilizer, decreased stance on Lt, lacking full TKE in terminal swing ? ?01/20/2022: Immobilizer Rt knee c no crutches noted ? ? ?  ?Today's Treatment:  ?03/11/2022:  ? ?BFR   : 196 mmHg, exercise 150 mmHg LOP, cuff size 4 Rt leg ? ?Therex:       Recumbent bike 2 min warm up lvl 5, 6 mins of 30 sec on /off interval at 100 rpm fast/75 rpm off time), 2 mins cool down lvl 2 (10 mins total0 ?  Spanish squat c strap around knees BFR 150 mmHg 30, 3 x 15 ?   ? ?TherActivity -Performed to improve functional control in stair and squat movements.  ?Leg press to 90 degrees knee flexion - Rt LE only 82 lbs (BFR : x 30, 3x15) (increase weight next visit) ?4 inch step on/over/down on Rt leg slow controlled movement  ?   ?   ?Neuro re-ed:   ?  SLS y balance reaches c contralateral leg light touch x 10 bilateral ?  SLS c kettle bell lateral pass off hand to hand 10 lbs 30 x 2 bilateral ? ? ?03/07/2022:  ? ?BFR   : 196 mmHg, exercise 150 mmHg LOP, cuff size 4 Rt leg ? ?Therex:       Recumbent bike 2 min warm up lvl 5, 6 mins of 30 sec on /off interval  at 100 rpm fast/75 rpm off time), 2 mins cool down lvl 2 (10 mins total0 ?  Single leg dead lift  x 20 bilateral to 6 inch cone, BFR 150 mmHg on Rt ?  Seated SLR 2 x 15 c BFR 150 mmHg Rt ? ?TherActivity -Performed to improve functional control in stair and squat movements.  ?Leg press to 90 degrees knee flexion - Rt LE only 75 lbs (BFR 150mmHg: x 30, 3x15) (increase weight next  visit) ?Lateral step down eccentric lowering on Rt leg 2 x 15 ?   ?   ?Neuro re-ed:   ?  SLS c contralateral slider fwd, lateral, reverse x 10 bilateral ?  Fitter board fwd/back double leg 20x light touching, SL x 20 bilateral ? ?Vasopneumatic ?Rt knee 10 mins medium compression 34 deg ? ?02/24/2022 ? ?Therapeutic Exercises: ?Stationary bike Seat 6 Level 4, 6 mins of 30 sec on /off interval at 100 rpm fast/75 rpm off time), 2 mins cool down lvl 2 (10 mins total) ? ?BFR: 196 mmHg, exercise 150 mmHg LOP, cuff size 4 Rt leg ? ?Ball Squats with 10 mini-bounces 15X with BFR ? ?Hamstrings curls with bridges supine 12X with 5 seconds with BFR ? ?Functional Activities: ? ?Step-down off 6 inch step 2 sets of 10 VERY slow eccentrics B (for stairs, curbs and sit to stand) ? ?Sit to stand with slow eccentrics 10X ? ?Neuromuscular re-education: ? ?Single-leg stance: eyes open; head turning; eyes closed; on foam 2X 20 seconds each ? ? ?Y balance reaches SLS c light touch x 10 each way bilateral ?   ? ?02/21/2022:  ? ?BFR   : 196 mmHg, exercise 150 mmHg LOP, cuff size 4 Rt leg ? ?Therex:       Recumbent bike 2 min warm up lvl 4, 6 mins of 30 sec on /off interval at 100 rpm fast/75 rpm off time), 2 mins cool down lvl 2 (10 mins total0 ?  Spanish squat c strap around knees BFR 150 mmHg 30, 3 x 15 ?  Fwd step up/down 6 inch 3 x 15  Rt leg c BFR 150 mmHg ?  Single leg dead lift  x 20 bilateral to 6 inch cone ?Leg press to 90 degrees knee flexion - Rt LE only 68 lbs (BFR 150mmHg: x 30, 3x15) (increase weight next visit) ?  Incline calf stretch bilateral 30 sec x 3 ?   ? ?Neuro re-ed:  Reactive blazepod Lateral stepping SL loading within 2 second limit 30 second bout x 4 bilateral ?  Y balance reaches SLS c light touch x 10 each way bilateral ?  Fitter board fwd/back double leg 20x light touching, SL x 20 bilateral ? ? ?  ?PATIENT EDUCATION:  ?01/16/2022:  ?Education details: HEP, POC, precautions ?Person educated: Patient ?Education  method: Explanation, Demonstration, Verbal cues, and Handouts ?Education comprehension: verbalized understanding and returned demonstration ?  ?  ?HOME EXERCISE PROGRAM: ?Access Code: [email or web address] ? ?  ?  ?ASSESS

## 2022-03-14 ENCOUNTER — Ambulatory Visit (INDEPENDENT_AMBULATORY_CARE_PROVIDER_SITE_OTHER): Payer: PRIVATE HEALTH INSURANCE | Admitting: Rehabilitative and Restorative Service Providers"

## 2022-03-14 ENCOUNTER — Other Ambulatory Visit: Payer: Self-pay

## 2022-03-14 ENCOUNTER — Encounter: Payer: Self-pay | Admitting: Rehabilitative and Restorative Service Providers"

## 2022-03-14 DIAGNOSIS — M25661 Stiffness of right knee, not elsewhere classified: Secondary | ICD-10-CM

## 2022-03-14 DIAGNOSIS — R262 Difficulty in walking, not elsewhere classified: Secondary | ICD-10-CM | POA: Diagnosis not present

## 2022-03-14 DIAGNOSIS — R600 Localized edema: Secondary | ICD-10-CM

## 2022-03-14 DIAGNOSIS — M6281 Muscle weakness (generalized): Secondary | ICD-10-CM | POA: Diagnosis not present

## 2022-03-14 DIAGNOSIS — M25561 Pain in right knee: Secondary | ICD-10-CM

## 2022-03-14 NOTE — Therapy (Signed)
?OUTPATIENT PHYSICAL THERAPY TREATMENT NOTE /PROGRESS NOTE/RECERT ? ? ?Patient Name: Leroy Griffith ?MRN: [id number] ?DOB:1986/01/30, 36 y.o., male ?Today's Date: 03/14/2022 ? ?PCP: Pcp, No ?REFERRING PROVIDER: Cammy Copa, MD ? ?Progress Note ?Reporting Period 01/16/2022 to 03/14/2022 ? ?See note below for Objective Data and Assessment of Progress/Goals.  ? ? ? PT End of Session - 03/14/22 0804   ? ? Visit Number 15   ? Number of Visits 32   ? Date for PT Re-Evaluation 05/23/22   ? Authorization Type Workers Compensation   ? Authorization - Visit Number 15   ? Authorization - Number of Visits 16   ? Progress Note Due on Visit --   ? PT Start Time 0800   ? PT Stop Time [phone]   ? PT Time Calculation (min) 42 min   ? Activity Tolerance Patient tolerated treatment well   ? Behavior During Therapy [facility] for tasks assessed/performed   ? ?  ?  ? ?  ? ? ? ? ? ?Past Medical History:  ?Diagnosis Date  ? GERD (gastroesophageal reflux disease)   ? Sleep apnea   ? ?Past Surgical History:  ?Procedure Laterality Date  ? ANTERIOR CRUCIATE LIGAMENT REPAIR Right 01/07/2022  ? Procedure: RIGHT KNEE ANTERIOR CRUCIATE LIGAMENT RECONSTRUCTION QUAD AUTOGRAFT, PARTIAL MEDIAL MENISCECTOMY;  Surgeon: Cammy Copa, MD;  Location: [facility] OR;  Service: Orthopedics;  Laterality: Right;  ? ?Patient Active Problem List  ? Diagnosis Date Noted  ? Rupture of anterior cruciate ligament of right knee   ? Acute medial meniscus tear of right knee   ? ? ?REFERRING PROVIDER: Cammy Copa, MD ?  ?REFERRING DIAG: D97.416L (ICD-10-CM) - Rupture of anterior cruciate ligament of right knee, initial encounter ? ?THERAPY DIAG:  ?Acute pain of right knee ? ?Stiffness of right knee, not elsewhere classified ? ?Muscle weakness (generalized) ? ?Difficulty in walking, not elsewhere classified ? ?Localized edema ? ?PERTINENT HISTORY: unremarkable ? ?PRECAUTIONS: 6 weeks plus out. No OKC loaded ?  ?WEIGHT BEARING RESTRICTIONS None at this time.  ? ?SUBJECTIVE:   Pt stated no pain complaints today.  Indicated he was able to walk the zoo and mow without complaints afterward.   Pt rated recovery to 75% at this time.  ? ? ?PAIN:  ?Are you having pain? No pain at rest ?NPRS scale:0/10 ?Pain location: Rt knee  ?Pain orientation: Right  ?PAIN TYPE: surgery  ?Pain description:  ?Aggravating factors:  ?Relieving factors: ? ? ?OBJECTIVE:  ?  ?  ?  ?PATIENT SURVEYS:  ? 03/14/2022:  FOTO update:  67  ?01/16/2022 FOTO intake:  43 predicted:  75 ?  ?COGNITION: ?         01/16/2022: Overall cognitive status: Within functional limits for tasks assessed                  ?          ?SENSATION: ?         01/16/2022: Light touch: Appears intact ?  ?Edema/Atrophy: ?         01/16/2022: jt measurement Lt: 14.5 inch   Rt: 16.5 inch ?                         Quad 6 inches proximal to superior patellar border Lt: 21 inch    Rt: 20.5 inch ?  ?  ?PALPATION: ?01/16/2022: Mild tenderness around incisions, distal quad insertion ?  ?LE AROM/PROM: ?  ?A/PROM Right ?  01/16/2022 Left ?01/16/2022 Right ?01/28/2022 Right ?02/04/2022 Right ?02/18/2022 Right ?03/14/2022  ?Hip flexion          ?Hip extension          ?Hip abduction          ?Hip adduction          ?Hip internal rotation          ?Hip external rotation          ?Knee flexion AROM in supine heel slide: 90 ?  ?PROM in supine heel slide: 93 AROM in supine heel slide: 146 AROM in supine heel slide: 114 degrees AROM in supine ?Heel slide: 115 degrees AROM in supine heel slide 130 degrees AROM in supine heel slide 135   ?Knee extension AROM in LAQ: -50 c pain noted ?PROM in supine heel prop -2 ?  AROM in LAQ:  2 degrees hyperextension ?  AROM in sitting LAQ: -5 degrees AROM in supine ?-5 degrees  AROM 0 deg  ?Ankle dorsiflexion          ?Ankle plantarflexion          ?Ankle inversion          ?Ankle eversion          ? (Blank rows = not tested) ?  ?LE MMT: ?  ?MMT ?  Right ?01/16/2022 Left ?01/16/2022 Right ?01/28/2022 Right ?02/21/2022 Left ?02/21/2022 Right ?03/07/2022  Right ?03/14/2022  ?Hip flexion 4/5 5/5       ?Hip extension           ?Hip abduction           ?Hip adduction           ?Hip internal rotation           ?Hip external rotation           ?Knee flexion 2/5 5/5       ?Knee extension 1+ 5/5 3/5 (no resistance testing today) 4/5 ?36, 33 lbs 5/5 ?91.9, 93.5 lbs  5/5 ?46, 41 lbs 5/5 ?57, 55 lbs  ?Ankle dorsiflexion 5/5 5/5       ?Ankle plantarflexion           ?Ankle inversion 5/5 5/5       ?Ankle eversion 5/5 5/5       ? (Blank rows = not tested) ?  ?  ?  ?FUNCTIONAL TESTS:  ?03/14/2022:  Rt SLS 30 seconds even ground ? ?02/11/2022:  Rt SLS: 25 seconds ? ?01/28/2022: able to perform SLR x 15 c good control( Requirement noted from MD note for ambulation without immobilizer ? ?01/16/2022:  Rt SLS: unable ?  ?GAIT: ?01/28/2022: Ambulation into clinic without immobilizer, decreased stance on Lt, lacking full TKE in terminal swing ? ?01/20/2022: Immobilizer Rt knee c no crutches noted ? ? ?  ?Today's Treatment:  ?03/14/2022:  ? ?BFR   : 196 mmHg, exercise 150 mmHg LOP, cuff size 4 Rt leg ? ?Therex:       Recumbent bike lvl 5 5 mins ?  Reverse lunge slider 2 x 10 bilateral ?  Lateral stepping reach c green band around ankles 3 lateral cones x 10 bilateral ?  Leg press to 90 degrees knee flexion - Rt LE only 87 lbs (BFR 150mmHg: x 30, 3x15) (increase weight next visit) ? ?Neuro re-ed:   ?  SLS fitter board rocker fwd/back 20x each bilateral ?  SLS balance on fitter rocker board 1 min attempt x 1 bilateral (  occasional HHA) ?  SLS on airex foam c 10 lb kettle bell pass side to side 2 x 20 bilateral ? ?03/11/2022:  ? ?BFR   : 196 mmHg, exercise 150 mmHg LOP, cuff size 4 Rt leg ? ?Therex:       Recumbent bike 2 min warm up lvl 5, 6 mins of 30 sec on /off interval at 100 rpm fast/75 rpm off time), 2 mins cool down lvl 2 (10 mins total) ?  Spanish squat c strap around knees BFR 150 mmHg 30, 3 x 15 ?   ? ?TherActivity -Performed to improve functional control in stair and squat movements.  ?Leg  press to 90 degrees knee flexion - Rt LE only 81 lbs (BFR : x 30, 3x15) (increase weight next visit) ?4 inch step on/over/down on Rt leg slow controlled movement  ?   ?   ?Neuro re-ed:   ?  SLS y balance reaches c contralateral leg light touch x 10 bilateral ?  SLS c kettle bell lateral pass off hand to hand 10 lbs 30 x 2 bilateral ? ? ?03/07/2022:  ? ?BFR   : 196 mmHg, exercise 150 mmHg LOP, cuff size 4 Rt leg ? ?Therex:       Recumbent bike 2 min warm up lvl 5, 6 mins of 30 sec on /off interval at 100 rpm fast/75 rpm off time), 2 mins cool down lvl 2 (10 mins total0 ?  Single leg dead lift  x 20 bilateral to 6 inch cone, BFR 150 mmHg on Rt ?  Seated SLR 2 x 15 c BFR 150 mmHg Rt ? ?TherActivity -Performed to improve functional control in stair and squat movements.  ?Leg press to 90 degrees knee flexion - Rt LE only 75 lbs (BFR : x 30, 3x15) (increase weight next visit) ?Lateral step down eccentric lowering on Rt leg 2 x 15 ?   ?   ?Neuro re-ed:   ?  SLS c contralateral slider fwd, lateral, reverse x 10 bilateral ?  Fitter board fwd/back double leg 20x light touching, SL x 20 bilateral ? ?Vasopneumatic ?Rt knee 10 mins medium compression 34 deg ? ?02/24/2022 ? ?Therapeutic Exercises: ?Stationary bike Seat 6 Level 4, 6 mins of 30 sec on /off interval at 100 rpm fast/75 rpm off time), 2 mins cool down lvl 2 (10 mins total) ? ?BFR: 196 mmHg, exercise 150 mmHg LOP, cuff size 4 Rt leg ? ?Ball Squats with 10 mini-bounces 15X with BFR ? ?Hamstrings curls with bridges supine 12X with 5 seconds with BFR ? ?Functional Activities: ? ?Step-down off 6 inch step 2 sets of 10 VERY slow eccentrics B (for stairs, curbs and sit to stand) ? ?Sit to stand with slow eccentrics 10X ? ?Neuromuscular re-education: ? ?Single-leg stance: eyes open; head turning; eyes closed; on foam 2X 20 seconds each ? ? ?Y balance reaches SLS c light touch x 10 each way bilateral ?  ?PATIENT EDUCATION:  ?01/16/2022:  ?Education details: HEP, POC,  precautions ?Person educated: Patient ?Education method: Explanation, Demonstration, Verbal cues, and Handouts ?Education comprehension: verbalized understanding and returned demonstration ?  ?  ?HOME EXERCISE

## 2022-03-19 ENCOUNTER — Encounter: Payer: Self-pay | Admitting: Rehabilitative and Restorative Service Providers"

## 2022-03-19 ENCOUNTER — Ambulatory Visit (INDEPENDENT_AMBULATORY_CARE_PROVIDER_SITE_OTHER): Payer: No Typology Code available for payment source | Admitting: Rehabilitative and Restorative Service Providers"

## 2022-03-19 ENCOUNTER — Other Ambulatory Visit: Payer: Self-pay

## 2022-03-19 DIAGNOSIS — M25661 Stiffness of right knee, not elsewhere classified: Secondary | ICD-10-CM | POA: Diagnosis not present

## 2022-03-19 DIAGNOSIS — R262 Difficulty in walking, not elsewhere classified: Secondary | ICD-10-CM | POA: Diagnosis not present

## 2022-03-19 DIAGNOSIS — R600 Localized edema: Secondary | ICD-10-CM

## 2022-03-19 DIAGNOSIS — M25561 Pain in right knee: Secondary | ICD-10-CM

## 2022-03-19 DIAGNOSIS — M6281 Muscle weakness (generalized): Secondary | ICD-10-CM | POA: Diagnosis not present

## 2022-03-19 NOTE — Therapy (Signed)
?OUTPATIENT PHYSICAL THERAPY TREATMENT NOTE  ? ? ?Patient Name: Leroy Griffith ?MRN: [id number] ?DOB:Feb 07, 1986, 36 y.o., male ?Today's Date: 03/19/2022 ? ?PCP: Pcp, No ?REFERRING PROVIDER: Cammy Copa, MD ? ?Progress Note ?Reporting Period 01/16/2022 to 03/14/2022 ? ?See note below for Objective Data and Assessment of Progress/Goals.  ? ? ? PT End of Session - 03/19/22 7939   ? ? Visit Number 16   ? Number of Visits 32   ? Date for PT Re-Evaluation 05/23/22   ? Authorization Type Workers Compensation   ? Authorization Time Period until 5/10   ? Authorization - Visit Number 1   ? Authorization - Number of Visits 10   ? Progress Note Due on Visit 10   ? PT Start Time 0840   ? PT Stop Time 0925   ? PT Time Calculation (min) 45 min   ? Activity Tolerance Patient tolerated treatment well   ? Behavior During Therapy [facility] for tasks assessed/performed   ? ?  ?  ? ?  ? ? ? ? ? ? ?Past Medical History:  ?Diagnosis Date  ? GERD (gastroesophageal reflux disease)   ? Sleep apnea   ? ?Past Surgical History:  ?Procedure Laterality Date  ? ANTERIOR CRUCIATE LIGAMENT REPAIR Right 01/07/2022  ? Procedure: RIGHT KNEE ANTERIOR CRUCIATE LIGAMENT RECONSTRUCTION QUAD AUTOGRAFT, PARTIAL MEDIAL MENISCECTOMY;  Surgeon: Cammy Copa, MD;  Location: [facility] OR;  Service: Orthopedics;  Laterality: Right;  ? ?Patient Active Problem List  ? Diagnosis Date Noted  ? Rupture of anterior cruciate ligament of right knee   ? Acute medial meniscus tear of right knee   ? ? ?REFERRING PROVIDER: Cammy Copa, MD ?  ?REFERRING DIAG: Q30.092Z (ICD-10-CM) - Rupture of anterior cruciate ligament of right knee, initial encounter ? ?THERAPY DIAG:  ?Acute pain of right knee ? ?Stiffness of right knee, not elsewhere classified ? ?Muscle weakness (generalized) ? ?Difficulty in walking, not elsewhere classified ? ?Localized edema ? ?PERTINENT HISTORY: unremarkable ? ?PRECAUTIONS: 6 weeks plus out. No OKC loaded ?  ?WEIGHT BEARING RESTRICTIONS None at this  time.  ? ?SUBJECTIVE:  Pt stated no pain complaints today.  Indicated he was able to walk the zoo and mow without complaints afterward.   Pt rated recovery to 75% at this time.  ? ? ?PAIN:  ?Are you having pain? No pain at rest ?NPRS scale:0/10 ?Pain location: Rt knee  ?Pain orientation: Right  ?PAIN TYPE: surgery  ?Pain description:  ?Aggravating factors:  ?Relieving factors: ? ? ?OBJECTIVE:  ?  ?  ?  ?PATIENT SURVEYS:  ? 03/14/2022:  FOTO update:  67  ?01/16/2022 FOTO intake:  43 predicted:  75 ?  ?COGNITION: ?         01/16/2022: Overall cognitive status: Within functional limits for tasks assessed                  ?          ?SENSATION: ?         01/16/2022: Light touch: Appears intact ?  ?Edema/Atrophy: ?         01/16/2022: jt measurement Lt: 14.5 inch   Rt: 16.5 inch ?                         Quad 6 inches proximal to superior patellar border Lt: 21 inch    Rt: 20.5 inch ?  ?  ?PALPATION: ?01/16/2022: Mild tenderness around incisions, distal quad insertion ?  ?  LE AROM/PROM: ?  ?A/PROM Right ?01/16/2022 Left ?01/16/2022 Right ?01/28/2022 Right ?02/04/2022 Right ?02/18/2022 Right ?03/14/2022  ?Hip flexion          ?Hip extension          ?Hip abduction          ?Hip adduction          ?Hip internal rotation          ?Hip external rotation          ?Knee flexion AROM in supine heel slide: 90 ?  ?PROM in supine heel slide: 93 AROM in supine heel slide: 146 AROM in supine heel slide: 114 degrees AROM in supine ?Heel slide: 115 degrees AROM in supine heel slide 130 degrees AROM in supine heel slide 135   ?Knee extension AROM in LAQ: -50 c pain noted ?PROM in supine heel prop -2 ?  AROM in LAQ:  2 degrees hyperextension ?  AROM in sitting LAQ: -5 degrees AROM in supine ?-5 degrees  AROM 0 deg  ?Ankle dorsiflexion          ?Ankle plantarflexion          ?Ankle inversion          ?Ankle eversion          ? (Blank rows = not tested) ?  ?LE MMT: ?  ?MMT ?  Right ?01/16/2022 Left ?01/16/2022 Right ?01/28/2022 Right ?02/21/2022 Left ?02/21/2022  Right ?03/07/2022 Right ?03/14/2022  ?Hip flexion 4/5 5/5       ?Hip extension           ?Hip abduction           ?Hip adduction           ?Hip internal rotation           ?Hip external rotation           ?Knee flexion 2/5 5/5       ?Knee extension 1+ 5/5 3/5 (no resistance testing today) 4/5 ?36, 33 lbs 5/5 ?91.9, 93.5 lbs  5/5 ?46, 41 lbs 5/5 ?57, 55 lbs  ?Ankle dorsiflexion 5/5 5/5       ?Ankle plantarflexion           ?Ankle inversion 5/5 5/5       ?Ankle eversion 5/5 5/5       ? (Blank rows = not tested) ?  ?  ?  ?FUNCTIONAL TESTS:  ?03/14/2022:  Rt SLS 30 seconds even ground ? ?02/11/2022:  Rt SLS: 25 seconds ? ?01/28/2022: able to perform SLR x 15 c good control( Requirement noted from MD note for ambulation without immobilizer ? ?01/16/2022:  Rt SLS: unable ?  ?GAIT: ?01/28/2022: Ambulation into clinic without immobilizer, decreased stance on Lt, lacking full TKE in terminal swing ? ?01/20/2022: Immobilizer Rt knee c no crutches noted ? ? ?  ?Today's Treatment:  ?03/19/2022:  ? ?BFR   : 196 mmHg, exercise 150 mmHg LOP, cuff size 4 Rt leg ? ?Therex:       Recumbent bike lvl 5 5 mins ?  Reverse lunge slider 2 x 15 bilateral ?  Fwd step up c TKE blue band 2 sec hold 3 x 15 8 inch step ?  Wall sit to fatigue (Lt leg slide anterior placement compared to Rt) in 50-60 deg flexion - 5 mins ?  Seated SLR c BFR 150 mmHg x30, 3x15 ? ?Neuro re-ed:   ?  SLS on foam c anterior, anterior/medial, anterior/lateral  cone touches x 10 bilateral ?  SLS balance c 2 tennis ball on foam tossing 20x 3 bilaterally performed ?   ? ?03/14/2022:  ? ?BFR   : 196 mmHg, exercise 150 mmHg LOP, cuff size 4 Rt leg ? ?Therex:       Recumbent bike lvl 5 5 mins ?  Reverse lunge slider 2 x 10 bilateral ?  Lateral stepping reach c green band around ankles 3 lateral cones x 10 bilateral ?  Leg press to 90 degrees knee flexion - Rt LE only 87 lbs (BFR 150mmHg: x 30, 3x15) (increase weight next visit) ? ?Neuro re-ed:   ?  SLS fitter board rocker fwd/back 20x each  bilateral ?  SLS balance on fitter rocker board 1 min attempt x 1 bilateral (occasional HHA) ?  SLS on airex foam c 10 lb kettle bell pass side to side 2 x 20 bilateral ? ?03/11/2022:  ? ?BFR   : 196 mmHg, exercise 150 mmHg LOP, cuff size 4 Rt leg ? ?Therex:       Recumbent bike 2 min warm up lvl 5, 6 mins of 30 sec on /off interval at 100 rpm fast/75 rpm off time), 2 mins cool down lvl 2 (10 mins total) ?  Spanish squat c strap around knees BFR 150 mmHg 30, 3 x 15 ?   ? ?TherActivity -Performed to improve functional control in stair and squat movements.  ?Leg press to 90 degrees knee flexion - Rt LE only 81 lbs (BFR 150mmHg: x 30, 3x15) (increase weight next visit) ?4 inch step on/over/down on Rt leg slow controlled movement  ?   ?   ?Neuro re-ed:   ?  SLS y balance reaches c contralateral leg light touch x 10 bilateral ?  SLS c kettle bell lateral pass off hand to hand 10 lbs 30 x 2 bilateral ? ? ?03/07/2022:  ? ?BFR   : 196 mmHg, exercise 150 mmHg LOP, cuff size 4 Rt leg ? ?Therex:       Recumbent bike 2 min warm up lvl 5, 6 mins of 30 sec on /off interval at 100 rpm fast/75 rpm off time), 2 mins cool down lvl 2 (10 mins total0 ?  Single leg dead lift  x 20 bilateral to 6 inch cone, BFR 150 mmHg on Rt ?  Seated SLR 2 x 15 c BFR 150 mmHg Rt ? ?TherActivity -Performed to improve functional control in stair and squat movements.  ?Leg press to 90 degrees knee flexion - Rt LE only 75 lbs (BFR 150mmHg: x 30, 3x15) (increase weight next visit) ?Lateral step down eccentric lowering on Rt leg 2 x 15 ?   ?   ?Neuro re-ed:   ?  SLS c contralateral slider fwd, lateral, reverse x 10 bilateral ?  Fitter board fwd/back double leg 20x light touching, SL x 20 bilateral ? ?Vasopneumatic ?Rt knee 10 mins medium compression 34 deg ? ? ?PATIENT EDUCATION:  ?01/16/2022:  ?Education details: HEP, POC, precautions ?Person educated: Patient ?Education method: Explanation, Demonstration, Verbal cues, and Handouts ?Education comprehension:  verbalized understanding and returned demonstration ?  ?  ?HOME EXERCISE PROGRAM: ?Access Code: [email or web address] ? ?  ?  ?ASSESSMENT: ?  ?CLINICAL IMPRESSION: ?Continued progress in WB strengthening control c Rt leg

## 2022-03-25 ENCOUNTER — Ambulatory Visit (INDEPENDENT_AMBULATORY_CARE_PROVIDER_SITE_OTHER): Payer: No Typology Code available for payment source | Admitting: Physical Therapy

## 2022-03-25 ENCOUNTER — Encounter: Payer: Self-pay | Admitting: Physical Therapy

## 2022-03-25 DIAGNOSIS — R262 Difficulty in walking, not elsewhere classified: Secondary | ICD-10-CM | POA: Diagnosis not present

## 2022-03-25 DIAGNOSIS — R600 Localized edema: Secondary | ICD-10-CM

## 2022-03-25 DIAGNOSIS — M25561 Pain in right knee: Secondary | ICD-10-CM | POA: Diagnosis not present

## 2022-03-25 DIAGNOSIS — M25661 Stiffness of right knee, not elsewhere classified: Secondary | ICD-10-CM

## 2022-03-25 DIAGNOSIS — M6281 Muscle weakness (generalized): Secondary | ICD-10-CM | POA: Diagnosis not present

## 2022-03-25 NOTE — Therapy (Signed)
?OUTPATIENT PHYSICAL THERAPY TREATMENT NOTE  ? ? ?Patient Name: Leroy Griffith ?MRN: [id number] ?DOB:1986/05/02, 36 y.o., male ?Today's Date: 03/25/2022 ? ?PCP: Pcp, No ?REFERRING PROVIDER: Meredith Pel, MD ? ?Progress Note ?Reporting Period 01/16/2022 to 03/14/2022 ? ?See note below for Objective Data and Assessment of Progress/Goals.  ? ? ? PT End of Session - 03/25/22 1038   ? ? Visit Number 17   ? Number of Visits 32   ? Date for PT Re-Evaluation 05/23/22   ? Authorization Type Workers Compensation   ? Authorization Time Period until 5/10   ? Authorization - Visit Number 2   ? Authorization - Number of Visits 10   ? Progress Note Due on Visit 10   ? PT Start Time 1020   ? PT Stop Time 1100   ? PT Time Calculation (min) 40 min   ? Activity Tolerance Patient tolerated treatment well   ? Behavior During Therapy [facility] for tasks assessed/performed   ? ?  ?  ? ?  ? ? ? ? ? ? ? ?Past Medical History:  ?Diagnosis Date  ? GERD (gastroesophageal reflux disease)   ? Sleep apnea   ? ?Past Surgical History:  ?Procedure Laterality Date  ? ANTERIOR CRUCIATE LIGAMENT REPAIR Right 01/07/2022  ? Procedure: RIGHT KNEE ANTERIOR CRUCIATE LIGAMENT RECONSTRUCTION QUAD AUTOGRAFT, PARTIAL MEDIAL MENISCECTOMY;  Surgeon: Meredith Pel, MD;  Location: [place];  Service: Orthopedics;  Laterality: Right;  ? ?Patient Active Problem List  ? Diagnosis Date Noted  ? Rupture of anterior cruciate ligament of right knee   ? Acute medial meniscus tear of right knee   ? ? ?REFERRING PROVIDER: Meredith Pel, MD ?  ?REFERRING DIAG: [id number] (ICD-10-CM) - Rupture of anterior cruciate ligament of right knee, initial encounter ? ?THERAPY DIAG:  ?Acute pain of right knee ? ?Stiffness of right knee, not elsewhere classified ? ?Muscle weakness (generalized) ? ?Difficulty in walking, not elsewhere classified ? ?Localized edema ? ?PERTINENT HISTORY: unremarkable ? ?PRECAUTIONS: 6 weeks plus out. No OKC loaded ?  ?WEIGHT BEARING RESTRICTIONS None at this  time.  ? ?SUBJECTIVE: Pt stating he mowed his neighbors yard without his support sleeve at the end of last week with no pain. Pt stated he was more cautious. Pt still stating going up the stairs can be mildly painful.  ? ? ?PAIN:  ?Are you having pain? No pain at rest ?NPRS scale:0/10 ?Pain location: Rt knee  ?Pain orientation: Right  ?PAIN TYPE: surgery  ?Pain description:  ?Aggravating factors:  ?Relieving factors: ? ? ?OBJECTIVE:  ?  ?  ?  ?PATIENT SURVEYS:  ? 03/14/2022:  FOTO update:  67  ?01/16/2022 FOTO intake:  43 predicted:  75 ?  ?COGNITION: ?         01/16/2022: Overall cognitive status: Within functional limits for tasks assessed                  ?          ?SENSATION: ?         01/16/2022: Light touch: Appears intact ?  ?Edema/Atrophy: ?         01/16/2022: jt measurement Lt: 14.5 inch   Rt: 16.5 inch ?                         Quad 6 inches proximal to superior patellar border Lt: 21 inch    Rt: 20.5 inch ?  ?  ?PALPATION: ?01/16/2022:  Mild tenderness around incisions, distal quad insertion ?  ?LE AROM/PROM: ?  ?A/PROM Right ?01/16/2022 Left ?01/16/2022 Right ?01/28/2022 Right ?02/04/2022 Right ?02/18/2022 Right ?03/14/2022 Rt ?03/25/2022  ?Hip flexion           ?Hip extension           ?Hip abduction           ?Hip adduction           ?Hip internal rotation           ?Hip external rotation           ?Knee flexion AROM in supine heel slide: 90 ?  ?PROM in supine heel slide: 93 AROM in supine heel slide: 146 AROM in supine heel slide: 114 degrees AROM in supine ?Heel slide: 115 degrees AROM in supine heel slide 130 degrees AROM in supine heel slide 135  AROM  ?Supine heel slide 136 degrees ?  ?Knee extension AROM in LAQ: -50 c pain noted ?PROM in supine heel prop -2 ?  AROM in LAQ:  2 degrees hyperextension ?  AROM in sitting LAQ: -5 degrees AROM in supine ?-5 degrees  AROM 0 deg AROM ?0 degrees  ?Ankle dorsiflexion           ?Ankle plantarflexion           ?Ankle inversion           ?Ankle eversion           ? (Blank rows =  not tested) ?  ?LE MMT: ?  ?MMT ?  Right ?01/16/2022 Left ?01/16/2022 Right ?01/28/2022 Right ?02/21/2022 Left ?02/21/2022 Right ?03/07/2022 Right ?03/14/2022  ?Hip flexion 4/5 5/5       ?Hip extension           ?Hip abduction           ?Hip adduction           ?Hip internal rotation           ?Hip external rotation           ?Knee flexion 2/5 5/5       ?Knee extension 1+ 5/5 3/5 (no resistance testing today) 4/5 ?36, 33 lbs 5/5 ?91.9, 93.5 lbs  5/5 ?46, 41 lbs 5/5 ?57, 55 lbs  ?Ankle dorsiflexion 5/5 5/5       ?Ankle plantarflexion           ?Ankle inversion 5/5 5/5       ?Ankle eversion 5/5 5/5       ? (Blank rows = not tested) ?  ?  ?  ?FUNCTIONAL TESTS:  ?03/14/2022:  Rt SLS 30 seconds even ground ? ?02/11/2022:  Rt SLS: 25 seconds ? ?01/28/2022: able to perform SLR x 15 c good control( Requirement noted from MD note for ambulation without immobilizer ? ?01/16/2022:  Rt SLS: unable ?  ?GAIT: ?01/28/2022: Ambulation into clinic without immobilizer, decreased stance on Lt, lacking full TKE in terminal swing ? ?01/20/2022: Immobilizer Rt knee c no crutches noted ? ? ?  ?Today's Treatment:  ?03/25/2022:  ? ?BFR   : 196 mmHg, exercise 150 mmHg LOP, cuff size 4 Rt leg ? ?Therex:       Recumbent bike lvl 5 5 mins ?  Reverse lunge slider 2 x 15 bilateral ?  Squats on BOSU ball with BFR limited to 50-60 flexion 30, 3 x 15 ?  Leg Press: 93# Rt LE only BFR  x30, 3 x 15 ?  Seated SLR c BFR 150 mmHg x30, 3x15 ? ?Neuro re-ed:   ?  SLS on foam tapping 3 cones placed across front of Airex mat, and 3 placed in front of mat each with 10 taps ? ?   ? ?03/19/2022:  ? ?BFR   : 196 mmHg, exercise 150 mmHg LOP, cuff size 4 Rt leg ? ?Therex:       Recumbent bike lvl 5 5 mins ?  Reverse lunge slider 2 x 15 bilateral ?  Fwd step up c TKE blue band 2 sec hold 3 x 15 8 inch step ?  Wall sit to fatigue (Lt leg slide anterior placement compared to Rt) in 50-60 deg flexion - 5 mins ?  Seated SLR c BFR 150 mmHg x30, 3x15 ? ?Neuro re-ed:   ?  SLS on foam c  anterior, anterior/medial, anterior/lateral cone touches x 10 bilateral ?  SLS balance c 2 tennis ball on foam tossing 20x 3 bilaterally performed ?   ? ?03/14/2022:  ? ?BFR   : 196 mmHg, exercise 150 mmHg LOP, cuff size 4 Rt leg ? ?Therex:       Recumbent bike lvl 5 5 mins ?  Reverse lunge slider 2 x 10 bilateral ?  Lateral stepping reach c green band around ankles 3 lateral cones x 10 bilateral ?  Leg press to 90 degrees knee flexion - Rt LE only 87 lbs (BFR 122mmHg: x 30, 3x15) (increase weight next visit) ? ?Neuro re-ed:   ?  SLS fitter board rocker fwd/back 20x each bilateral ?  SLS balance on fitter rocker board 1 min attempt x 1 bilateral (occasional HHA) ?  SLS on airex foam c 10 lb kettle bell pass side to side 2 x 20 bilateral ? ?03/11/2022:  ? ?BFR   : 196 mmHg, exercise 150 mmHg LOP, cuff size 4 Rt leg ? ?Therex:       Recumbent bike 2 min warm up lvl 5, 6 mins of 30 sec on /off interval at 100 rpm fast/75 rpm off time), 2 mins cool down lvl 2 (10 mins total) ?  Spanish squat c strap around knees BFR 150 mmHg 30, 3 x 15 ?   ? ?TherActivity -Performed to improve functional control in stair and squat movements.  ?Leg press to 90 degrees knee flexion - Rt LE only 81 lbs (BFR 123mmHg: x 30, 3x15) (increase weight next visit) ?4 inch step on/over/down on Rt leg slow controlled movement  ?   ?   ?Neuro re-ed:   ?  SLS y balance reaches c contralateral leg light touch x 10 bilateral ?  SLS c kettle bell lateral pass off hand to hand 10 lbs 30 x 2 bilateral ? ? ? ? ? ?PATIENT EDUCATION:  ?01/16/2022:  ?Education details: HEP, POC, precautions ?Person educated: Patient ?Education method: Explanation, Demonstration, Verbal cues, and Handouts ?Education comprehension: verbalized understanding and returned demonstration ?  ?  ?HOME EXERCISE PROGRAM: ?Access Code: [email or web address] ? ?  ?  ?ASSESSMENT: ?  ?CLINICAL IMPRESSION: ?Pt continuing with quad strengthening and control with functional mobility. Pt with obvious muscle  fatigue during session. Continue with skilled PT toward LTG's.  ?  ?REHAB POTENTIAL: Good ?  ?CLINICAL DECISION MAKING: Stable/uncomplicated ?  ?EVALUATION COMPLEXITY: Low ?  ?  ?GOALS: ?Goals reviewed with pat

## 2022-04-01 ENCOUNTER — Ambulatory Visit (INDEPENDENT_AMBULATORY_CARE_PROVIDER_SITE_OTHER): Payer: PRIVATE HEALTH INSURANCE | Admitting: Physical Therapy

## 2022-04-01 ENCOUNTER — Encounter: Payer: Self-pay | Admitting: Physical Therapy

## 2022-04-01 DIAGNOSIS — M6281 Muscle weakness (generalized): Secondary | ICD-10-CM

## 2022-04-01 DIAGNOSIS — M25661 Stiffness of right knee, not elsewhere classified: Secondary | ICD-10-CM

## 2022-04-01 DIAGNOSIS — M25561 Pain in right knee: Secondary | ICD-10-CM

## 2022-04-01 DIAGNOSIS — R262 Difficulty in walking, not elsewhere classified: Secondary | ICD-10-CM | POA: Diagnosis not present

## 2022-04-01 DIAGNOSIS — R600 Localized edema: Secondary | ICD-10-CM

## 2022-04-01 NOTE — Therapy (Signed)
?OUTPATIENT PHYSICAL THERAPY TREATMENT NOTE  ? ? ?Patient Name: Leroy Griffith ?MRN: [id number] ?DOB:11-16-1986, 36 y.o., male ?Today's Date: 04/01/2022 ? ?PCP: Pcp, No ?REFERRING PROVIDER: Meredith Pel, MD ? ?Progress Note ?Reporting Period 01/16/2022 to 03/14/2022 ? ?See note below for Objective Data and Assessment of Progress/Goals.  ? ? ? ? ? ? ? ? ? ? ?Past Medical History:  ?Diagnosis Date  ? GERD (gastroesophageal reflux disease)   ? Sleep apnea   ? ?Past Surgical History:  ?Procedure Laterality Date  ? ANTERIOR CRUCIATE LIGAMENT REPAIR Right 01/07/2022  ? Procedure: RIGHT KNEE ANTERIOR CRUCIATE LIGAMENT RECONSTRUCTION QUAD AUTOGRAFT, PARTIAL MEDIAL MENISCECTOMY;  Surgeon: Meredith Pel, MD;  Location: [place];  Service: Orthopedics;  Laterality: Right;  ? ?Patient Active Problem List  ? Diagnosis Date Noted  ? Rupture of anterior cruciate ligament of right knee   ? Acute medial meniscus tear of right knee   ? ? ?REFERRING PROVIDER: Meredith Pel, MD ?  ?REFERRING DIAG: M22.633H (ICD-10-CM) - Rupture of anterior cruciate ligament of right knee, initial encounter ? ?THERAPY DIAG:  ?No diagnosis found. ? ?PERTINENT HISTORY: unremarkable ? ?PRECAUTIONS: 6 weeks plus out. No OKC loaded ?  ?WEIGHT BEARING RESTRICTIONS None at this time.  ? ?SUBJECTIVE: Pt stating he is no longer using over the counter pain meds at night.  ? ? ?PAIN:  ?Are you having pain? No pain at rest ?NPRS scale:0/10 ?Pain location: Rt knee  ?Pain orientation: Right  ?PAIN TYPE: surgery  ?Pain description:  ?Aggravating factors:  ?Relieving factors: ? ? ?OBJECTIVE:  ?  ?  ?  ?PATIENT SURVEYS:  ? 03/14/2022:  FOTO update:  67  ?01/16/2022 FOTO intake:  43 (predicted:  75) ?  ?COGNITION: ?         01/16/2022: Overall cognitive status: Within functional limits for tasks assessed                  ?          ?SENSATION: ?         01/16/2022: Light touch: Appears intact ?  ?Edema/Atrophy: ?         01/16/2022: jt measurement Lt: 14.5 inch   Rt: 16.5  inch ?                         Quad 6 inches proximal to superior patellar border Lt: 21 inch    Rt: 20.5 inch ?  ?  ?PALPATION: ?01/16/2022: Mild tenderness around incisions, distal quad insertion ?  ?LE AROM/PROM: ?  ?A/PROM Right ?01/16/2022 Left ?01/16/2022 Right ?01/28/2022 Right ?02/04/2022 Right ?02/18/2022 Right ?03/14/2022 Rt ?03/25/2022  ?Hip flexion           ?Hip extension           ?Hip abduction           ?Hip adduction           ?Hip internal rotation           ?Hip external rotation           ?Knee flexion AROM in supine heel slide: 90 ?  ?PROM in supine heel slide: 93 AROM in supine heel slide: 146 AROM in supine heel slide: 114 degrees AROM in supine ?Heel slide: 115 degrees AROM in supine heel slide 130 degrees AROM in supine heel slide 135  AROM  ?Supine heel slide 136 degrees ?  ?Knee extension AROM in LAQ: -50 c  pain noted ?PROM in supine heel prop -2 ?  AROM in LAQ:  2 degrees hyperextension ?  AROM in sitting LAQ: -5 degrees AROM in supine ?-5 degrees  AROM 0 deg AROM ?0 degrees  ?Ankle dorsiflexion           ?Ankle plantarflexion           ?Ankle inversion           ?Ankle eversion           ? (Blank rows = not tested) ?  ?LE MMT: ?  ?MMT ?  Right ?01/16/2022 Left ?01/16/2022 Right ?01/28/2022 Right ?02/21/2022 Left ?02/21/2022 Right ?03/07/2022 Right ?03/14/2022  ?Hip flexion 4/5 5/5       ?Hip extension           ?Hip abduction           ?Hip adduction           ?Hip internal rotation           ?Hip external rotation           ?Knee flexion 2/5 5/5       ?Knee extension 1+ 5/5 3/5 (no resistance testing today) 4/5 ?36, 33 lbs 5/5 ?91.9, 93.5 lbs  5/5 ?46, 41 lbs 5/5 ?57, 55 lbs  ?Ankle dorsiflexion 5/5 5/5       ?Ankle plantarflexion           ?Ankle inversion 5/5 5/5       ?Ankle eversion 5/5 5/5       ? (Blank rows = not tested) ?  ?  ?  ?FUNCTIONAL TESTS:  ?03/14/2022:  Rt SLS 30 seconds even ground ? ?02/11/2022:  Rt SLS: 25 seconds ? ?01/28/2022: able to perform SLR x 15 c good control( Requirement noted  from MD note for ambulation without immobilizer ? ?01/16/2022:  Rt SLS: unable ?  ?GAIT: ?01/28/2022: Ambulation into clinic without immobilizer, decreased stance on Lt, lacking full TKE in terminal swing ? ?01/20/2022: Immobilizer Rt knee c no crutches noted ? ? ?  ?Today's Treatment:  ?04/01/2022: ? ?BFR   : 196 mmHg, exercise 150 mmHg LOP, cuff size 4 Rt leg ? ?Therex:       Recumbent bike lvl 5 6 mins with BFR ?  Squats on BOSU ball with BFR limited to 50-60 flexion 30, 3 x 15 ?  Leg Press: 100# Rt LE only BFR  x 30, 3 x 15 ?  Single leg bridge on BOSU ball x 20, 2 x 15 with BFR decreased to 120 mmHG ?   ?   ? ?Neuro re-ed:   ? SLS: taps off 6 inch step with BFR x 30, 3 x 15, with Level 4 theraband around Rt knee ? Rt SLS on BOSU ball 30 seconds x 3  ? Rt SLS on BOSU ball with cone taps lateral with 3 cones ? ? ?03/25/2022:  ? ?BFR   : 196 mmHg, exercise 150 mmHg LOP, cuff size 4 Rt leg ? ?Therex:       Recumbent bike lvl 5 5 mins ?  Reverse lunge slider 2 x 15 bilateral ?  Squats on BOSU ball with BFR limited to 50-60 flexion 30, 3 x 15 ?  Leg Press: 93# Rt LE only BFR  x30, 3 x 15 ?  Seated SLR c BFR 150 mmHg x30, 3x15 ? ?Neuro re-ed:   ?  SLS on foam tapping 3 cones placed across front of Airex mat,  and 3 placed in front of mat each with 10 taps ? ?   ? ?03/19/2022:  ? ?BFR   : 196 mmHg, exercise 150 mmHg LOP, cuff size 4 Rt leg ? ?Therex:       Recumbent bike lvl 5 5 mins ?  Reverse lunge slider 2 x 15 bilateral ?  Fwd step up c TKE blue band 2 sec hold 3 x 15 8 inch step ?  Wall sit to fatigue (Lt leg slide anterior placement compared to Rt) in 50-60 deg flexion - 5 mins ?  Seated SLR c BFR 150 mmHg x30, 3x15 ? ?Neuro re-ed:   ?  SLS on foam c anterior, anterior/medial, anterior/lateral cone touches x 10 bilateral ?  SLS balance c 2 tennis ball on foam tossing 20x 3 bilaterally performed ?   ? ?03/14/2022:  ? ?BFR   : 196 mmHg, exercise 150 mmHg LOP, cuff size 4 Rt leg ? ?Therex:       Recumbent bike lvl 5 5  mins ?  Reverse lunge slider 2 x 10 bilateral ?  Lateral stepping reach c green band around ankles 3 lateral cones x 10 bilateral ?  Leg press to 90 degrees knee flexion - Rt LE only 87 lbs (BFR 113mHg: x 30, 3x15) (increase weight next visit) ? ?Neuro re-ed:   ?  SLS fitter board rocker fwd/back 20x each bilateral ?  SLS balance on fitter rocker board 1 min attempt x 1 bilateral (occasional HHA) ?  SLS on airex foam c 10 lb kettle bell pass side to side 2 x 20 bilateral ? ?03/11/2022:  ? ?BFR   : 196 mmHg, exercise 150 mmHg LOP, cuff size 4 Rt leg ? ?Therex:       Recumbent bike 2 min warm up lvl 5, 6 mins of 30 sec on /off interval at 100 rpm fast/75 rpm off time), 2 mins cool down lvl 2 (10 mins total) ?  Spanish squat c strap around knees BFR 150 mmHg 30, 3 x 15 ?   ? ?TherActivity -Performed to improve functional control in stair and squat movements.  ?Leg press to 90 degrees knee flexion - Rt LE only 81 lbs (BFR 1552mg: x 30, 3x15) (increase weight next visit) ?4 inch step on/over/down on Rt leg slow controlled movement  ?   ?   ?Neuro re-ed:   ?  SLS y balance reaches c contralateral leg light touch x 10 bilateral ?  SLS c kettle bell lateral pass off hand to hand 10 lbs 30 x 2 bilateral ? ? ? ? ? ?PATIENT EDUCATION:  ?01/16/2022:  ?Education details: HEP, POC, precautions ?Person educated: Patient ?Education method: Explanation, Demonstration, Verbal cues, and Handouts ?Education comprehension: verbalized understanding and returned demonstration ?  ?  ?HOME EXERCISE PROGRAM: ?Access Code: [email or web address] ? ?  ?  ?ASSESSMENT: ?  ?CLINICAL IMPRESSION: ?Pt continuing with quad and hamstring strengthening focusing on control with functional mobility. Pt continuing with obvious muscle fatigue during session especially when using BFR.  Continue with skilled PT toward LTG's.  ?  ?REHAB POTENTIAL: Good ?  ?CLINICAL DECISION MAKING: Stable/uncomplicated ?  ?EVALUATION COMPLEXITY: Low ?  ?  ?GOALS: ?Goals reviewed with  patient? Yes ?  ?SHORT TERM GOALS: ? ?STG Name Target Date Goal status  ?1 Patient will demonstrate independent use of home exercise program to maintain progress from in clinic treatments.   02/06/2022 Met  ?2 Patient

## 2022-04-03 ENCOUNTER — Ambulatory Visit (INDEPENDENT_AMBULATORY_CARE_PROVIDER_SITE_OTHER): Payer: PRIVATE HEALTH INSURANCE | Admitting: Rehabilitative and Restorative Service Providers"

## 2022-04-03 ENCOUNTER — Other Ambulatory Visit: Payer: Self-pay

## 2022-04-03 ENCOUNTER — Encounter: Payer: Self-pay | Admitting: Rehabilitative and Restorative Service Providers"

## 2022-04-03 DIAGNOSIS — R262 Difficulty in walking, not elsewhere classified: Secondary | ICD-10-CM | POA: Diagnosis not present

## 2022-04-03 DIAGNOSIS — M25561 Pain in right knee: Secondary | ICD-10-CM | POA: Diagnosis not present

## 2022-04-03 DIAGNOSIS — R600 Localized edema: Secondary | ICD-10-CM

## 2022-04-03 DIAGNOSIS — M6281 Muscle weakness (generalized): Secondary | ICD-10-CM | POA: Diagnosis not present

## 2022-04-03 DIAGNOSIS — M25661 Stiffness of right knee, not elsewhere classified: Secondary | ICD-10-CM | POA: Diagnosis not present

## 2022-04-03 NOTE — Therapy (Signed)
?OUTPATIENT PHYSICAL THERAPY TREATMENT NOTE  ? ? ?Patient Name: Leroy Griffith ?MRN: [id number] ?DOB:10-13-1986, 36 y.o., male ?Today's Date: 04/03/2022 ? ?PCP: Pcp, No ?REFERRING PROVIDER: Meredith Pel, MD ? ? ? PT End of Session - 04/03/22 5784   ? ? Visit Number 19   ? Number of Visits 32   ? Date for PT Re-Evaluation 05/23/22   ? Authorization Type Workers Compensation   ? Authorization - Visit Number 4   ? Authorization - Number of Visits 10   ? Progress Note Due on Visit 20   ? PT Start Time [id number]   ? PT Stop Time 0837   ? PT Time Calculation (min) 39 min   ? Activity Tolerance Patient tolerated treatment well   ? Behavior During Therapy [facility] for tasks assessed/performed   ? ?  ?  ? ?  ? ? ? ? ?Past Medical History:  ?Diagnosis Date  ? GERD (gastroesophageal reflux disease)   ? Sleep apnea   ? ?Past Surgical History:  ?Procedure Laterality Date  ? ANTERIOR CRUCIATE LIGAMENT REPAIR Right 01/07/2022  ? Procedure: RIGHT KNEE ANTERIOR CRUCIATE LIGAMENT RECONSTRUCTION QUAD AUTOGRAFT, PARTIAL MEDIAL MENISCECTOMY;  Surgeon: Meredith Pel, MD;  Location: [place];  Service: Orthopedics;  Laterality: Right;  ? ?Patient Active Problem List  ? Diagnosis Date Noted  ? Rupture of anterior cruciate ligament of right knee   ? Acute medial meniscus tear of right knee   ? ? ?REFERRING PROVIDER: Meredith Pel, MD ?  ?REFERRING DIAG: X52.841L (ICD-10-CM) - Rupture of anterior cruciate ligament of right knee, initial encounter ? ?THERAPY DIAG:  ?Acute pain of right knee ? ?Stiffness of right knee, not elsewhere classified ? ?Difficulty in walking, not elsewhere classified ? ?Muscle weakness (generalized) ? ?Localized edema ? ?PERTINENT HISTORY: unremarkable ? ?PRECAUTIONS: No OKC loaded ?  ?WEIGHT BEARING RESTRICTIONS None at this time.  ? ?SUBJECTIVE: No complaints to report.  ? ? ?PAIN:  ?Are you having pain? No pain at rest ?NPRS scale:0/10 ?Pain location: Rt knee  ?Pain orientation: Right  ?PAIN TYPE: surgery  ?Pain  description:  ?Aggravating factors:  ?Relieving factors: ? ? ?OBJECTIVE:  ?  ?  ?  ?PATIENT SURVEYS:  ? 03/14/2022:  FOTO update:  67  ?01/16/2022 FOTO intake:  43 (predicted:  75) ?  ?COGNITION: ?         01/16/2022: Overall cognitive status: Within functional limits for tasks assessed                  ?          ?SENSATION: ?         01/16/2022: Light touch: Appears intact ?  ?Edema/Atrophy: ?         01/16/2022: jt measurement Lt: 14.5 inch   Rt: 16.5 inch ?                         Quad 6 inches proximal to superior patellar border Lt: 21 inch    Rt: 20.5 inch ?  ?  ?PALPATION: ?01/16/2022: Mild tenderness around incisions, distal quad insertion ?  ?LE AROM/PROM: ?  ?A/PROM Right ?01/16/2022 Left ?01/16/2022 Right ?01/28/2022 Right ?02/04/2022 Right ?02/18/2022 Right ?03/14/2022 Rt ?03/25/2022  ?Hip flexion           ?Hip extension           ?Hip abduction           ?Hip adduction           ?  Hip internal rotation           ?Hip external rotation           ?Knee flexion AROM in supine heel slide: 90 ?  ?PROM in supine heel slide: 93 AROM in supine heel slide: 146 AROM in supine heel slide: 114 degrees AROM in supine ?Heel slide: 115 degrees AROM in supine heel slide 130 degrees AROM in supine heel slide 135  AROM  ?Supine heel slide 136 degrees ?  ?Knee extension AROM in LAQ: -50 c pain noted ?PROM in supine heel prop -2 ?  AROM in LAQ:  2 degrees hyperextension ?  AROM in sitting LAQ: -5 degrees AROM in supine ?-5 degrees  AROM 0 deg AROM ?0 degrees  ?Ankle dorsiflexion           ?Ankle plantarflexion           ?Ankle inversion           ?Ankle eversion           ? (Blank rows = not tested) ?  ?LE MMT: ?  ?MMT ?  Right ?01/16/2022 Left ?01/16/2022 Right ?01/28/2022 Right ?02/21/2022 Left ?02/21/2022 Right ?03/07/2022 Right ?03/14/2022 Right ?04/03/2022  ?Hip flexion 4/5 5/5        ?Hip extension            ?Hip abduction            ?Hip adduction            ?Hip internal rotation            ?Hip external rotation            ?Knee flexion 2/5  5/5        ?Knee extension 1+ 5/5 3/5 (no resistance testing today) 4/5 ?36, 33 lbs 5/5 ?91.9, 93.5 lbs  5/5 ?46, 41 lbs 5/5 ?57, 55 lbs 5/5 ?64.5, 69.9 lbs  ?Ankle dorsiflexion 5/5 5/5        ?Ankle plantarflexion            ?Ankle inversion 5/5 5/5        ?Ankle eversion 5/5 5/5        ? (Blank rows = not tested) ?  ?  ?  ?FUNCTIONAL TESTS:  ?03/14/2022:  Rt SLS 30 seconds even ground ? ?02/11/2022:  Rt SLS: 25 seconds ? ?01/28/2022: able to perform SLR x 15 c good control( Requirement noted from MD note for ambulation without immobilizer ? ?01/16/2022:  Rt SLS: unable ?  ?GAIT: ?01/28/2022: Ambulation into clinic without immobilizer, decreased stance on Lt, lacking full TKE in terminal swing ? ?01/20/2022: Immobilizer Rt knee c no crutches noted ? ? ?  ?Today's Treatment:  ?04/03/2022: ? ?BFR   : 196 mmHg, exercise 150 mmHg LOP, cuff size 4 Rt leg ? ?Therex:       UBE LE only lvl 4.5 , 5.5 mins ?  Single leg split squat with chair 2 x 15 bilateral ?  Single leg bridge on BOSU ball x 20, 2 x 15 with BFR decreased to 120 mmHG ?  6 inch drop off step loaded in partial knee flexion double leg 2 x 10 with Rt leg retro step up on 6 inch step ?   ? ?Neuro re-ed:   ?  Y balance reaches x 10 each direction bilaterally ?  Upside down bosu ball SLS c ball toss 2 x 15 bilateral ?  Upside down bosu ball squats to approx.  80 degrees 2 x 15 ? ? ?04/01/2022: ? ?BFR   : 196 mmHg, exercise 150 mmHg LOP, cuff size 4 Rt leg ? ?Therex:       Recumbent bike lvl 5 6 mins with BFR ?  Squats on BOSU ball with BFR limited to 50-60 flexion 30, 3 x 15 ?  Leg Press: 100# Rt LE only BFR  x 30, 3 x 15 ?  Single leg bridge on BOSU ball x 20, 2 x 15 with BFR decreased to 120 mmHG ?   ?   ? ?Neuro re-ed:   ? SLS: taps off 6 inch step with BFR x 30, 3 x 15, with Level 4 theraband around Rt knee ? Rt SLS on BOSU ball 30 seconds x 3  ? Rt SLS on BOSU ball with cone taps lateral with 3 cones ? ? ?03/25/2022:  ? ?BFR   : 196 mmHg, exercise 150 mmHg LOP, cuff  size 4 Rt leg ? ?Therex:       Recumbent bike lvl 5 5 mins ?  Reverse lunge slider 2 x 15 bilateral ?  Squats on BOSU ball with BFR limited to 50-60 flexion 30, 3 x 15 ?  Leg Press: 93# Rt LE only BFR  x30, 3 x 15 ?  Seated SLR c BFR 150 mmHg x30, 3x15 ? ?Neuro re-ed:   ?  SLS on foam tapping 3 cones placed across front of Airex mat, and 3 placed in front of mat each with 10 taps ? ?   ? ?03/19/2022:  ? ?BFR   : 196 mmHg, exercise 150 mmHg LOP, cuff size 4 Rt leg ? ?Therex:       Recumbent bike lvl 5 5 mins ?  Reverse lunge slider 2 x 15 bilateral ?  Fwd step up c TKE blue band 2 sec hold 3 x 15 8 inch step ?  Wall sit to fatigue (Lt leg slide anterior placement compared to Rt) in 50-60 deg flexion - 5 mins ?  Seated SLR c BFR 150 mmHg x30, 3x15 ? ?Neuro re-ed:   ?  SLS on foam c anterior, anterior/medial, anterior/lateral cone touches x 10 bilateral ?  SLS balance c 2 tennis ball on foam tossing 20x 3 bilaterally performed ?   ? ?PATIENT EDUCATION:  ?01/16/2022:  ?Education details: HEP, POC, precautions ?Person educated: Patient ?Education method: Explanation, Demonstration, Verbal cues, and Handouts ?Education comprehension: verbalized understanding and returned demonstration ?  ?  ?HOME EXERCISE PROGRAM: ?Access Code: [email or web address] ? ?  ?  ?ASSESSMENT: ?  ?CLINICAL IMPRESSION: ?Pt to benefit from continued strengthening program in WB activity paired c dynamic balance control and early plyometric loading to improve response to weight displacements to facilitate continued progression back to PLOF.  ?  ?REHAB POTENTIAL: Good ?  ?CLINICAL DECISION MAKING: Stable/uncomplicated ?  ?EVALUATION COMPLEXITY: Low ?  ?  ?GOALS: ?Goals reviewed with patient? Yes ?  ?SHORT TERM GOALS: ? ?STG Name Target Date Goal status  ?1 Patient will demonstrate independent use of home exercise program to maintain progress from in clinic treatments.   02/06/2022 Met  ?2 Patient will demonstrate safe independent ambulation community distances   02/06/2022 Met ?Assessed 01/30/2022  ?         ?         ?         ?         ?         ?  ?LONG TERM  GOALS:  ?  ?LTG Name Target Date Goal status  ?1 Patient will demonstrate/report pain at worst less than or [place]

## 2022-04-07 ENCOUNTER — Ambulatory Visit (INDEPENDENT_AMBULATORY_CARE_PROVIDER_SITE_OTHER): Payer: No Typology Code available for payment source | Admitting: Rehabilitative and Restorative Service Providers"

## 2022-04-07 ENCOUNTER — Other Ambulatory Visit: Payer: Self-pay

## 2022-04-07 ENCOUNTER — Encounter: Payer: Self-pay | Admitting: Rehabilitative and Restorative Service Providers"

## 2022-04-07 DIAGNOSIS — R262 Difficulty in walking, not elsewhere classified: Secondary | ICD-10-CM

## 2022-04-07 DIAGNOSIS — M6281 Muscle weakness (generalized): Secondary | ICD-10-CM | POA: Diagnosis not present

## 2022-04-07 DIAGNOSIS — M25661 Stiffness of right knee, not elsewhere classified: Secondary | ICD-10-CM | POA: Diagnosis not present

## 2022-04-07 DIAGNOSIS — R600 Localized edema: Secondary | ICD-10-CM

## 2022-04-07 DIAGNOSIS — M25561 Pain in right knee: Secondary | ICD-10-CM

## 2022-04-07 NOTE — Therapy (Signed)
?OUTPATIENT PHYSICAL THERAPY TREATMENT NOTE  ? ? ?Patient Name: Leroy Griffith ?MRN: [id number] ?DOB:14-Jan-1986, 36 y.o., male ?Today's Date: 04/07/2022 ? ?PCP: Pcp, No ?REFERRING PROVIDER: Meredith Pel, MD ? ? ? PT End of Session - 04/07/22 0902   ? ? Visit Number 20   ? Number of Visits 32   ? Date for PT Re-Evaluation 05/23/22   ? Authorization Type Workers Compensation   ? Authorization - Visit Number 5   ? Authorization - Number of Visits 10   ? Progress Note Due on Visit 20   ? PT Start Time [phone]   ? PT Stop Time [phone]   ? PT Time Calculation (min) 47 min   ? Activity Tolerance Patient tolerated treatment well   ? Behavior During Therapy [facility] for tasks assessed/performed   ? ?  ?  ? ?  ? ? ? ? ? ?Past Medical History:  ?Diagnosis Date  ? GERD (gastroesophageal reflux disease)   ? Sleep apnea   ? ?Past Surgical History:  ?Procedure Laterality Date  ? ANTERIOR CRUCIATE LIGAMENT REPAIR Right 01/07/2022  ? Procedure: RIGHT KNEE ANTERIOR CRUCIATE LIGAMENT RECONSTRUCTION QUAD AUTOGRAFT, PARTIAL MEDIAL MENISCECTOMY;  Surgeon: Meredith Pel, MD;  Location: [facility];  Service: Orthopedics;  Laterality: Right;  ? ?Patient Active Problem List  ? Diagnosis Date Noted  ? Rupture of anterior cruciate ligament of right knee   ? Acute medial meniscus tear of right knee   ? ? ?REFERRING PROVIDER: Meredith Pel, MD ?  ?REFERRING DIAG: I78.676H (ICD-10-CM) - Rupture of anterior cruciate ligament of right knee, initial encounter ? ?THERAPY DIAG:  ?Acute pain of right knee ? ?Stiffness of right knee, not elsewhere classified ? ?Difficulty in walking, not elsewhere classified ? ?Muscle weakness (generalized) ? ?Localized edema ? ?PERTINENT HISTORY: unremarkable ? ?PRECAUTIONS: No OKC loaded ?  ?WEIGHT BEARING RESTRICTIONS None at this time.  ? ?SUBJECTIVE: Pt indicated no pain today.  He indicated he had some pinch at times with up/down walking in yard work.  ? ? ?PAIN:  ?Are you having pain? No pain at rest ?NPRS  scale:0/10 ?Pain location: Rt knee  ?Pain orientation: Right  ?PAIN TYPE: surgery  ?Pain description:  ?Aggravating factors:  ?Relieving factors: ? ? ?OBJECTIVE:  ?  ?  ?  ?PATIENT SURVEYS:  ? 03/14/2022:  FOTO update:  67  ?01/16/2022 FOTO intake:  43 (predicted:  75) ?  ?COGNITION: ?         01/16/2022: Overall cognitive status: Within functional limits for tasks assessed                  ?          ?SENSATION: ?         01/16/2022: Light touch: Appears intact ?  ?Edema/Atrophy: ?         01/16/2022: jt measurement Lt: 14.5 inch   Rt: 16.5 inch ?                         Quad 6 inches proximal to superior patellar border Lt: 21 inch    Rt: 20.5 inch ?  ?  ?PALPATION: ?01/16/2022: Mild tenderness around incisions, distal quad insertion ?  ?LE AROM/PROM: ?  ?A/PROM Right ?01/16/2022 Left ?01/16/2022 Right ?01/28/2022 Right ?02/04/2022 Right ?02/18/2022 Right ?03/14/2022 Rt ?03/25/2022  ?Hip flexion           ?Hip extension           ?  Hip abduction           ?Hip adduction           ?Hip internal rotation           ?Hip external rotation           ?Knee flexion AROM in supine heel slide: 90 ?  ?PROM in supine heel slide: 93 AROM in supine heel slide: 146 AROM in supine heel slide: 114 degrees AROM in supine ?Heel slide: 115 degrees AROM in supine heel slide 130 degrees AROM in supine heel slide 135  AROM  ?Supine heel slide 136 degrees ?  ?Knee extension AROM in LAQ: -50 c pain noted ?PROM in supine heel prop -2 ?  AROM in LAQ:  2 degrees hyperextension ?  AROM in sitting LAQ: -5 degrees AROM in supine ?-5 degrees  AROM 0 deg AROM ?0 degrees  ?Ankle dorsiflexion           ?Ankle plantarflexion           ?Ankle inversion           ?Ankle eversion           ? (Blank rows = not tested) ?  ?LE MMT: ?  ?MMT ?  Right ?01/16/2022 Left ?01/16/2022 Right ?01/28/2022 Right ?02/21/2022 Left ?02/21/2022 Right ?03/07/2022 Right ?03/14/2022 Right ?04/03/2022  ?Hip flexion 4/5 5/5        ?Hip extension            ?Hip abduction            ?Hip adduction             ?Hip internal rotation            ?Hip external rotation            ?Knee flexion 2/5 5/5        ?Knee extension 1+ 5/5 3/5 (no resistance testing today) 4/5 ?36, 33 lbs 5/5 ?91.9, 93.5 lbs  5/5 ?46, 41 lbs 5/5 ?57, 55 lbs 5/5 ?64.5, 69.9 lbs  ?Ankle dorsiflexion 5/5 5/5        ?Ankle plantarflexion            ?Ankle inversion 5/5 5/5        ?Ankle eversion 5/5 5/5        ? (Blank rows = not tested) ?  ?  ?  ?FUNCTIONAL TESTS:  ?04/07/2022:  Y balance testing Lt composite school: 101%, Rt: 95% ? ?03/14/2022:  Rt SLS 30 seconds even ground ? ?02/11/2022:  Rt SLS: 25 seconds ? ?01/28/2022: able to perform SLR x 15 c good control( Requirement noted from MD note for ambulation without immobilizer ? ?01/16/2022:  Rt SLS: unable ?  ?GAIT: ?01/28/2022: Ambulation into clinic without immobilizer, decreased stance on Lt, lacking full TKE in terminal swing ? ?01/20/2022: Immobilizer Rt knee c no crutches noted ? ? ?  ?Today's Treatment:  ?04/07/2022: ? ?BFR   : 196 mmHg, exercise 150 mmHg LOP, cuff size 4 Rt leg ? ?Therex:       Recumbent bike Lvl 5 5 mins ?  Single leg split squat with chair c BFR 4 x 15,  ?RDL c 5 lb kettle bell c BFR 2 x 15 Rt to 4 inch block ?  6 inch drop off step loaded in partial knee flexion double leg 2 x 10 c bunny hop double leg with Rt leg retro step up on 6 inch step ?   ? ?  Neuro re-ed:   ?  RDL c 5 lb kettle bell c BFR   ?Y balance reaches x 3 each direction bilaterally ?  SLS on foam 3 cone touching contralateral leg x 10 bilateral ?  Upside down bosu ball squats to approx. 80 degrees 2 x 15 ? ?04/03/2022: ? ?BFR   : 196 mmHg, exercise 150 mmHg LOP, cuff size 4 Rt leg ? ?Therex:       UBE LE only lvl 4.5 , 5.5 mins ?  Single leg split squat with chair 2 x 15 bilateral ?  Single leg bridge on BOSU ball x 20, 2 x 15 with BFR decreased to 120 mmHG ?  6 inch drop off step loaded in partial knee flexion double leg 2 x 10 with Rt leg retro step up on 6 inch step ?   ? ?Neuro re-ed:   ?  Y balance reaches x 10  each direction bilaterally ?  Upside down bosu ball SLS c ball toss 2 x 15 bilateral ?  Upside down bosu ball squats to approx. 80 degrees 2 x 15 ? ? ?04/01/2022: ? ?BFR   : 196 mmHg, exercise 150 mmHg LOP, cuff size 4 Rt leg ? ?Therex:       Recumbent bike lvl 5 6 mins with BFR ?  Squats on BOSU ball with BFR limited to 50-60 flexion 30, 3 x 15 ?  Leg Press: 100# Rt LE only BFR  x 30, 3 x 15 ?  Single leg bridge on BOSU ball x 20, 2 x 15 with BFR decreased to 120 mmHG ?   ?   ? ?Neuro re-ed:   ? SLS: taps off 6 inch step with BFR x 30, 3 x 15, with Level 4 theraband around Rt knee ? Rt SLS on BOSU ball 30 seconds x 3  ? Rt SLS on BOSU ball with cone taps lateral with 3 cones ? ? ?03/25/2022:  ? ?BFR   : 196 mmHg, exercise 150 mmHg LOP, cuff size 4 Rt leg ? ?Therex:       Recumbent bike lvl 5 5 mins ?  Reverse lunge slider 2 x 15 bilateral ?  Squats on BOSU ball with BFR limited to 50-60 flexion 30, 3 x 15 ?  Leg Press: 93# Rt LE only BFR  x30, 3 x 15 ?  Seated SLR c BFR 150 mmHg x30, 3x15 ? ?Neuro re-ed:   ?  SLS on foam tapping 3 cones placed across front of Airex mat, and 3 placed in front of mat each with 10 taps ? ?   ? ?PATIENT EDUCATION:  ?01/16/2022:  ?Education details: HEP, POC, precautions ?Person educated: Patient ?Education method: Explanation, Demonstration, Verbal cues, and Handouts ?Education comprehension: verbalized understanding and returned demonstration ?  ?  ?HOME EXERCISE PROGRAM: ?Access Code: [email or web address] ? ?  ?  ?ASSESSMENT: ?  ?CLINICAL IMPRESSION: ?Y balance testing showed overall good score with Rt lower than Lt as noted.  Of note in testing, anterior reach lacking on both legs compared to posterior directional reaching.  ?  ?REHAB POTENTIAL: Good ?  ?CLINICAL DECISION MAKING: Stable/uncomplicated ?  ?EVALUATION COMPLEXITY: Low ?  ?  ?GOALS: ?Goals reviewed with patient? Yes ?  ?SHORT TERM GOALS: ? ?STG Name Target Date Goal status  ?1 Patient will demonstrate independent use of home exercise  program to maintain progress from in clinic treatments.   02/06/2022 Met  ?2 Patient will demonstrate safe independent ambulation community distances  02/06/2022 Met ?Assessed 01/30/2022  ?         ?         ?

## 2022-04-09 ENCOUNTER — Other Ambulatory Visit: Payer: Self-pay

## 2022-04-09 ENCOUNTER — Encounter: Payer: Self-pay | Admitting: Rehabilitative and Restorative Service Providers"

## 2022-04-09 ENCOUNTER — Ambulatory Visit (INDEPENDENT_AMBULATORY_CARE_PROVIDER_SITE_OTHER): Payer: No Typology Code available for payment source | Admitting: Rehabilitative and Restorative Service Providers"

## 2022-04-09 DIAGNOSIS — M25561 Pain in right knee: Secondary | ICD-10-CM

## 2022-04-09 DIAGNOSIS — M25661 Stiffness of right knee, not elsewhere classified: Secondary | ICD-10-CM

## 2022-04-09 DIAGNOSIS — M6281 Muscle weakness (generalized): Secondary | ICD-10-CM

## 2022-04-09 DIAGNOSIS — R600 Localized edema: Secondary | ICD-10-CM

## 2022-04-09 DIAGNOSIS — R262 Difficulty in walking, not elsewhere classified: Secondary | ICD-10-CM | POA: Diagnosis not present

## 2022-04-09 NOTE — Therapy (Signed)
?OUTPATIENT PHYSICAL THERAPY TREATMENT NOTE  ? ? ?Patient Name: Leroy Griffith ?MRN: [id number] ?DOB:[birth year], 36 y.o., male ?Today's Date: 04/09/2022 ? ?PCP: Pcp, No ?REFERRING PROVIDER: Cammy Copa, MD ? ? ? PT End of Session - 04/09/22 4098   ? ? Visit Number 21   ? Number of Visits 32   ? Date for PT Re-Evaluation 05/23/22   ? Authorization Type Workers Compensation   ? Authorization - Visit Number 6   ? Authorization - Number of Visits 10   ? Progress Note Due on Visit 20   ? PT Start Time 0800   ? PT Stop Time 0841   ? PT Time Calculation (min) 41 min   ? Activity Tolerance Patient tolerated treatment well   ? Behavior During Therapy [facility] for tasks assessed/performed   ? ?  ?  ? ?  ? ? ? ? ? ? ?Past Medical History:  ?Diagnosis Date  ? GERD (gastroesophageal reflux disease)   ? Sleep apnea   ? ?Past Surgical History:  ?Procedure Laterality Date  ? ANTERIOR CRUCIATE LIGAMENT REPAIR Right 01/07/2022  ? Procedure: RIGHT KNEE ANTERIOR CRUCIATE LIGAMENT RECONSTRUCTION QUAD AUTOGRAFT, PARTIAL MEDIAL MENISCECTOMY;  Surgeon: Cammy Copa, MD;  Location: [facility] OR;  Service: Orthopedics;  Laterality: Right;  ? ?Patient Active Problem List  ? Diagnosis Date Noted  ? Rupture of anterior cruciate ligament of right knee   ? Acute medial meniscus tear of right knee   ? ? ?REFERRING PROVIDER: Cammy Copa, MD ?  ?REFERRING DIAG: J19.147W (ICD-10-CM) - Rupture of anterior cruciate ligament of right knee, initial encounter ? ?THERAPY DIAG:  ?Acute pain of right knee ? ?Stiffness of right knee, not elsewhere classified ? ?Difficulty in walking, not elsewhere classified ? ?Muscle weakness (generalized) ? ?Localized edema ? ?PERTINENT HISTORY: unremarkable ? ?PRECAUTIONS: No OKC loaded ?  ?WEIGHT BEARING RESTRICTIONS None at this time.  ? ?SUBJECTIVE: Pt indicated medial side of patella still shows some mild discomfort with going down stairs.  Mentioned hamstring soreness/tired after last visit.  ? ? ?PAIN:  ?Are you  having pain? No pain at rest ?NPRS scale:0/10 ?Pain location: Rt knee  ?Pain orientation: Right  ?PAIN TYPE: surgery  ?Pain description:  ?Aggravating factors:  ?Relieving factors: ? ? ?OBJECTIVE:  ?  ?  ?  ?PATIENT SURVEYS:  ? 03/14/2022:  FOTO update:  67  ?01/16/2022 FOTO intake:  43 (predicted:  75) ?  ?COGNITION: ?         01/16/2022: Overall cognitive status: Within functional limits for tasks assessed                  ?          ?SENSATION: ?         01/16/2022: Light touch: Appears intact ?  ?Edema/Atrophy: ?         01/16/2022: jt measurement Lt: 14.5 inch   Rt: 16.5 inch ?                         Quad 6 inches proximal to superior patellar border Lt: 21 inch    Rt: 20.5 inch ?  ?  ?PALPATION: ?01/16/2022: Mild tenderness around incisions, distal quad insertion ?  ?LE AROM/PROM: ?  ?A/PROM Right ?01/16/2022 Left ?01/16/2022 Right ?01/28/2022 Right ?02/04/2022 Right ?02/18/2022 Right ?03/14/2022 Rt ?03/25/2022  ?Hip flexion           ?Hip extension           ?  Hip abduction           ?Hip adduction           ?Hip internal rotation           ?Hip external rotation           ?Knee flexion AROM in supine heel slide: 90 ?  ?PROM in supine heel slide: 93 AROM in supine heel slide: 146 AROM in supine heel slide: 114 degrees AROM in supine ?Heel slide: 115 degrees AROM in supine heel slide 130 degrees AROM in supine heel slide 135  AROM  ?Supine heel slide 136 degrees ?  ?Knee extension AROM in LAQ: -50 c pain noted ?PROM in supine heel prop -2 ?  AROM in LAQ:  2 degrees hyperextension ?  AROM in sitting LAQ: -5 degrees AROM in supine ?-5 degrees  AROM 0 deg AROM ?0 degrees  ?Ankle dorsiflexion           ?Ankle plantarflexion           ?Ankle inversion           ?Ankle eversion           ? (Blank rows = not tested) ?  ?LE MMT: ?  ?MMT ?  Right ?01/16/2022 Left ?01/16/2022 Right ?01/28/2022 Right ?02/21/2022 Left ?02/21/2022 Right ?03/07/2022 Right ?03/14/2022 Right ?04/03/2022  ?Hip flexion 4/5 5/5        ?Hip extension            ?Hip abduction             ?Hip adduction            ?Hip internal rotation            ?Hip external rotation            ?Knee flexion 2/5 5/5        ?Knee extension 1+ 5/5 3/5 (no resistance testing today) 4/5 ?36, 33 lbs 5/5 ?91.9, 93.5 lbs  5/5 ?46, 41 lbs 5/5 ?57, 55 lbs 5/5 ?64.5, 69.9 lbs  ?Ankle dorsiflexion 5/5 5/5        ?Ankle plantarflexion            ?Ankle inversion 5/5 5/5        ?Ankle eversion 5/5 5/5        ? (Blank rows = not tested) ?  ?  ?  ?FUNCTIONAL TESTS:  ?04/07/2022:  Y balance testing Lt composite school: 101%, Rt: 95% ? ?03/14/2022:  Rt SLS 30 seconds even ground ? ?02/11/2022:  Rt SLS: 25 seconds ? ?01/28/2022: able to perform SLR x 15 c good control( Requirement noted from MD note for ambulation without immobilizer ? ?01/16/2022:  Rt SLS: unable ?  ?GAIT: ?01/28/2022: Ambulation into clinic without immobilizer, decreased stance on Lt, lacking full TKE in terminal swing ? ?01/20/2022: Immobilizer Rt knee c no crutches noted ? ? ?  ?Today's Treatment:  ?04/09/2022: ? ?BFR   : 196 mmHg, exercise 150 mmHg LOP, cuff size 4 Rt leg ? ?Therex:       Reverse ambulation on treadmill incline 3.5 speed 1.7 mph 6 mins  ?  Spanish squat c blue band behind knees c BFR 10 lb kettle bell 30, 3 x 15 ?Lateral step down 6 inch step eccentric focus Rt loading c blue band TKE 2 x 15 ?  6 inch drop off step loaded in partial knee flexion double leg 2 x 10 c bunny hop double leg with  Rt leg retro step up on 6 inch step ?   ? ?Neuro re-ed:   ?  SLS c slider fwd, lateral, side light touch x 15 bilateral ?  Reactive light command lateral stepping SL loading 2 sec time out 30 sec x 5 bilateral ?   ?04/07/2022: ? ?BFR   : 196 mmHg, exercise 150 mmHg LOP, cuff size 4 Rt leg ? ?Therex:       Recumbent bike Lvl 5 5 mins ?  Single leg split squat with chair c BFR 4 x 15,  ?RDL c 5 lb kettle bell c BFR 2 x 15 Rt to 4 inch block ?  6 inch drop off step loaded in partial knee flexion double leg 2 x 10 c bunny hop double leg with Rt leg retro step up  on 6 inch step ?   ? ?Neuro re-ed:   ?  RDL c 5 lb kettle bell c BFR   ?Y balance reaches x 3 each direction bilaterally ?  SLS on foam 3 cone touching contralateral leg x 10 bilateral ?  Upside down bosu ball squats to approx. 80 degrees 2 x 15 ? ?04/03/2022: ? ?BFR   : 196 mmHg, exercise 150 mmHg LOP, cuff size 4 Rt leg ? ?Therex:       UBE LE only lvl 4.5 , 5.5 mins ?  Single leg split squat with chair 2 x 15 bilateral ?  Single leg bridge on BOSU ball x 20, 2 x 15 with BFR decreased to 120 mmHG ?  6 inch drop off step loaded in partial knee flexion double leg 2 x 10 with Rt leg retro step up on 6 inch step ?   ? ?Neuro re-ed:   ?  Y balance reaches x 10 each direction bilaterally ?  Upside down bosu ball SLS c ball toss 2 x 15 bilateral ?  Upside down bosu ball squats to approx. 80 degrees 2 x 15 ? ? ?04/01/2022: ? ?BFR   : 196 mmHg, exercise 150 mmHg LOP, cuff size 4 Rt leg ? ?Therex:       Recumbent bike lvl 5 6 mins with BFR ?  Squats on BOSU ball with BFR limited to 50-60 flexion 30, 3 x 15 ?  Leg Press: 100# Rt LE only BFR  x 30, 3 x 15 ?  Single leg bridge on BOSU ball x 20, 2 x 15 with BFR decreased to 120 mmHG ?   ?   ? ?Neuro re-ed:   ? SLS: taps off 6 inch step with BFR x 30, 3 x 15, with Level 4 theraband around Rt knee ? Rt SLS on BOSU ball 30 seconds x 3  ? Rt SLS on BOSU ball with cone taps lateral with 3 cones ? ? ? ?PATIENT EDUCATION:  ?01/16/2022:  ?Education details: HEP, POC, precautions ?Person educated: Patient ?Education method: Explanation, Demonstration, Verbal cues, and Handouts ?Education comprehension: verbalized understanding and returned demonstration ?  ?  ?HOME EXERCISE PROGRAM: ?Access Code: [email or web address] ? ?  ?  ?ASSESSMENT: ?  ?CLINICAL IMPRESSION: ?Pt to continue to benefit from progressive strengthening and dynamic balance control improvements.  Rt leg fatigue evident compared to Lt in similar activity but overall steady progress each week.  ?  ?REHAB POTENTIAL: Good ?  ?CLINICAL  DECISION MAKING: Stable/uncomplicated ?  ?EVALUATION COMPLEXITY: Low ?  ?  ?GOALS: ?Goals reviewed with patient? Yes ?  ?SHORT TERM GOALS: ? ?STG Name Target Date Goal status  ?  1 Patient will demonstrat

## 2022-04-16 ENCOUNTER — Encounter: Payer: Self-pay | Admitting: Orthopedic Surgery

## 2022-04-16 ENCOUNTER — Other Ambulatory Visit: Payer: Self-pay

## 2022-04-16 ENCOUNTER — Ambulatory Visit (INDEPENDENT_AMBULATORY_CARE_PROVIDER_SITE_OTHER): Payer: PRIVATE HEALTH INSURANCE | Admitting: Orthopedic Surgery

## 2022-04-16 ENCOUNTER — Ambulatory Visit (INDEPENDENT_AMBULATORY_CARE_PROVIDER_SITE_OTHER): Payer: No Typology Code available for payment source | Admitting: Rehabilitative and Restorative Service Providers"

## 2022-04-16 ENCOUNTER — Encounter: Payer: Self-pay | Admitting: Rehabilitative and Restorative Service Providers"

## 2022-04-16 DIAGNOSIS — M6281 Muscle weakness (generalized): Secondary | ICD-10-CM | POA: Diagnosis not present

## 2022-04-16 DIAGNOSIS — R262 Difficulty in walking, not elsewhere classified: Secondary | ICD-10-CM | POA: Diagnosis not present

## 2022-04-16 DIAGNOSIS — S83511A Sprain of anterior cruciate ligament of right knee, initial encounter: Secondary | ICD-10-CM

## 2022-04-16 DIAGNOSIS — M25661 Stiffness of right knee, not elsewhere classified: Secondary | ICD-10-CM

## 2022-04-16 DIAGNOSIS — M25561 Pain in right knee: Secondary | ICD-10-CM

## 2022-04-16 DIAGNOSIS — R600 Localized edema: Secondary | ICD-10-CM

## 2022-04-16 NOTE — Therapy (Signed)
?OUTPATIENT PHYSICAL THERAPY TREATMENT NOTE  ? ? ?Patient Name: Leroy Griffith ?MRN: [id number] ?DOB:[birth year], 36 y.o., male ?Today's Date: 04/16/2022 ? ?PCP: Pcp, No ?REFERRING PROVIDER: Meredith Pel, MD ? ? ? PT End of Session - 04/16/22 6283   ? ? Visit Number 22   ? Number of Visits 32   ? Date for PT Re-Evaluation 05/23/22   ? Authorization Type Workers Compensation   ? Authorization Time Period until 5/10   ? Authorization - Visit Number 7   ? Authorization - Number of Visits 10   ? Progress Note Due on Visit 20   ? PT Start Time [phone]   ? PT Stop Time [phone]   ? PT Time Calculation (min) 42 min   ? Activity Tolerance Patient tolerated treatment well   ? Behavior During Therapy [facility] for tasks assessed/performed   ? ?  ?  ? ?  ? ? ? ? ? ? ? ?Past Medical History:  ?Diagnosis Date  ? GERD (gastroesophageal reflux disease)   ? Sleep apnea   ? ?Past Surgical History:  ?Procedure Laterality Date  ? ANTERIOR CRUCIATE LIGAMENT REPAIR Right 01/07/2022  ? Procedure: RIGHT KNEE ANTERIOR CRUCIATE LIGAMENT RECONSTRUCTION QUAD AUTOGRAFT, PARTIAL MEDIAL MENISCECTOMY;  Surgeon: Meredith Pel, MD;  Location: [place];  Service: Orthopedics;  Laterality: Right;  ? ?Patient Active Problem List  ? Diagnosis Date Noted  ? Rupture of anterior cruciate ligament of right knee   ? Acute medial meniscus tear of right knee   ? ? ?REFERRING PROVIDER: Meredith Pel, MD ?  ?REFERRING DIAG: V37.106Y (ICD-10-CM) - Rupture of anterior cruciate ligament of right knee, initial encounter ? ?THERAPY DIAG:  ?Acute pain of right knee ? ?Stiffness of right knee, not elsewhere classified ? ?Difficulty in walking, not elsewhere classified ? ?Muscle weakness (generalized) ? ?Localized edema ? ?PERTINENT HISTORY: unremarkable ? ?PRECAUTIONS: No OKC loaded ?  ?WEIGHT BEARING RESTRICTIONS None at this time.  ? ?SUBJECTIVE: Pt indicated stairs are getting better going down but not quite normal yet.  ? ? ?PAIN:  ?Are you having pain? No pain at  rest ?NPRS scale:0/10 ?Pain location: Rt knee  ?Pain orientation: Right  ?PAIN TYPE: surgery  ?Pain description:  ?Aggravating factors:  ?Relieving factors: ? ? ?OBJECTIVE:   ?PATIENT SURVEYS:  ? 04/16/2022:  FOTO update:  25 ? ?03/14/2022:  FOTO update:  86  ? ?01/16/2022 FOTO intake:  43 (predicted:  75) ?  ?COGNITION: ?         01/16/2022: Overall cognitive status: Within functional limits for tasks assessed                  ?          ?SENSATION: ?         01/16/2022: Light touch: Appears intact ?  ?Edema/Atrophy: ?         01/16/2022: jt measurement Lt: 14.5 inch   Rt: 16.5 inch ?                         Quad 6 inches proximal to superior patellar border Lt: 21 inch    Rt: 20.5 inch ?  ?  ?PALPATION: ?01/16/2022: Mild tenderness around incisions, distal quad insertion ?  ?LE AROM/PROM: ?  ?A/PROM Right ?01/16/2022 Left ?01/16/2022 Right ?01/28/2022 Right ?02/04/2022 Right ?02/18/2022 Right ?03/14/2022 Rt ?03/25/2022  ?Hip flexion           ?Hip extension           ?  Hip abduction           ?Hip adduction           ?Hip internal rotation           ?Hip external rotation           ?Knee flexion AROM in supine heel slide: 90 ?  ?PROM in supine heel slide: 93 AROM in supine heel slide: 146 AROM in supine heel slide: 114 degrees AROM in supine ?Heel slide: 115 degrees AROM in supine heel slide 130 degrees AROM in supine heel slide 135  AROM  ?Supine heel slide 136 degrees ?  ?Knee extension AROM in LAQ: -50 c pain noted ?PROM in supine heel prop -2 ?  AROM in LAQ:  2 degrees hyperextension ?  AROM in sitting LAQ: -5 degrees AROM in supine ?-5 degrees  AROM 0 deg AROM ?0 degrees  ?Ankle dorsiflexion           ?Ankle plantarflexion           ?Ankle inversion           ?Ankle eversion           ? (Blank rows = not tested) ?  ?LE MMT: ?  ?MMT ?  Right ?01/16/2022 Left ?01/16/2022 Right ?01/28/2022 Right ?02/21/2022 Left ?02/21/2022 Right ?03/07/2022 Right ?03/14/2022 Right ?04/03/2022  ?Hip flexion 4/5 5/5        ?Hip extension            ?Hip abduction             ?Hip adduction            ?Hip internal rotation            ?Hip external rotation            ?Knee flexion 2/5 5/5        ?Knee extension 1+ 5/5 3/5 (no resistance testing today) 4/5 ?36, 33 lbs 5/5 ?91.9, 93.5 lbs  5/5 ?46, 41 lbs 5/5 ?57, 55 lbs 5/5 ?64.5, 69.9 lbs  ?Ankle dorsiflexion 5/5 5/5        ?Ankle plantarflexion            ?Ankle inversion 5/5 5/5        ?Ankle eversion 5/5 5/5        ? (Blank rows = not tested) ?  ?  ?  ?FUNCTIONAL TESTS:  ?04/07/2022:  Y balance testing Lt composite school: 101%, Rt: 95% ? ?03/14/2022:  Rt SLS 30 seconds even ground ? ?02/11/2022:  Rt SLS: 25 seconds ? ?01/28/2022: able to perform SLR x 15 c good control( Requirement noted from MD note for ambulation without immobilizer ? ?01/16/2022:  Rt SLS: unable ?  ?GAIT: ?01/28/2022: Ambulation into clinic without immobilizer, decreased stance on Lt, lacking full TKE in terminal swing ? ?01/20/2022: Immobilizer Rt knee c no crutches noted ? ? ?  ?Today's Treatment:  ?04/16/2022: ? ?BFR   : 196 mmHg, exercise 150 mmHg LOP, cuff size 4 Rt leg (not used today) ? ?Therex:       Reverse ambulation on treadmill incline 3.5 speed 1.8 mph 6 mins  ?  Rt leg single leg press 100 lbs to fatigue x 50 ?  Step up and over/down Rt leg WB c focus on eccentric lowering control x 20 - 6 inch ?  Hamstring curl machine Rt leg only 35 lbs 2 x 15 ?   ?Neuro re-ed:   ?  SLS  on upside down bosu 30 sec x 4 bilateral ?  Double leg squat on upside down bosu c 5 sec hold in squat x 15 c ball toss ?  Reactive light command lateral stepping SL loading 2 sec time out 30 sec x 5 bilateral ?  Plymometric reaction Double leg on shuttle TNT 62 lbs c focus on slow lowering upon contact x 30 (cues consistently for improved control focus) ? ?04/09/2022: ? ?BFR   : 196 mmHg, exercise 150 mmHg LOP, cuff size 4 Rt leg ? ?Therex:       Reverse ambulation on treadmill incline 3.5 speed 1.7 mph 6 mins  ?  Spanish squat c blue band behind knees c BFR 10 lb kettle bell 30, 3  x 15 ?Lateral step down 6 inch step eccentric focus Rt loading c blue band TKE 2 x 15 ?  6 inch drop off step loaded in partial knee flexion double leg 2 x 10 c bunny hop double leg with Rt leg retro step up on 6 inch step ?   ? ?Neuro re-ed:   ?  SLS c slider fwd, lateral, side light touch x 15 bilateral ?  Reactive light command lateral stepping SL loading 2 sec time out 30 sec x 5 bilateral ?   ?04/07/2022: ? ?BFR   : 196 mmHg, exercise 150 mmHg LOP, cuff size 4 Rt leg ? ?Therex:       Recumbent bike Lvl 5 5 mins ?  Single leg split squat with chair c BFR 4 x 15,  ?RDL c 5 lb kettle bell c BFR 2 x 15 Rt to 4 inch block ?  6 inch drop off step loaded in partial knee flexion double leg 2 x 10 c bunny hop double leg with Rt leg retro step up on 6 inch step ?   ? ?Neuro re-ed:   ?  RDL c 5 lb kettle bell c BFR   ?Y balance reaches x 3 each direction bilaterally ?  SLS on foam 3 cone touching contralateral leg x 10 bilateral ?  Upside down bosu ball squats to approx. 80 degrees 2 x 15 ? ? ?PATIENT EDUCATION:  ?01/16/2022:  ?Education details: HEP, POC, precautions ?Person educated: Patient ?Education method: Explanation, Demonstration, Verbal cues, and Handouts ?Education comprehension: verbalized understanding and returned demonstration ?  ?  ?HOME EXERCISE PROGRAM: ?Access Code: [email or web address] ? ?  ?  ?ASSESSMENT: ?  ?CLINICAL IMPRESSION: ?MD visit prior to today's visit indicated desire to continue 1x/week for 8 weeks starting next week (will have to get approval from worker's compensation).   Pt to benefit from continued RLE strengthening to progress control in WB activity.  ?  ?REHAB POTENTIAL: Good ?  ?CLINICAL DECISION MAKING: Stable/uncomplicated ?  ?EVALUATION COMPLEXITY: Low ?  ?  ?GOALS: ?Goals reviewed with patient? Yes ?  ?SHORT TERM GOALS: ? ?STG Name Target Date Goal status  ?1 Patient will demonstrate independent use of home exercise program to maintain progress from in clinic treatments.   02/06/2022 Met  ?2  Patient will demonstrate safe independent ambulation community distances  02/06/2022 Met ?Assessed 01/30/2022  ?         ?         ?         ?         ?         ?  ?LONG TERM GOALS:  ?  ?LTG Name Target Dat

## 2022-04-16 NOTE — Progress Notes (Signed)
? ?  Post-Op Visit Note ?  ?Patient: Leroy Griffith           ?Date of Birth: [birth year]           ?MRN: [id number] ?Visit Date: 04/16/2022 ?PCP: Pcp, No ? ? ?Assessment & Plan: ? ?Chief Complaint:  ?Chief Complaint  ?Patient presents with  ? Right Knee - Routine Post Op  ?  01/07/22 right knee ACL reconstruction with PMM ?  ?  ? ?Visit Diagnoses:  ?1. Rupture of anterior cruciate ligament of right knee, initial encounter   ? ? ?Plan: Leroy Griffith is a 36 year old patient who is now about 3 months and a week out from right knee ACL reconstruction and partial lateral meniscectomy.  He has been in therapy doing well.  On exam he is got excellent range of motion and improving quad strength.  Graft is stable.  Plan is continue with therapy 1 time a week for the next 8 weeks would like for him to start working on elliptical and agility training at 4 months postop.  2 months return for final check.  He has been doing some activities such as mowing the grass going up and down stairs.  I think it would be fine for him to start doing some swimming but no breaststroke. ? ?Follow-Up Instructions: Return in about 8 weeks (around 06/11/2022).  ? ?Orders:  ?No orders of the defined types were placed in this encounter. ? ?No orders of the defined types were placed in this encounter. ? ? ?Imaging: ?No results found. ? ?PMFS History: ?Patient Active Problem List  ? Diagnosis Date Noted  ? Rupture of anterior cruciate ligament of right knee   ? Acute medial meniscus tear of right knee   ? ?Past Medical History:  ?Diagnosis Date  ? GERD (gastroesophageal reflux disease)   ? Sleep apnea   ?  ?History reviewed. No pertinent family history.  ?Past Surgical History:  ?Procedure Laterality Date  ? ANTERIOR CRUCIATE LIGAMENT REPAIR Right 01/07/2022  ? Procedure: RIGHT KNEE ANTERIOR CRUCIATE LIGAMENT RECONSTRUCTION QUAD AUTOGRAFT, PARTIAL MEDIAL MENISCECTOMY;  Surgeon: Cammy Copa, MD;  Location: [facility] OR;  Service: Orthopedics;  Laterality: Right;   ? ?Social History  ? ?Occupational History  ? Not on file  ?Tobacco Use  ? Smoking status: Never  ? Smokeless tobacco: Never  ?Vaping Use  ? Vaping Use: Never used  ?Substance and Sexual Activity  ? Alcohol use: Yes  ?  Comment: occassionally  ? Drug use: Never  ? Sexual activity: Not on file  ? ? ? ?

## 2022-04-18 ENCOUNTER — Encounter: Payer: Self-pay | Admitting: Rehabilitative and Restorative Service Providers"

## 2022-04-18 ENCOUNTER — Ambulatory Visit (INDEPENDENT_AMBULATORY_CARE_PROVIDER_SITE_OTHER): Payer: PRIVATE HEALTH INSURANCE | Admitting: Rehabilitative and Restorative Service Providers"

## 2022-04-18 DIAGNOSIS — R600 Localized edema: Secondary | ICD-10-CM

## 2022-04-18 DIAGNOSIS — M25561 Pain in right knee: Secondary | ICD-10-CM

## 2022-04-18 DIAGNOSIS — M6281 Muscle weakness (generalized): Secondary | ICD-10-CM | POA: Diagnosis not present

## 2022-04-18 DIAGNOSIS — R262 Difficulty in walking, not elsewhere classified: Secondary | ICD-10-CM

## 2022-04-18 DIAGNOSIS — M25661 Stiffness of right knee, not elsewhere classified: Secondary | ICD-10-CM | POA: Diagnosis not present

## 2022-04-18 NOTE — Therapy (Signed)
?OUTPATIENT PHYSICAL THERAPY TREATMENT NOTE  ? ? ?Patient Name: Leroy Griffith ?MRN: [id number] ?DOB:07/23/1986, 36 y.o., male ?Today's Date: 04/18/2022 ? ?PCP: Pcp, No ?REFERRING PROVIDER: Meredith Pel, MD ? ? ? PT End of Session - 04/18/22 0737   ? ? Visit Number 23   ? Number of Visits 32   ? Date for PT Re-Evaluation 05/23/22   ? Authorization Type Workers Compensation   ? Authorization Time Period until 5/10   ? Authorization - Visit Number 8   ? Authorization - Number of Visits 10   ? Progress Note Due on Visit 20   ? PT Start Time 0805   ? PT Stop Time 0845   ? PT Time Calculation (min) 40 min   ? Activity Tolerance Patient tolerated treatment well   ? Behavior During Therapy [facility] for tasks assessed/performed   ? ?  ?  ? ?  ? ? ? ? ? ? ? ? ?Past Medical History:  ?Diagnosis Date  ? GERD (gastroesophageal reflux disease)   ? Sleep apnea   ? ?Past Surgical History:  ?Procedure Laterality Date  ? ANTERIOR CRUCIATE LIGAMENT REPAIR Right 01/07/2022  ? Procedure: RIGHT KNEE ANTERIOR CRUCIATE LIGAMENT RECONSTRUCTION QUAD AUTOGRAFT, PARTIAL MEDIAL MENISCECTOMY;  Surgeon: Meredith Pel, MD;  Location: [place];  Service: Orthopedics;  Laterality: Right;  ? ?Patient Active Problem List  ? Diagnosis Date Noted  ? Rupture of anterior cruciate ligament of right knee   ? Acute medial meniscus tear of right knee   ? ? ?REFERRING PROVIDER: Meredith Pel, MD ?  ?REFERRING DIAG: T06.269S (ICD-10-CM) - Rupture of anterior cruciate ligament of right knee, initial encounter ? ?THERAPY DIAG:  ?Acute pain of right knee ? ?Stiffness of right knee, not elsewhere classified ? ?Difficulty in walking, not elsewhere classified ? ?Muscle weakness (generalized) ? ?Localized edema ? ?PERTINENT HISTORY: unremarkable ? ?PRECAUTIONS: No OKC loaded ?  ?WEIGHT BEARING RESTRICTIONS None at this time.  ? ?SUBJECTIVE: Pt indicated no complaints of pain since last visit.  ? ? ?PAIN:  ?Are you having pain? No pain at rest ?NPRS  scale:0/10 ?Pain location: Rt knee  ?Pain orientation: Right  ?PAIN TYPE: surgery  ?Pain description:  ?Aggravating factors:  ?Relieving factors: ? ? ?OBJECTIVE:   ?PATIENT SURVEYS:  ? 04/16/2022:  FOTO update:  34 ? ?03/14/2022:  FOTO update:  60  ? ?01/16/2022 FOTO intake:  43 (predicted:  75) ?  ?COGNITION: ?         01/16/2022: Overall cognitive status: Within functional limits for tasks assessed                  ?          ?SENSATION: ?         01/16/2022: Light touch: Appears intact ?  ?Edema/Atrophy: ?         01/16/2022: jt measurement Lt: 14.5 inch   Rt: 16.5 inch ?                         Quad 6 inches proximal to superior patellar border Lt: 21 inch    Rt: 20.5 inch ?  ?  ?PALPATION: ?01/16/2022: Mild tenderness around incisions, distal quad insertion ?  ?LE AROM/PROM: ?  ?A/PROM Right ?01/16/2022 Left ?01/16/2022 Right ?01/28/2022 Right ?02/04/2022 Right ?02/18/2022 Right ?03/14/2022 Rt ?03/25/2022 Right ?04/18/2022  ?Hip flexion            ?Hip extension            ?  Hip abduction            ?Hip adduction            ?Hip internal rotation            ?Hip external rotation            ?Knee flexion AROM in supine heel slide: 90 ?  ?PROM in supine heel slide: 93 AROM in supine heel slide: 146 AROM in supine heel slide: 114 degrees AROM in supine ?Heel slide: 115 degrees AROM in supine heel slide 130 degrees AROM in supine heel slide 135  AROM  ?Supine heel slide 136 degrees ?   ?Knee extension AROM in LAQ: -50 c pain noted ?PROM in supine heel prop -2 ?  AROM in LAQ:  2 degrees hyperextension ?  AROM in sitting LAQ: -5 degrees AROM in supine ?-5 degrees  AROM 0 deg AROM ?0 degrees   ?Ankle dorsiflexion            ?Ankle plantarflexion            ?Ankle inversion            ?Ankle eversion            ? (Blank rows = not tested) ?  ?LE MMT: ?  ?MMT ?  Right ?01/16/2022 Left ?01/16/2022 Right ?01/28/2022 Right ?02/21/2022 Left ?02/21/2022 Right ?03/07/2022 Right ?03/14/2022 Right ?04/03/2022   ?Hip flexion 4/5 5/5         ?Hip extension              ?Hip abduction             ?Hip adduction             ?Hip internal rotation             ?Hip external rotation             ?Knee flexion 2/5 5/5         ?Knee extension 1+ 5/5 3/5 (no resistance testing today) 4/5 ?36, 33 lbs 5/5 ?91.9, 93.5 lbs  5/5 ?46, 41 lbs 5/5 ?57, 55 lbs 5/5 ?64.5, 69.9 lbs   ?Ankle dorsiflexion 5/5 5/5         ?Ankle plantarflexion             ?Ankle inversion 5/5 5/5         ?Ankle eversion 5/5 5/5         ? (Blank rows = not tested) ?  ?  ?  ?FUNCTIONAL TESTS:  ?04/07/2022:  Y balance testing Lt composite school: 101%, Rt: 95% ? ?03/14/2022:  Rt SLS 30 seconds even ground ? ?02/11/2022:  Rt SLS: 25 seconds ? ?01/28/2022: able to perform SLR x 15 c good control( Requirement noted from MD note for ambulation without immobilizer ? ?01/16/2022:  Rt SLS: unable ?  ?GAIT: ?01/28/2022: Ambulation into clinic without immobilizer, decreased stance on Lt, lacking full TKE in terminal swing ? ?01/20/2022: Immobilizer Rt knee c no crutches noted ? ? ?  ?Today's Treatment:  ?04/18/2022: ? ?BFR   : 196 mmHg, exercise 150 mmHg LOP, cuff size 4 Rt leg  ? ?Therex:       UBE LE only lvl 4.0 5 mins ?  Split squat Rt leg c BFR 150 mmHg - x 30 (0 lbs), 3 x 15 (c 5 lbs) with 30 sec rest breaks ?  Lateral step down heel touch 6 inch 2 x 10 Rt leg ?  Single RDL 10 lb kettle bell x 15 bilateral slowly  ?   ?Neuro re-ed:   ?   ?  Double leg squat hold on upside down bosu c ball toss 3 x 20 tosses ?  SLS in 15 degrees knee flexion c mild forward trunk lean 10 lb kettle bell swing side to side UE handoffs 30 sec x 5 ?   ?04/16/2022: ? ?BFR   : 196 mmHg, exercise 150 mmHg LOP, cuff size 4 Rt leg (not used today) ? ?Therex:       Reverse ambulation on treadmill incline 3.5 speed 1.8 mph 6 mins  ?  Rt leg single leg press 100 lbs to fatigue x 50 ?  Step up and over/down Rt leg WB c focus on eccentric lowering control x 20 - 6 inch ?  Hamstring curl machine Rt leg only 35 lbs 2 x 15 ?   ?Neuro re-ed:   ?  SLS on upside down  bosu 30 sec x 4 bilateral ?  Double leg squat on upside down bosu c 5 sec hold in squat x 15 c ball toss ?  Reactive light command lateral stepping SL loading 2 sec time out 30 sec x 5 bilateral ?  Plymometric reaction Double leg on shuttle TNT 62 lbs c focus on slow lowering upon contact x 30 (cues consistently for improved control focus) ? ?04/09/2022: ? ?BFR   : 196 mmHg, exercise 150 mmHg LOP, cuff size 4 Rt leg ? ?Therex:       Reverse ambulation on treadmill incline 3.5 speed 1.7 mph 6 mins  ?  Spanish squat c blue band behind knees c BFR 10 lb kettle bell 30, 3 x 15 ?Lateral step down 6 inch step eccentric focus Rt loading c blue band TKE 2 x 15 ?  6 inch drop off step loaded in partial knee flexion double leg 2 x 10 c bunny hop double leg with Rt leg retro step up on 6 inch step ?   ? ?Neuro re-ed:   ?  SLS c slider fwd, lateral, side light touch x 15 bilateral ?  Reactive light command lateral stepping SL loading 2 sec time out 30 sec x 5 bilateral ?   ? ? ?PATIENT EDUCATION:  ?01/16/2022:  ?Education details: HEP, POC, precautions ?Person educated: Patient ?Education method: Explanation, Demonstration, Verbal cues, and Handouts ?Education comprehension: verbalized understanding and returned demonstration ?  ?  ?HOME EXERCISE PROGRAM: ?Access Code: [email or web address] ? ?  ?  ?ASSESSMENT: ?  ?CLINICAL IMPRESSION: ?Continued c plan to improve quad strength and control to improve ability to absorb load and control body mechanics in prep for agility and running based progression when appropriate.  ? ? ?MD visit prior to today's visit indicated desire to continue 1x/week for 8 weeks starting next week (will have to get approval from worker's compensation). Will perform note and request on next visit.  ?  ?REHAB POTENTIAL: Good ?  ?CLINICAL DECISION MAKING: Stable/uncomplicated ?  ?EVALUATION COMPLEXITY: Low ?  ?  ?GOALS: ?Goals reviewed with patient? Yes ?  ?SHORT TERM GOALS: ? ?STG Name Target Date Goal status  ?1 Patient  will demonstrate independent use of home exercise program to maintain progress from in clinic treatments.   02/06/2022 Met  ?2 Patient will demonstrate safe independent ambulation community distances  02/06/2022 Met ?As

## 2022-04-22 ENCOUNTER — Encounter: Payer: Self-pay | Admitting: Rehabilitative and Restorative Service Providers"

## 2022-04-22 ENCOUNTER — Ambulatory Visit (INDEPENDENT_AMBULATORY_CARE_PROVIDER_SITE_OTHER): Payer: No Typology Code available for payment source | Admitting: Rehabilitative and Restorative Service Providers"

## 2022-04-22 DIAGNOSIS — R262 Difficulty in walking, not elsewhere classified: Secondary | ICD-10-CM | POA: Diagnosis not present

## 2022-04-22 DIAGNOSIS — M25661 Stiffness of right knee, not elsewhere classified: Secondary | ICD-10-CM | POA: Diagnosis not present

## 2022-04-22 DIAGNOSIS — M25561 Pain in right knee: Secondary | ICD-10-CM

## 2022-04-22 DIAGNOSIS — R600 Localized edema: Secondary | ICD-10-CM

## 2022-04-22 DIAGNOSIS — M6281 Muscle weakness (generalized): Secondary | ICD-10-CM

## 2022-04-22 NOTE — Therapy (Signed)
?OUTPATIENT PHYSICAL THERAPY TREATMENT NOTE /RECERT ? ? ?Patient Name: Leroy Griffith ?MRN: [id number] ?DOB:September 14, 1986, 36 y.o., male ?Today's Date: 04/22/2022 ? ?PCP: Pcp, No ?REFERRING PROVIDER: Cammy Copa, MD ? ?Progress Note ?Reporting Period 03/14/2022 to 04/22/2022 ? ?See note below for Objective Data and Assessment of Progress/Goals.  ? ? ? ? ? PT End of Session - 04/22/22 1031   ? ? Visit Number 24   ? Number of Visits 32   ? Date for PT Re-Evaluation 05/23/22   ? Authorization Type Workers Compensation   ? Authorization Time Period until 5/10   ? Authorization - Visit Number 9   ? Authorization - Number of Visits 10   ? Progress Note Due on Visit 20   ? PT Start Time 1015   ? PT Stop Time 1057   ? PT Time Calculation (min) 42 min   ? Activity Tolerance Patient tolerated treatment well   ? Behavior During Therapy [facility] for tasks assessed/performed   ? ?  ?  ? ?  ? ? ? ? ? ? ? ? ? ?Past Medical History:  ?Diagnosis Date  ? GERD (gastroesophageal reflux disease)   ? Sleep apnea   ? ?Past Surgical History:  ?Procedure Laterality Date  ? ANTERIOR CRUCIATE LIGAMENT REPAIR Right 01/07/2022  ? Procedure: RIGHT KNEE ANTERIOR CRUCIATE LIGAMENT RECONSTRUCTION QUAD AUTOGRAFT, PARTIAL MEDIAL MENISCECTOMY;  Surgeon: Cammy Copa, MD;  Location: [facility];  Service: Orthopedics;  Laterality: Right;  ? ?Patient Active Problem List  ? Diagnosis Date Noted  ? Rupture of anterior cruciate ligament of right knee   ? Acute medial meniscus tear of right knee   ? ? ?REFERRING PROVIDER: Cammy Copa, MD ?  ?REFERRING DIAG: I20.355H (ICD-10-CM) - Rupture of anterior cruciate ligament of right knee, initial encounter ? ?THERAPY DIAG:  ?Acute pain of right knee ? ?Stiffness of right knee, not elsewhere classified ? ?Difficulty in walking, not elsewhere classified ? ?Muscle weakness (generalized) ? ?Localized edema ? ?PERTINENT HISTORY: unremarkable ? ?PRECAUTIONS: No OKC loaded ?  ?WEIGHT BEARING RESTRICTIONS None at this  time.  ? ?SUBJECTIVE: Pt indicated no pain complaints since last visit.  Feeling better with walking/stairs.  ? ? ?PAIN:  ?Are you having pain? No pain at rest ?NPRS scale:0/10 ?Pain location: Rt knee  ?Pain orientation: Right  ?PAIN TYPE: surgery  ?Pain description:  ?Aggravating factors:  ?Relieving factors: ? ? ?OBJECTIVE:   ?PATIENT SURVEYS:  ? 04/16/2022:  FOTO update:  67 ? ?03/14/2022:  FOTO update:  67  ? ?01/16/2022 FOTO intake:  43 (predicted:  75) ?  ?COGNITION: ?         01/16/2022: Overall cognitive status: Within functional limits for tasks assessed                  ?          ?SENSATION: ?         01/16/2022: Light touch: Appears intact ?  ?Edema/Atrophy: ?         01/16/2022: jt measurement Lt: 14.5 inch   Rt: 16.5 inch ?                         Quad 6 inches proximal to superior patellar border Lt: 21 inch    Rt: 20.5 inch ?  ?  ?PALPATION: ?01/16/2022: Mild tenderness around incisions, distal quad insertion ?  ?LE AROM/PROM: ?  ?A/PROM Right ?01/16/2022 Left ?01/16/2022 Right ?01/28/2022 Right ?  02/04/2022 Right ?02/18/2022 Right ?03/14/2022 Rt ?03/25/2022 Right ?04/22/2022  ?Hip flexion            ?Hip extension            ?Hip abduction            ?Hip adduction            ?Hip internal rotation            ?Hip external rotation            ?Knee flexion AROM in supine heel slide: 90 ?  ?PROM in supine heel slide: 93 AROM in supine heel slide: 146 AROM in supine heel slide: 114 degrees AROM in supine ?Heel slide: 115 degrees AROM in supine heel slide 130 degrees AROM in supine heel slide 135  AROM  ?Supine heel slide 136 degrees ? AROM in supine heel slide 138 (soft tissue approximation)  ?Knee extension AROM in LAQ: -50 c pain noted ?PROM in supine heel prop -2 ?  AROM in LAQ:  2 degrees hyperextension ?  AROM in sitting LAQ: -5 degrees AROM in supine ?-5 degrees  AROM 0 deg AROM ?0 degrees AROM 0 degrees  ?Ankle dorsiflexion            ?Ankle plantarflexion            ?Ankle inversion            ?Ankle eversion             ? (Blank rows = not tested) ?  ?LE MMT: ?  ?MMT ?  Right ?01/16/2022 Left ?01/16/2022 Right ?01/28/2022 Right ?02/21/2022 Left ?02/21/2022 Right ?03/07/2022 Right ?03/14/2022 Right ?04/03/2022 Right ?04/22/2022  ?Hip flexion 4/5 5/5         ?Hip extension             ?Hip abduction             ?Hip adduction             ?Hip internal rotation             ?Hip external rotation             ?Knee flexion 2/5 5/5       5/5  ?Knee extension 1+ 5/5 3/5 (no resistance testing today) 4/5 ?36, 33 lbs 5/5 ?91.9, 93.5 lbs  5/5 ?46, 41 lbs 5/5 ?57, 55 lbs 5/5 ?64.5, 69.9 lbs 5/5 ?71, 69 lbs  ?Ankle dorsiflexion 5/5 5/5         ?Ankle plantarflexion             ?Ankle inversion 5/5 5/5         ?Ankle eversion 5/5 5/5         ? (Blank rows = not tested) ?  ?  ?  ?FUNCTIONAL TESTS:  ?04/07/2022:  Y balance testing Lt composite school: 101%, Rt: 95% ? ?03/14/2022:  Rt SLS 30 seconds even ground ? ?02/11/2022:  Rt SLS: 25 seconds ? ?01/28/2022: able to perform SLR x 15 c good control( Requirement noted from MD note for ambulation without immobilizer ? ?01/16/2022:  Rt SLS: unable ?  ?GAIT: ?01/28/2022: Ambulation into clinic without immobilizer, decreased stance on Lt, lacking full TKE in terminal swing ? ?01/20/2022: Immobilizer Rt knee c no crutches noted ? ? ?  ?Today's Treatment:  ?04/22/2022: ? ?BFR   : 196 mmHg, exercise 150 mmHg LOP, cuff size 4 Rt leg (not used today) ? ?Therex:  Recumbent bike lvl 4 8 mins ?  Eccentric decline squat single leg on Rt to tolerance, double leg up x 10, x 15, x 10 ?  Qruped on hands/toes with blue band fire hydrant x 15 bilateral, hip extension x 15 bilateral ?     ?   ?Neuro re-ed:   ?  Y balance light touches each direction x 10 bilateral on foam ?Lateral step down heel touch 6 inch 2 x 10 Rt leg c blue band around knee pulling medially ?   ?  ? ?04/18/2022: ? ?BFR   : 196 mmHg, exercise 150 mmHg LOP, cuff size 4 Rt leg  ? ?Therex:       UBE LE only lvl 4.0 5 mins ?  Split squat Rt leg c BFR 150 mmHg - x 30 (0  lbs), 3 x 15 (c 5 lbs) with 30 sec rest breaks ?  Lateral step down heel touch 6 inch 2 x 10 Rt leg ?  Single RDL 10 lb kettle bell x 15 bilateral slowly  ?   ?Neuro re-ed:   ?   ?  Double leg squat hold on upside down bosu c ball toss 3 x 20 tosses ?  SLS in 15 degrees knee flexion c mild forward trunk lean 10 lb kettle bell swing side to side UE handoffs 30 sec x 5 ?   ?04/16/2022: ? ?BFR   : 196 mmHg, exercise 150 mmHg LOP, cuff size 4 Rt leg (not used today) ? ?Therex:       Reverse ambulation on treadmill incline 3.5 speed 1.8 mph 6 mins  ?  Rt leg single leg press 100 lbs to fatigue x 50 ?  Step up and over/down Rt leg WB c focus on eccentric lowering control x 20 - 6 inch ?  Hamstring curl machine Rt leg only 35 lbs 2 x 15 ?   ?Neuro re-ed:   ?  SLS on upside down bosu 30 sec x 4 bilateral ?  Double leg squat on upside down bosu c 5 sec hold in squat x 15 c ball toss ?  Reactive light command lateral stepping SL loading 2 sec time out 30 sec x 5 bilateral ?  Plymometric reaction Double leg on shuttle TNT 62 lbs c focus on slow lowering upon contact x 30 (cues consistently for improved control focus) ? ?  ? ? ?PATIENT EDUCATION:  ?01/16/2022:  ?Education details: HEP, POC, precautions ?Person educated: Patient ?Education method: Explanation, Demonstration, Verbal cues, and Handouts ?Education comprehension: verbalized understanding and returned demonstration ?  ?  ?HOME EXERCISE PROGRAM: ?Access Code: [email or web address] ? ?  ?  ?ASSESSMENT: ?  ?CLINICAL IMPRESSION: ?Pt has attended 9 of approved 10 visits with expiration date of 04/23/2022.  Pt has reported minimal pain complaints at this time in treatment cycle.  See objective data for updated information showing current progression in strength, mobility and movement coordination function.  Pt has demonstrated consistent progress overall but does still require additional skilled PT services paired c HEP to continue progression to full reach goals as established.  Additional  visits required for continued treatment.  Type of surgery requires additional time and visits to allow progression towards goals.  ? ?  ?REHAB POTENTIAL: Good ?  ?CLINICAL DECISION MAKING: Stable/uncom

## 2022-05-01 ENCOUNTER — Encounter: Payer: Self-pay | Admitting: Rehabilitative and Restorative Service Providers"

## 2022-05-01 ENCOUNTER — Ambulatory Visit (INDEPENDENT_AMBULATORY_CARE_PROVIDER_SITE_OTHER): Payer: PRIVATE HEALTH INSURANCE | Admitting: Rehabilitative and Restorative Service Providers"

## 2022-05-01 DIAGNOSIS — R262 Difficulty in walking, not elsewhere classified: Secondary | ICD-10-CM

## 2022-05-01 DIAGNOSIS — M25561 Pain in right knee: Secondary | ICD-10-CM

## 2022-05-01 DIAGNOSIS — R600 Localized edema: Secondary | ICD-10-CM

## 2022-05-01 DIAGNOSIS — M25661 Stiffness of right knee, not elsewhere classified: Secondary | ICD-10-CM

## 2022-05-01 DIAGNOSIS — M6281 Muscle weakness (generalized): Secondary | ICD-10-CM

## 2022-05-01 NOTE — Therapy (Signed)
OUTPATIENT PHYSICAL THERAPY TREATMENT NOTE /RECERT   Patient Name: Leroy Griffith MRN: [id number] DOB:12/06/1986, 36 y.o., male Today's Date: 05/01/2022  PCP: Merryl Hacker, No REFERRING PROVIDER: Meredith Pel, MD    PT End of Session - 05/01/22 0804     Visit Number 25    Number of Visits 32    Date for PT Re-Evaluation 05/23/22    Authorization Type Workers Compensation    Authorization Time Period --    Authorization - Visit Number 1    Authorization - Number of Visits 8    Progress Note Due on Visit 70    PT Start Time 0800    PT Stop Time 0840    PT Time Calculation (min) 40 min    Activity Tolerance Patient tolerated treatment well    Behavior During Therapy WFL for tasks assessed/performed             Past Medical History:  Diagnosis Date   GERD (gastroesophageal reflux disease)    Sleep apnea    Past Surgical History:  Procedure Laterality Date   ANTERIOR CRUCIATE LIGAMENT REPAIR Right 01/07/2022   Procedure: RIGHT KNEE ANTERIOR CRUCIATE LIGAMENT RECONSTRUCTION QUAD AUTOGRAFT, PARTIAL MEDIAL MENISCECTOMY;  Surgeon: Meredith Pel, MD;  Location: [place];  Service: Orthopedics;  Laterality: Right;   Patient Active Problem List   Diagnosis Date Noted   Rupture of anterior cruciate ligament of right knee    Acute medial meniscus tear of right knee     REFERRING PROVIDER: Meredith Pel, MD   REFERRING DIAG: [phone] (ICD-10-CM) - Rupture of anterior cruciate ligament of right knee, initial encounter  THERAPY DIAG:  Acute pain of right knee  Stiffness of right knee, not elsewhere classified  Difficulty in walking, not elsewhere classified  Muscle weakness (generalized)  Localized edema  PERTINENT HISTORY: unremarkable  PRECAUTIONS: No OKC loaded   WEIGHT BEARING RESTRICTIONS None at this time.   SUBJECTIVE: Pt indicated no changes in symptoms since last visit, no worse.    PAIN:  Are you having pain? No pain at rest NPRS scale:0/10 Pain  location: Rt knee  Pain orientation: Right  PAIN TYPE: surgery  Pain description:  Aggravating factors:  Relieving factors:   OBJECTIVE:   PATIENT SURVEYS:   04/16/2022:  FOTO update:  67  03/14/2022:  FOTO update:  67   01/16/2022 FOTO intake:  43 (predicted:  75)   COGNITION:          01/16/2022: Overall cognitive status: Within functional limits for tasks assessed                            SENSATION:          01/16/2022: Light touch: Appears intact   Edema/Atrophy:          01/16/2022: jt measurement Lt: 14.5 inch   Rt: 16.5 inch                          Quad 6 inches proximal to superior patellar border Lt: 21 inch    Rt: 20.5 inch     PALPATION: 01/16/2022: Mild tenderness around incisions, distal quad insertion   LE AROM/PROM:   A/PROM Right 01/16/2022 Left 01/16/2022 Right 01/28/2022 Right 02/04/2022 Right 02/18/2022 Right 03/14/2022 Rt 03/25/2022 Right 04/22/2022  Hip flexion            Hip extension  Hip abduction            Hip adduction            Hip internal rotation            Hip external rotation            Knee flexion AROM in supine heel slide: 90   PROM in supine heel slide: 93 AROM in supine heel slide: 146 AROM in supine heel slide: 114 degrees AROM in supine Heel slide: 115 degrees AROM in supine heel slide 130 degrees AROM in supine heel slide 135  AROM  Supine heel slide 136 degrees  AROM in supine heel slide 138 (soft tissue approximation)  Knee extension AROM in LAQ: -50 c pain noted PROM in supine heel prop -2   AROM in LAQ:  2 degrees hyperextension   AROM in sitting LAQ: -5 degrees AROM in supine -5 degrees  AROM 0 deg AROM 0 degrees AROM 0 degrees  Ankle dorsiflexion            Ankle plantarflexion            Ankle inversion            Ankle eversion             (Blank rows = not tested)   LE MMT:   MMT   Right 01/16/2022 Left 01/16/2022 Right 01/28/2022 Right 02/21/2022 Left 02/21/2022 Right 03/07/2022 Right 03/14/2022  Right 04/03/2022 Right 04/22/2022  Hip flexion 4/5 5/5         Hip extension             Hip abduction             Hip adduction             Hip internal rotation             Hip external rotation             Knee flexion 2/5 5/5       5/5  Knee extension 1+ 5/5 3/5 (no resistance testing today) 4/5 36, 33 lbs 5/5 91.9, 93.5 lbs  5/5 46, 41 lbs 5/5 57, 55 lbs 5/5 64.5, 69.9 lbs 5/5 71, 69 lbs  Ankle dorsiflexion 5/5 5/5         Ankle plantarflexion             Ankle inversion 5/5 5/5         Ankle eversion 5/5 5/5          (Blank rows = not tested)       FUNCTIONAL TESTS:  05/01/2022: 30 seconds on foam SLS, performed bilaterally  04/07/2022:  Y balance testing Lt composite school: 101%, Rt: 95%  03/14/2022:  Rt SLS 30 seconds even ground  02/11/2022:  Rt SLS: 25 seconds  01/28/2022: able to perform SLR x 15 c good control( Requirement noted from MD note for ambulation without immobilizer  01/16/2022:  Rt SLS: unable   GAIT: 01/28/2022: Ambulation into clinic without immobilizer, decreased stance on Lt, lacking full TKE in terminal swing  01/20/2022: Immobilizer Rt knee c no crutches noted     Today's Treatment:  05/01/2022:  Therex:       Recumbent bike lvl 4 6 mins   8 inch fwd step up on Rt leg c blue band TKE 2 x 15   Split squat c foot in chair 10 lb kettle bell x 15 bilateral   Qruped  on hands/toes with blue band fire hydrant x 15 bilateral, hip extension x 15 bilateral         Neuro re-ed:     SLS on foam c reactive light touching (4 lights anterior lateral, anterior, anterior medial arc) 30 sec x 3 bilateral   Lateral stepping reactive to light (3 lights) 30 sec x 4 bilateral   Quick small double leg fwd/back jumps 30 sec x 4   SLS c soccer roll around x 5 cw, x 5 ccw on Rt leg  04/22/2022:  BFR   : 196 mmHg, exercise 150 mmHg LOP, cuff size 4 Rt leg (not used today)  Therex:       Recumbent bike lvl 4 8 mins   Eccentric decline squat single leg on Rt to  tolerance, double leg up x 10, x 15, x 10   Qruped on hands/toes with blue band fire hydrant x 15 bilateral, hip extension x 15 bilateral         Neuro re-ed:     Y balance light touches each direction x 10 bilateral on foam Lateral step down heel touch 6 inch 2 x 10 Rt leg c blue band around knee pulling medially       04/18/2022:  BFR   : 196 mmHg, exercise 150 mmHg LOP, cuff size 4 Rt leg   Therex:       UBE LE only lvl 4.0 5 mins   Split squat Rt leg c BFR 150 mmHg - x 30 (0 lbs), 3 x 15 (c 5 lbs) with 30 sec rest breaks   Lateral step down heel touch 6 inch 2 x 10 Rt leg   Single RDL 10 lb kettle bell x 15 bilateral slowly     Neuro re-ed:        Double leg squat hold on upside down bosu c ball toss 3 x 20 tosses   SLS in 15 degrees knee flexion c mild forward trunk lean 10 lb kettle bell swing side to side UE handoffs 30 sec x 5     PATIENT EDUCATION:  01/16/2022:  Education details: HEP, POC, precautions Person educated: Patient Education method: Consulting civil engineer, Demonstration, Verbal cues, and Handouts Education comprehension: verbalized understanding and returned demonstration     HOME EXERCISE PROGRAM: Access Code: [email or web address]      ASSESSMENT:   CLINICAL IMPRESSION: Additional 8 visits have been approved (today #1).  Continued progression of movement coordination and dynamic control indicated at this time.  Good performance overall today without any pain irritations noted.     REHAB POTENTIAL: Good   CLINICAL DECISION MAKING: Stable/uncomplicated   EVALUATION COMPLEXITY: Low     GOALS: Goals reviewed with patient? Yes   SHORT TERM GOALS:  STG Name Target Date Goal status  1 Patient will demonstrate independent use of home exercise program to maintain progress from in clinic treatments.   02/06/2022 Met  2 Patient will demonstrate safe independent ambulation community distances  02/06/2022 Met Assessed 01/30/2022                                                  LONG TERM GOALS:    LTG Name Target Date Goal status  1 Patient will demonstrate/report pain at worst less than or equal to 2/10 to facilitate minimal limitation in daily activity secondary to pain  symptoms.  06/17/2022 Revised 04/22/2022  2 Patient will demonstrate independent use of home exercise program to facilitate ability to maintain/progress functional gains from skilled physical therapy services.  06/17/2022 Revised 04/22/2022  3 Patient will demonstrate FOTO outcome > or = 75% to indicated reduced disability due to condition. 06/17/2022 Revised 04/22/2022  4 Patient will demonstrate Rt  knee AROM 0-120 degrees to facilitate ability to perform transfers, sitting, ambulation, stair navigation s restriction due to mobility. 03/27/2022 MET 03/14/2022  5 Patient will demonstrate Rt LE MMT 5/5, within 15% dynamometry readings throughout to facilitate ability to perform usual standing, walking, stairs at PLOF s limitation due to symptoms. 06/17/2022 Revised 04/22/2022  6 Patient will demonstrate bilateral SLS > 30 seconds s deviation.  03/27/2022 MET 03/14/2022  7 Pt will demonstrate ascending/descending stairs reciprocal gait pattern s UE assist for household navigation.    03/27/2022 MET 03/07/2022    PLAN: PT FREQUENCY: 1x/week   PT DURATION: 8 weeks     PLANNED INTERVENTIONS: Therapeutic exercises, Therapeutic activity, Neuro Muscular re-education, Balance training, Gait training, Patient/Family education, Joint mobilization, Stair training, DME instructions, Dry Needling, Electrical stimulation, Cryotherapy, Moist heat, Taping, Ultrasound, Ionotophoresis 36m/ml Dexamethasone, and Manual therapy.  All included unless contraindicated   PLAN FOR NEXT SESSION: No OKC loaded.    Progressive dynamic balance control improvements.    MScot Jun PT, DPT, OCS, ATC 05/01/22  8:39 AM

## 2022-05-09 ENCOUNTER — Encounter: Payer: Self-pay | Admitting: Rehabilitative and Restorative Service Providers"

## 2022-05-09 ENCOUNTER — Ambulatory Visit (INDEPENDENT_AMBULATORY_CARE_PROVIDER_SITE_OTHER): Payer: PRIVATE HEALTH INSURANCE | Admitting: Rehabilitative and Restorative Service Providers"

## 2022-05-09 DIAGNOSIS — M6281 Muscle weakness (generalized): Secondary | ICD-10-CM

## 2022-05-09 DIAGNOSIS — M25661 Stiffness of right knee, not elsewhere classified: Secondary | ICD-10-CM

## 2022-05-09 DIAGNOSIS — M25561 Pain in right knee: Secondary | ICD-10-CM | POA: Diagnosis not present

## 2022-05-09 DIAGNOSIS — R262 Difficulty in walking, not elsewhere classified: Secondary | ICD-10-CM

## 2022-05-09 DIAGNOSIS — R600 Localized edema: Secondary | ICD-10-CM

## 2022-05-09 NOTE — Therapy (Signed)
OUTPATIENT PHYSICAL THERAPY TREATMENT NOTE /RECERT   Patient Name: Leroy Griffith MRN: [id number] DOB:[birth year], 36 y.o., male Today's Date: 05/09/2022  PCP: Merryl Hacker, No REFERRING PROVIDER: Meredith Pel, MD    PT End of Session - 05/09/22 0757     Visit Number 26    Number of Visits 32    Date for PT Re-Evaluation 05/23/22    Authorization Type Workers Compensation    Authorization - Visit Number 2    Authorization - Number of Visits 8    Progress Note Due on Visit 54    PT Start Time 0757    PT Stop Time 0837    PT Time Calculation (min) 40 min    Activity Tolerance Patient tolerated treatment well    Behavior During Therapy WFL for tasks assessed/performed              Past Medical History:  Diagnosis Date   GERD (gastroesophageal reflux disease)    Sleep apnea    Past Surgical History:  Procedure Laterality Date   ANTERIOR CRUCIATE LIGAMENT REPAIR Right 01/07/2022   Procedure: RIGHT KNEE ANTERIOR CRUCIATE LIGAMENT RECONSTRUCTION QUAD AUTOGRAFT, PARTIAL MEDIAL MENISCECTOMY;  Surgeon: Meredith Pel, MD;  Location: [place];  Service: Orthopedics;  Laterality: Right;   Patient Active Problem List   Diagnosis Date Noted   Rupture of anterior cruciate ligament of right knee    Acute medial meniscus tear of right knee     REFERRING PROVIDER: Meredith Pel, MD   REFERRING DIAG: [phone] (ICD-10-CM) - Rupture of anterior cruciate ligament of right knee, initial encounter  THERAPY DIAG:  Acute pain of right knee  Stiffness of right knee, not elsewhere classified  Difficulty in walking, not elsewhere classified  Muscle weakness (generalized)  Localized edema  PERTINENT HISTORY: unremarkable  PRECAUTIONS: No OKC loaded   WEIGHT BEARING RESTRICTIONS None at this time.   SUBJECTIVE:  Pt indicated improvement in going doing stairs , better than before.  Moving closer to normal.     PAIN:  Are you having pain? No pain at rest NPRS  scale:0/10 Pain location: Rt knee  Pain orientation: Right  PAIN TYPE: surgery  Pain description:  Aggravating factors:  Relieving factors:   OBJECTIVE:   PATIENT SURVEYS:   04/16/2022:  FOTO update:  67  03/14/2022:  FOTO update:  67   01/16/2022 FOTO intake:  43 (predicted:  75)   COGNITION:          01/16/2022: Overall cognitive status: Within functional limits for tasks assessed                            SENSATION:          01/16/2022: Light touch: Appears intact   Edema/Atrophy:          01/16/2022: jt measurement Lt: 14.5 inch   Rt: 16.5 inch                          Quad 6 inches proximal to superior patellar border Lt: 21 inch    Rt: 20.5 inch     PALPATION: 01/16/2022: Mild tenderness around incisions, distal quad insertion   LE AROM/PROM:   A/PROM Right 01/16/2022 Left 01/16/2022 Right 01/28/2022 Right 02/04/2022 Right 02/18/2022 Right 03/14/2022 Rt 03/25/2022 Right 04/22/2022  Hip flexion            Hip extension  Hip abduction            Hip adduction            Hip internal rotation            Hip external rotation            Knee flexion AROM in supine heel slide: 90   PROM in supine heel slide: 93 AROM in supine heel slide: 146 AROM in supine heel slide: 114 degrees AROM in supine Heel slide: 115 degrees AROM in supine heel slide 130 degrees AROM in supine heel slide 135  AROM  Supine heel slide 136 degrees  AROM in supine heel slide 138 (soft tissue approximation)  Knee extension AROM in LAQ: -50 c pain noted PROM in supine heel prop -2   AROM in LAQ:  2 degrees hyperextension   AROM in sitting LAQ: -5 degrees AROM in supine -5 degrees  AROM 0 deg AROM 0 degrees AROM 0 degrees  Ankle dorsiflexion            Ankle plantarflexion            Ankle inversion            Ankle eversion             (Blank rows = not tested)   LE MMT:   MMT   Right 01/16/2022 Left 01/16/2022 Right 01/28/2022 Right 02/21/2022 Left 02/21/2022 Right 03/07/2022  Right 03/14/2022 Right 04/03/2022 Right 04/22/2022 Right 05/09/2022  Hip flexion 4/5 5/5          Hip extension              Hip abduction              Hip adduction              Hip internal rotation              Hip external rotation              Knee flexion 2/5 5/5       5/5   Knee extension 1+ 5/5 3/5 (no resistance testing today) 4/5 36, 33 lbs 5/5 91.9, 93.5 lbs  5/5 46, 41 lbs 5/5 57, 55 lbs 5/5 64.5, 69.9 lbs 5/5 71, 69 lbs 5/5   Ankle dorsiflexion 5/5 5/5          Ankle plantarflexion              Ankle inversion 5/5 5/5          Ankle eversion 5/5 5/5           (Blank rows = not tested)       FUNCTIONAL TESTS:  05/01/2022: 30 seconds on foam SLS, performed bilaterally  04/07/2022:  Y balance testing Lt composite school: 101%, Rt: 95%  03/14/2022:  Rt SLS 30 seconds even ground  02/11/2022:  Rt SLS: 25 seconds  01/28/2022: able to perform SLR x 15 c good control( Requirement noted from MD note for ambulation without immobilizer  01/16/2022:  Rt SLS: unable   GAIT: 01/28/2022: Ambulation into clinic without immobilizer, decreased stance on Lt, lacking full TKE in terminal swing  01/20/2022: Immobilizer Rt knee c no crutches noted  Today's Treatment:  05/09/2022:  Therex:       Recumbent bike lvl 5 6 mins   Monster walks blue band around ankles fwd/back 20 ft x 5 each   Plyometric leg press machine Double leg up  jump c slow lowering 75 lbs x 20, Double leg up, Rt leg slow lowering 32 lbs x 20   TRX single leg squat 2 x 15 bilateral        Neuro re-ed:       Quick small double leg fwd/back jumps 30 sec x 4, lateral 30 sec x 4   SLS c soccer roll around x 5 cw, x 5 ccw on bilateral   Soccer ball foot to foot dribbling between feet with hops 15 sec x 4  05/01/2022:  Therex:       Recumbent bike lvl 4 6 mins   8 inch fwd step up on Rt leg c blue band TKE 2 x 15   Split squat c foot in chair 10 lb kettle bell x 15 bilateral   Qruped on hands/toes with blue band fire  hydrant x 15 bilateral, hip extension x 15 bilateral         Neuro re-ed:     SLS on foam c reactive light touching (4 lights anterior lateral, anterior, anterior medial arc) 30 sec x 3 bilateral   Lateral stepping reactive to light (3 lights) 30 sec x 4 bilateral   Quick small double leg fwd/back jumps 30 sec x 4   SLS c soccer roll around x 5 cw, x 5 ccw on Rt leg  04/22/2022:  BFR   : 196 mmHg, exercise 150 mmHg LOP, cuff size 4 Rt leg (not used today)  Therex:       Recumbent bike lvl 4 8 mins   Eccentric decline squat single leg on Rt to tolerance, double leg up x 10, x 15, x 10   Qruped on hands/toes with blue band fire hydrant x 15 bilateral, hip extension x 15 bilateral         Neuro re-ed:     Y balance light touches each direction x 10 bilateral on foam Lateral step down heel touch 6 inch 2 x 10 Rt leg c blue band around knee pulling medially    PATIENT EDUCATION:  01/16/2022:  Education details: HEP, POC, precautions Person educated: Patient Education method: Consulting civil engineer, Demonstration, Verbal cues, and Handouts Education comprehension: verbalized understanding and returned demonstration     HOME EXERCISE PROGRAM: Access Code: [email or web address]      ASSESSMENT:   CLINICAL IMPRESSION: Continue process of improving neuromuscular control, endurance and initiation of plyometric and agility based control interventions.  Good response overall. Continued skilled PT services to help progress towards long term goals and PLOF.     REHAB POTENTIAL: Good   CLINICAL DECISION MAKING: Stable/uncomplicated   EVALUATION COMPLEXITY: Low     GOALS: Goals reviewed with patient? Yes   SHORT TERM GOALS:  STG Name Target Date Goal status  1 Patient will demonstrate independent use of home exercise program to maintain progress from in clinic treatments.   02/06/2022 Met  2 Patient will demonstrate safe independent ambulation community distances  02/06/2022 Met Assessed 01/30/2022                                                  LONG TERM GOALS:    LTG Name Target Date Goal status  1 Patient will demonstrate/report pain at worst less than or equal to 2/10 to facilitate minimal limitation in daily activity secondary to pain symptoms.  06/17/2022 Revised  04/22/2022  2 Patient will demonstrate independent use of home exercise program to facilitate ability to maintain/progress functional gains from skilled physical therapy services.  06/17/2022 Revised 04/22/2022  3 Patient will demonstrate FOTO outcome > or = 75% to indicated reduced disability due to condition. 06/17/2022 Revised 04/22/2022  4 Patient will demonstrate Rt  knee AROM 0-120 degrees to facilitate ability to perform transfers, sitting, ambulation, stair navigation s restriction due to mobility. 03/27/2022 MET 03/14/2022  5 Patient will demonstrate Rt LE MMT 5/5, within 15% dynamometry readings throughout to facilitate ability to perform usual standing, walking, stairs at PLOF s limitation due to symptoms. 06/17/2022 Revised 04/22/2022  6 Patient will demonstrate bilateral SLS > 30 seconds s deviation.  03/27/2022 MET 03/14/2022  7 Pt will demonstrate ascending/descending stairs reciprocal gait pattern s UE assist for household navigation.    03/27/2022 MET 03/07/2022    PLAN: PT FREQUENCY: 1x/week   PT DURATION: 8 weeks    PLANNED INTERVENTIONS: Therapeutic exercises, Therapeutic activity, Neuro Muscular re-education, Balance training, Gait training, Patient/Family education, Joint mobilization, Stair training, DME instructions, Dry Needling, Electrical stimulation, Cryotherapy, Moist heat, Taping, Ultrasound, Ionotophoresis 49m/ml Dexamethasone, and Manual therapy.  All included unless contraindicated   PLAN FOR NEXT SESSION: No OKC loaded.    Progressive dynamic balance control improvements.    MScot Jun PT, DPT, OCS, ATC 05/09/22  8:34 AM

## 2022-05-15 ENCOUNTER — Ambulatory Visit (INDEPENDENT_AMBULATORY_CARE_PROVIDER_SITE_OTHER): Payer: PRIVATE HEALTH INSURANCE | Admitting: Rehabilitative and Restorative Service Providers"

## 2022-05-15 ENCOUNTER — Encounter: Payer: Self-pay | Admitting: Rehabilitative and Restorative Service Providers"

## 2022-05-15 DIAGNOSIS — M6281 Muscle weakness (generalized): Secondary | ICD-10-CM

## 2022-05-15 DIAGNOSIS — M25561 Pain in right knee: Secondary | ICD-10-CM

## 2022-05-15 DIAGNOSIS — R262 Difficulty in walking, not elsewhere classified: Secondary | ICD-10-CM

## 2022-05-15 DIAGNOSIS — M25661 Stiffness of right knee, not elsewhere classified: Secondary | ICD-10-CM

## 2022-05-15 DIAGNOSIS — R600 Localized edema: Secondary | ICD-10-CM

## 2022-05-15 NOTE — Therapy (Signed)
OUTPATIENT PHYSICAL THERAPY TREATMENT NOTE /RECERT   Patient Name: Leroy Griffith MRN: [id number] DOB:[birth year], 36 y.o., male Today's Date: 05/15/2022  PCP: Merryl Hacker, No REFERRING PROVIDER: Meredith Pel, MD    PT End of Session - 05/15/22 0857     Visit Number 27    Number of Visits 32    Date for PT Re-Evaluation 05/23/22    Authorization Type Workers Compensation    Authorization - Visit Number 3    Authorization - Number of Visits 8    Progress Note Due on Visit 62    PT Start Time [phone]    PT Stop Time 0930    PT Time Calculation (min) 38 min    Activity Tolerance Patient tolerated treatment well    Behavior During Therapy WFL for tasks assessed/performed               Past Medical History:  Diagnosis Date   GERD (gastroesophageal reflux disease)    Sleep apnea    Past Surgical History:  Procedure Laterality Date   ANTERIOR CRUCIATE LIGAMENT REPAIR Right 01/07/2022   Procedure: RIGHT KNEE ANTERIOR CRUCIATE LIGAMENT RECONSTRUCTION QUAD AUTOGRAFT, PARTIAL MEDIAL MENISCECTOMY;  Surgeon: Meredith Pel, MD;  Location: [facility];  Service: Orthopedics;  Laterality: Right;   Patient Active Problem List   Diagnosis Date Noted   Rupture of anterior cruciate ligament of right knee    Acute medial meniscus tear of right knee     REFERRING PROVIDER: Meredith Pel, MD   REFERRING DIAG: [phone] (ICD-10-CM) - Rupture of anterior cruciate ligament of right knee, initial encounter  THERAPY DIAG:  Acute pain of right knee  Stiffness of right knee, not elsewhere classified  Difficulty in walking, not elsewhere classified  Muscle weakness (generalized)  Localized edema  PERTINENT HISTORY: unremarkable  PRECAUTIONS: No OKC loaded   WEIGHT BEARING RESTRICTIONS None at this time.   SUBJECTIVE:  Pt indicated improvement in going doing stairs , better than before.  Moving closer to normal.     PAIN:  Are you having pain? No pain at rest NPRS  scale:0/10 Pain location: Rt knee  Pain orientation: Right  PAIN TYPE: surgery  Pain description:  Aggravating factors:  Relieving factors:   OBJECTIVE:   PATIENT SURVEYS:   04/16/2022:  FOTO update:  67  03/14/2022:  FOTO update:  67   01/16/2022 FOTO intake:  43 (predicted:  75)   COGNITION:          01/16/2022: Overall cognitive status: Within functional limits for tasks assessed                            SENSATION:          01/16/2022: Light touch: Appears intact   Edema/Atrophy:          01/16/2022: jt measurement Lt: 14.5 inch   Rt: 16.5 inch                          Quad 6 inches proximal to superior patellar border Lt: 21 inch    Rt: 20.5 inch     PALPATION: 01/16/2022: Mild tenderness around incisions, distal quad insertion   LE AROM/PROM:   A/PROM Right 01/16/2022 Left 01/16/2022 Right 01/28/2022 Right 02/04/2022 Right 02/18/2022 Right 03/14/2022 Rt 03/25/2022 Right 04/22/2022  Hip flexion            Hip extension  Hip abduction            Hip adduction            Hip internal rotation            Hip external rotation            Knee flexion AROM in supine heel slide: 90   PROM in supine heel slide: 93 AROM in supine heel slide: 146 AROM in supine heel slide: 114 degrees AROM in supine Heel slide: 115 degrees AROM in supine heel slide 130 degrees AROM in supine heel slide 135  AROM  Supine heel slide 136 degrees  AROM in supine heel slide 138 (soft tissue approximation)  Knee extension AROM in LAQ: -50 c pain noted PROM in supine heel prop -2   AROM in LAQ:  2 degrees hyperextension   AROM in sitting LAQ: -5 degrees AROM in supine -5 degrees  AROM 0 deg AROM 0 degrees AROM 0 degrees  Ankle dorsiflexion            Ankle plantarflexion            Ankle inversion            Ankle eversion             (Blank rows = not tested)   LE MMT:   MMT   Right 01/16/2022 Left 01/16/2022 Right 01/28/2022 Right 02/21/2022 Left 02/21/2022 Right 03/07/2022  Right 03/14/2022 Right 04/03/2022 Right 04/22/2022 Right 05/09/2022  Hip flexion 4/5 5/5          Hip extension              Hip abduction              Hip adduction              Hip internal rotation              Hip external rotation              Knee flexion 2/5 5/5       5/5   Knee extension 1+ 5/5 3/5 (no resistance testing today) 4/5 36, 33 lbs 5/5 91.9, 93.5 lbs  5/5 46, 41 lbs 5/5 57, 55 lbs 5/5 64.5, 69.9 lbs 5/5 71, 69 lbs 5/5   Ankle dorsiflexion 5/5 5/5          Ankle plantarflexion              Ankle inversion 5/5 5/5          Ankle eversion 5/5 5/5           (Blank rows = not tested)       FUNCTIONAL TESTS:  05/01/2022: 30 seconds on foam SLS, performed bilaterally  04/07/2022:  Y balance testing Lt composite school: 101%, Rt: 95%  03/14/2022:  Rt SLS 30 seconds even ground  02/11/2022:  Rt SLS: 25 seconds  01/28/2022: able to perform SLR x 15 c good control( Requirement noted from MD note for ambulation without immobilizer  01/16/2022:  Rt SLS: unable   GAIT: 01/28/2022: Ambulation into clinic without immobilizer, decreased stance on Lt, lacking full TKE in terminal swing  01/20/2022: Immobilizer Rt knee c no crutches noted  Today's Treatment:  05/15/2022:  Therex:       Recumbent bike lvl 5 5 mins   Monster walks blue band around ankles fwd/back 20 ft x 5 each   Plyometric leg press machine Double leg up  jump c slow lowering 87 lbs x 20, Double leg up, Rt leg slow lowering 37 lbs x 20   Split squat on foam pad x 20 bilateral   Soccer kick x 20 Rt leg      Lateral step down eccentric focus 6 inch x 20 Neuro re-ed:       Soccer ball foot to foot dribbling between feet with hops 15 sec x 4   Square agility fwd/lateral/reverse fast walking/shuffling x 5 each way around square   Bosu ball step up c contralateral leg march x 20 slow control focus bilateral  05/09/2022:  Therex:       Recumbent bike lvl 5 6 mins   Monster walks blue band around ankles fwd/back 20  ft x 5 each   Plyometric leg press machine Double leg up jump c slow lowering 75 lbs x 20, Double leg up, Rt leg slow lowering 32 lbs x 20   TRX single leg squat 2 x 15 bilateral        Neuro re-ed:       Quick small double leg fwd/back jumps 30 sec x 4, lateral 30 sec x 4   SLS c soccer roll around x 5 cw, x 5 ccw on bilateral   Soccer ball foot to foot dribbling between feet with hops 20 sec x 5  05/01/2022:  Therex:       Recumbent bike lvl 4 6 mins   8 inch fwd step up on Rt leg c blue band TKE 2 x 15   Split squat c foot in chair 10 lb kettle bell x 15 bilateral   Qruped on hands/toes with blue band fire hydrant x 15 bilateral, hip extension x 15 bilateral         Neuro re-ed:     SLS on foam c reactive light touching (4 lights anterior lateral, anterior, anterior medial arc) 30 sec x 3 bilateral   Lateral stepping reactive to light (3 lights) 30 sec x 4 bilateral   Quick small double leg fwd/back jumps 30 sec x 4   SLS c soccer roll around x 5 cw, x 5 ccw on Rt leg   PATIENT EDUCATION:  01/16/2022:  Education details: HEP, POC, precautions Person educated: Patient Education method: Consulting civil engineer, Demonstration, Verbal cues, and Handouts Education comprehension: verbalized understanding and returned demonstration     HOME EXERCISE PROGRAM: Access Code: [email or web address]      ASSESSMENT:   CLINICAL IMPRESSION: Continue steady progression and good overall performance on plyometric and early loading/changing of direction activity in last week or two.  Rt leg control still lacking compared to Lt in visual observation (increased aberrant movmeent, muscle fatigue).     REHAB POTENTIAL: Good   CLINICAL DECISION MAKING: Stable/uncomplicated   EVALUATION COMPLEXITY: Low     GOALS: Goals reviewed with patient? Yes   SHORT TERM GOALS:  STG Name Target Date Goal status  1 Patient will demonstrate independent use of home exercise program to maintain progress from in clinic  treatments.   02/06/2022 Met  2 Patient will demonstrate safe independent ambulation community distances  02/06/2022 Met Assessed 01/30/2022                                                 LONG TERM GOALS:    LTG Name Target Date Goal status  1 Patient will demonstrate/report pain at worst less than or equal to 2/10 to facilitate minimal limitation in daily activity secondary to pain symptoms.  06/17/2022 Revised 04/22/2022  2 Patient will demonstrate independent use of home exercise program to facilitate ability to maintain/progress functional gains from skilled physical therapy services.  06/17/2022 Revised 04/22/2022  3 Patient will demonstrate FOTO outcome > or = 75% to indicated reduced disability due to condition. 06/17/2022 Revised 04/22/2022  4 Patient will demonstrate Rt  knee AROM 0-120 degrees to facilitate ability to perform transfers, sitting, ambulation, stair navigation s restriction due to mobility. 03/27/2022 MET 03/14/2022  5 Patient will demonstrate Rt LE MMT 5/5, within 15% dynamometry readings throughout to facilitate ability to perform usual standing, walking, stairs at PLOF s limitation due to symptoms. 06/17/2022 Revised 04/22/2022  6 Patient will demonstrate bilateral SLS > 30 seconds s deviation.  03/27/2022 MET 03/14/2022  7 Pt will demonstrate ascending/descending stairs reciprocal gait pattern s UE assist for household navigation.    03/27/2022 MET 03/07/2022    PLAN: PT FREQUENCY: 1x/week   PT DURATION: 8 weeks    PLANNED INTERVENTIONS: Therapeutic exercises, Therapeutic activity, Neuro Muscular re-education, Balance training, Gait training, Patient/Family education, Joint mobilization, Stair training, DME instructions, Dry Needling, Electrical stimulation, Cryotherapy, Moist heat, Taping, Ultrasound, Ionotophoresis 3m/ml Dexamethasone, and Manual therapy.  All included unless contraindicated   PLAN FOR NEXT SESSION: Progressive dynamic balance control improvements.   LTG  and extension of chart POC date to allow for rest of 8 approved visits.  No OKC leg extension machine per MD   MScot Jun PT, DPT, OCS, ATC 05/15/22  9:29 AM

## 2022-05-20 ENCOUNTER — Encounter: Payer: No Typology Code available for payment source | Admitting: Rehabilitative and Restorative Service Providers"

## 2022-05-28 ENCOUNTER — Encounter: Payer: Self-pay | Admitting: Rehabilitative and Restorative Service Providers"

## 2022-05-28 ENCOUNTER — Ambulatory Visit (INDEPENDENT_AMBULATORY_CARE_PROVIDER_SITE_OTHER): Payer: PRIVATE HEALTH INSURANCE | Admitting: Rehabilitative and Restorative Service Providers"

## 2022-05-28 DIAGNOSIS — R262 Difficulty in walking, not elsewhere classified: Secondary | ICD-10-CM

## 2022-05-28 DIAGNOSIS — R600 Localized edema: Secondary | ICD-10-CM

## 2022-05-28 DIAGNOSIS — M25561 Pain in right knee: Secondary | ICD-10-CM

## 2022-05-28 DIAGNOSIS — M25661 Stiffness of right knee, not elsewhere classified: Secondary | ICD-10-CM

## 2022-05-28 DIAGNOSIS — M6281 Muscle weakness (generalized): Secondary | ICD-10-CM | POA: Diagnosis not present

## 2022-05-28 NOTE — Therapy (Signed)
OUTPATIENT PHYSICAL THERAPY TREATMENT NOTE /RECERT  Patient Name: Leroy Griffith MRN: [id number] DOB:September 21, 1986, 36 y.o., male Today's Date: 05/28/2022  PCP: Merryl Hacker, No REFERRING PROVIDER: Meredith Pel, MD  Progress Note Reporting Period 04/22/2022 to 05/28/2022  See note below for Objective Data and Assessment of Progress/Goals.       PT End of Session - 05/28/22 0808     Visit Number 28    Number of Visits 32    Date for PT Re-Evaluation 07/09/22    Authorization Type Workers Compensation    Authorization - Visit Number 4    Authorization - Number of Visits 8    Progress Note Due on Visit 83    PT Start Time 0803    PT Stop Time 0843    PT Time Calculation (min) 40 min    Activity Tolerance Patient tolerated treatment well    Behavior During Therapy WFL for tasks assessed/performed                Past Medical History:  Diagnosis Date   GERD (gastroesophageal reflux disease)    Sleep apnea    Past Surgical History:  Procedure Laterality Date   ANTERIOR CRUCIATE LIGAMENT REPAIR Right 01/07/2022   Procedure: RIGHT KNEE ANTERIOR CRUCIATE LIGAMENT RECONSTRUCTION QUAD AUTOGRAFT, PARTIAL MEDIAL MENISCECTOMY;  Surgeon: Meredith Pel, MD;  Location: [facility];  Service: Orthopedics;  Laterality: Right;   Patient Active Problem List   Diagnosis Date Noted   Rupture of anterior cruciate ligament of right knee    Acute medial meniscus tear of right knee     REFERRING PROVIDER: Meredith Pel, MD   REFERRING DIAG: [phone] (ICD-10-CM) - Rupture of anterior cruciate ligament of right knee, initial encounter  THERAPY DIAG:  Acute pain of right knee  Stiffness of right knee, not elsewhere classified  Difficulty in walking, not elsewhere classified  Muscle weakness (generalized)  Localized edema  PERTINENT HISTORY: unremarkable  PRECAUTIONS: No OKC loaded   WEIGHT BEARING RESTRICTIONS None at this time.   SUBJECTIVE:  Pt indicated no new complaints.   Continued increase in recreational activity with kids.  Mentioned stairs going down keeps getting better over time.    PAIN:  Are you having pain? No pain at rest NPRS scale:0/10 Pain location: Rt knee  Pain orientation: Right  PAIN TYPE: surgery  Pain description:  Aggravating factors:  Relieving factors:   OBJECTIVE:   PATIENT SURVEYS:   05/28/2022: FOTO update 70  04/16/2022:  FOTO update:  67  03/14/2022:  FOTO update:  67   01/16/2022 FOTO intake:  43 (predicted:  75)   COGNITION:          01/16/2022: Overall cognitive status: Within functional limits for tasks assessed                            SENSATION:          01/16/2022: [place] touch: Appears intact   Edema/Atrophy: 05/28/2022:   Quad 6 inches proximal to superior patellar border  Rt: 20.5 inch          01/16/2022: jt measurement Lt: 14.5 inch   Rt: 16.5 inch                          Quad 6 inches proximal to superior patellar border Lt: 21 inch    Rt: 20.5 inch     PALPATION: 01/16/2022: Mild tenderness around  incisions, distal quad insertion   LE AROM/PROM:   A/PROM Right 01/16/2022 Left 01/16/2022 Right 01/28/2022 Right 02/04/2022 Right 02/18/2022 Right 03/14/2022 Rt 03/25/2022 Right 04/22/2022  Hip flexion            Hip extension            Hip abduction            Hip adduction            Hip internal rotation            Hip external rotation            Knee flexion AROM in supine heel slide: 90   PROM in supine heel slide: 93 AROM in supine heel slide: 146 AROM in supine heel slide: 114 degrees AROM in supine Heel slide: 115 degrees AROM in supine heel slide 130 degrees AROM in supine heel slide 135  AROM  Supine heel slide 136 degrees  AROM in supine heel slide 138 (soft tissue approximation)  Knee extension AROM in LAQ: -50 c pain noted PROM in supine heel prop -2   AROM in LAQ:  2 degrees hyperextension   AROM in sitting LAQ: -5 degrees AROM in supine -5 degrees  AROM 0 deg AROM 0 degrees AROM 0 degrees   Ankle dorsiflexion            Ankle plantarflexion            Ankle inversion            Ankle eversion             (Blank rows = not tested)   LE MMT:   MMT   Right 01/16/2022 Left 01/16/2022 Right 01/28/2022 Right 02/21/2022 Left 02/21/2022 Right 03/07/2022 Right 03/14/2022 Right 04/03/2022 Right 04/22/2022 Right 05/09/2022 Right 05/28/2022  Hip flexion 4/5 5/5           Hip extension               Hip abduction               Hip adduction               Hip internal rotation               Hip external rotation               Knee flexion 2/5 5/5       5/5    Knee extension 1+ 5/5 3/5 (no resistance testing today) 4/5 36, 33 lbs 5/5 91.9, 93.5 lbs  5/5 46, 41 lbs 5/5 57, 55 lbs 5/5 64.5, 69.9 lbs 5/5 71, 69 lbs 5/5  5/5 74.9, 70 lbs  Ankle dorsiflexion 5/5 5/5           Ankle plantarflexion               Ankle inversion 5/5 5/5           Ankle eversion 5/5 5/5            (Blank rows = not tested)       FUNCTIONAL TESTS:  05/01/2022: 30 seconds on foam SLS, performed bilaterally  04/07/2022:  Y balance testing Lt composite school: 101%, Rt: 95%  03/14/2022:  Rt SLS 30 seconds even ground  02/11/2022:  Rt SLS: 25 seconds  01/28/2022: able to perform SLR x 15 c good control( Requirement noted from MD note for ambulation without immobilizer  01/16/2022:  Rt SLS:  unable   GAIT: 01/28/2022: Ambulation into clinic without immobilizer, decreased stance on Lt, lacking full TKE in terminal swing  01/20/2022: Immobilizer Rt knee c no crutches noted  Today's Treatment:  05/28/2022: Therex:       Recumbent bike lvl 5 6 mins   Split squat 12 lbs 20x bilateral   Lateral step down 6 inch c 10 lb kettle bell 2 x 15 bilateral Neuro re-ed:       6 inch jump off landing c quick lateral push off movement x 15 bilateral  Scissor quick feet 20 sec x 4   Star shaped light reaction pods 30 sec x 5 multidirectional movements to touch   SLS c ball circles with contralateral leg 5 x cw, ccw  bilateral     05/15/2022:  Therex:       Recumbent bike lvl 5 5 mins   Monster walks blue band around ankles fwd/back 20 ft x 5 each   Plyometric leg press machine Double leg up jump c slow lowering 87 lbs x 20, Double leg up, Rt leg slow lowering 37 lbs x 20   Split squat on foam pad x 20 bilateral   Soccer kick x 20 Rt leg      Lateral step down eccentric focus 6 inch x 20 Neuro re-ed:       Soccer ball foot to foot dribbling between feet with hops 15 sec x 4   Square agility fwd/lateral/reverse fast walking/shuffling x 5 each way around square   Bosu ball step up c contralateral leg march x 20 slow control focus bilateral  05/09/2022:  Therex:       Recumbent bike lvl 5 6 mins   Monster walks blue band around ankles fwd/back 20 ft x 5 each   Plyometric leg press machine Double leg up jump c slow lowering 75 lbs x 20, Double leg up, Rt leg slow lowering 32 lbs x 20   TRX single leg squat 2 x 15 bilateral        Neuro re-ed:       Quick small double leg fwd/back jumps 30 sec x 4, lateral 30 sec x 4   SLS c soccer roll around x 5 cw, x 5 ccw on bilateral   Soccer ball foot to foot dribbling between feet with hops 20 sec x 5   PATIENT EDUCATION:  01/16/2022:  Education details: HEP, POC, precautions Person educated: Patient Education method: Consulting civil engineer, Demonstration, Verbal cues, and Handouts Education comprehension: verbalized understanding and returned demonstration     HOME EXERCISE PROGRAM: Access Code: [email or web address]  ASSESSMENT:   CLINICAL IMPRESSION: Pt has attended 4 of 8 remaining approved visits.  See objective data for continued update on current presentation.  Appropriate for continued skilled PT services to progress towards PLOF in recreational activity.  Continued improvements in strength, movement coordination and agility based movement indicated at this time.     REHAB POTENTIAL: Good   CLINICAL DECISION MAKING: Stable/uncomplicated   EVALUATION COMPLEXITY:  Low     GOALS: Goals reviewed with patient? Yes   SHORT TERM GOALS:  STG Name Target Date Goal status  1 Patient will demonstrate independent use of home exercise program to maintain progress from in clinic treatments.   02/06/2022 Met  2 Patient will demonstrate safe independent ambulation community distances  02/06/2022 Met Assessed 01/30/2022  LONG TERM GOALS:    LTG Name Target Date Goal status  1 Patient will demonstrate/report pain at worst less than or equal to 2/10 to facilitate minimal limitation in daily activity secondary to pain symptoms.  07/09/2022 Revised 05/28/2022  2 Patient will demonstrate independent use of home exercise program to facilitate ability to maintain/progress functional gains from skilled physical therapy services.  07/09/2022 Revised 05/28/2022  3 Patient will demonstrate FOTO outcome > or = 75% to indicated reduced disability due to condition. 07/09/2022 Revised 05/28/2022  4 Patient will demonstrate Rt  knee AROM 0-120 degrees to facilitate ability to perform transfers, sitting, ambulation, stair navigation s restriction due to mobility. 03/27/2022 MET 03/14/2022  5 Patient will demonstrate Rt LE MMT 5/5, within 15% dynamometry readings throughout to facilitate ability to perform usual standing, walking, stairs at PLOF s limitation due to symptoms. 07/09/2022 Revised 05/28/2022  6 Patient will demonstrate bilateral SLS > 30 seconds s deviation.  03/27/2022 MET 03/14/2022  7 Pt will demonstrate ascending/descending stairs reciprocal gait pattern s UE assist for household navigation.    03/27/2022 MET 03/07/2022    PLAN: PT FREQUENCY: 1x/week   PT DURATION: 8 weeks    PLANNED INTERVENTIONS: Therapeutic exercises, Therapeutic activity, Neuro Muscular re-education, Balance training, Gait training, Patient/Family education, Joint mobilization, Stair training, DME instructions, Dry Needling, Electrical stimulation,  Cryotherapy, Moist heat, Taping, Ultrasound, Ionotophoresis 61m/ml Dexamethasone, and Manual therapy.  All included unless contraindicated   PLAN FOR NEXT SESSION: Continued strengthening, dynamic movement progression.   No OKC leg extension machine per MD   MScot Jun PT, DPT, OCS, ATC 05/28/22  8:41 AM

## 2022-06-11 ENCOUNTER — Encounter: Payer: Self-pay | Admitting: Rehabilitative and Restorative Service Providers"

## 2022-06-11 ENCOUNTER — Ambulatory Visit (INDEPENDENT_AMBULATORY_CARE_PROVIDER_SITE_OTHER): Payer: PRIVATE HEALTH INSURANCE | Admitting: Rehabilitative and Restorative Service Providers"

## 2022-06-11 DIAGNOSIS — M25661 Stiffness of right knee, not elsewhere classified: Secondary | ICD-10-CM | POA: Diagnosis not present

## 2022-06-11 DIAGNOSIS — M25561 Pain in right knee: Secondary | ICD-10-CM | POA: Diagnosis not present

## 2022-06-11 DIAGNOSIS — R600 Localized edema: Secondary | ICD-10-CM

## 2022-06-11 DIAGNOSIS — M6281 Muscle weakness (generalized): Secondary | ICD-10-CM | POA: Diagnosis not present

## 2022-06-11 DIAGNOSIS — R262 Difficulty in walking, not elsewhere classified: Secondary | ICD-10-CM | POA: Diagnosis not present

## 2022-06-11 NOTE — Therapy (Signed)
OUTPATIENT PHYSICAL THERAPY TREATMENT NOTE  Patient Name: Leroy Griffith MRN: [id number] DOB:May 14, 1986, 36 y.o., male Today's Date: 06/11/2022  PCP: Merryl Hacker, No REFERRING PROVIDER: Meredith Pel, MD  Progress Note Reporting Period 04/22/2022 to 05/28/2022  See note below for Objective Data and Assessment of Progress/Goals.       PT End of Session - 06/11/22 0803     Visit Number 29    Number of Visits 32    Date for PT Re-Evaluation 07/09/22    Authorization Type Workers Compensation    Authorization - Visit Number 5    Authorization - Number of Visits 8    Progress Note Due on Visit 63    PT Start Time 0758    PT Stop Time [phone]    PT Time Calculation (min) 40 min    Activity Tolerance Patient tolerated treatment well    Behavior During Therapy WFL for tasks assessed/performed                 Past Medical History:  Diagnosis Date   GERD (gastroesophageal reflux disease)    Sleep apnea    Past Surgical History:  Procedure Laterality Date   ANTERIOR CRUCIATE LIGAMENT REPAIR Right 01/07/2022   Procedure: RIGHT KNEE ANTERIOR CRUCIATE LIGAMENT RECONSTRUCTION QUAD AUTOGRAFT, PARTIAL MEDIAL MENISCECTOMY;  Surgeon: Meredith Pel, MD;  Location: [place];  Service: Orthopedics;  Laterality: Right;   Patient Active Problem List   Diagnosis Date Noted   Rupture of anterior cruciate ligament of right knee    Acute medial meniscus tear of right knee     REFERRING PROVIDER: Meredith Pel, MD   REFERRING DIAG: [id number] (ICD-10-CM) - Rupture of anterior cruciate ligament of right knee, initial encounter  THERAPY DIAG:  Acute pain of right knee  Stiffness of right knee, not elsewhere classified  Difficulty in walking, not elsewhere classified  Muscle weakness (generalized)  Localized edema  PERTINENT HISTORY: unremarkable  PRECAUTIONS: No OKC loaded   WEIGHT BEARING RESTRICTIONS None at this time.   SUBJECTIVE:  Pt reported no pain complaints upon  arrival.    PAIN:  Are you having pain? No pain at rest NPRS scale:0/10 Pain location: Rt knee  Pain orientation: Right  PAIN TYPE: surgery  Pain description:  Aggravating factors:  Relieving factors:   OBJECTIVE:   PATIENT SURVEYS:   05/28/2022: FOTO update 70  04/16/2022:  FOTO update:  67  03/14/2022:  FOTO update:  67   01/16/2022 FOTO intake:  43 (predicted:  75)   COGNITION:          01/16/2022: Overall cognitive status: Within functional limits for tasks assessed                            SENSATION:          01/16/2022: Sunday Corn touch: Appears intact   Edema/Atrophy: 05/28/2022:   Quad 6 inches proximal to superior patellar border  Rt: 20.5 inch          01/16/2022: jt measurement Lt: 14.5 inch   Rt: 16.5 inch                          Quad 6 inches proximal to superior patellar border Lt: 21 inch    Rt: 20.5 inch     PALPATION: 01/16/2022: Mild tenderness around incisions, distal quad insertion   LE AROM/PROM:   A/PROM Right 01/16/2022 Left 01/16/2022 Right  01/28/2022 Right 02/04/2022 Right 02/18/2022 Right 03/14/2022 Rt 03/25/2022 Right 04/22/2022  Hip flexion            Hip extension            Hip abduction            Hip adduction            Hip internal rotation            Hip external rotation            Knee flexion AROM in supine heel slide: 90   PROM in supine heel slide: 93 AROM in supine heel slide: 146 AROM in supine heel slide: 114 degrees AROM in supine Heel slide: 115 degrees AROM in supine heel slide 130 degrees AROM in supine heel slide 135  AROM  Supine heel slide 136 degrees  AROM in supine heel slide 138 (soft tissue approximation)  Knee extension AROM in LAQ: -50 c pain noted PROM in supine heel prop -2   AROM in LAQ:  2 degrees hyperextension   AROM in sitting LAQ: -5 degrees AROM in supine -5 degrees  AROM 0 deg AROM 0 degrees AROM 0 degrees  Ankle dorsiflexion            Ankle plantarflexion            Ankle inversion            Ankle eversion              (Blank rows = not tested)   LE MMT:   MMT   Right 01/16/2022 Left 01/16/2022 Right 01/28/2022 Right 02/21/2022 Left 02/21/2022 Right 03/07/2022 Right 03/14/2022 Right 04/03/2022 Right 04/22/2022 Right 05/09/2022 Right 05/28/2022  Hip flexion 4/5 5/5           Hip extension               Hip abduction               Hip adduction               Hip internal rotation               Hip external rotation               Knee flexion 2/5 5/5       5/5    Knee extension 1+ 5/5 3/5 (no resistance testing today) 4/5 36, 33 lbs 5/5 91.9, 93.5 lbs  5/5 46, 41 lbs 5/5 57, 55 lbs 5/5 64.5, 69.9 lbs 5/5 71, 69 lbs 5/5  5/5 74.9, 70 lbs  Ankle dorsiflexion 5/5 5/5           Ankle plantarflexion               Ankle inversion 5/5 5/5           Ankle eversion 5/5 5/5            (Blank rows = not tested)       FUNCTIONAL TESTS:  06/11/2022:  Eccentric lateral step down 8 inch:  Rt demonstrated increased lateral/medial displacement in loading c mild decrease in control of descent compared to Lt.   05/01/2022: 30 seconds on foam SLS, performed bilaterally  04/07/2022:  Y balance testing Lt composite school: 101%, Rt: 95%  03/14/2022:  Rt SLS 30 seconds even ground  02/11/2022:  Rt SLS: 25 seconds  01/28/2022: able to perform SLR x 15 c good control( Requirement noted  from MD note for ambulation without immobilizer  01/16/2022:  Rt SLS: unable   GAIT: 01/28/2022: Ambulation into clinic without immobilizer, decreased stance on Lt, lacking full TKE in terminal swing  01/20/2022: Immobilizer Rt knee c no crutches noted  Today's Treatment:  06/11/2022: Therex:       Recumbent bike lvl 5 6 mins   RDL x20 10 lbs bilateral   Lateral step down 8 inch c TKE blue band 2 x 15 bilateral   Walking fwd/reverse lunges 20 ft x 4 each way c trunk rotation 5 lb kettle bell  Neuro re-ed:       Blazepod reactive 5 point stair directional changes 30 sec x 5 (15 second breaks)   Blazepod shuttle drill  reactive light 4 pods 30 seconds x 5 (15 second breaks)     05/28/2022: Therex:       Recumbent bike lvl 5 6 mins   Split squat 12 lbs 20x bilateral   Lateral step down 6 inch c 10 lb kettle bell 2 x 15 bilateral Neuro re-ed:       6 inch jump off landing c quick lateral push off movement x 15 bilateral  Scissor quick feet 20 sec x 4   Star shaped light reaction pods 30 sec x 5 multidirectional movements to touch   SLS c ball circles with contralateral leg 5 x cw, ccw bilateral     05/15/2022:  Therex:       Recumbent bike lvl 5 5 mins   Monster walks blue band around ankles fwd/back 20 ft x 5 each   Plyometric leg press machine Double leg up jump c slow lowering 87 lbs x 20, Double leg up, Rt leg slow lowering 37 lbs x 20   Split squat on foam pad x 20 bilateral   Soccer kick x 20 Rt leg      Lateral step down eccentric focus 6 inch x 20 Neuro re-ed:       Soccer ball foot to foot dribbling between feet with hops 15 sec x 4   Square agility fwd/lateral/reverse fast walking/shuffling x 5 each way around square   Bosu ball step up c contralateral leg march x 20 slow control focus bilateral   PATIENT EDUCATION:  01/16/2022:  Education details: HEP, POC, precautions Person educated: Patient Education method: Consulting civil engineer, Demonstration, Verbal cues, and Handouts Education comprehension: verbalized understanding and returned demonstration     HOME EXERCISE PROGRAM: Access Code: [email or web address]  ASSESSMENT:   CLINICAL IMPRESSION: Improving lateral movement control and speed noted in reactive neuro re-ed interventions.  Rt leg strength in loaded WB eccentrics still showing fatigue compared to Lt.     REHAB POTENTIAL: Good   CLINICAL DECISION MAKING: Stable/uncomplicated   EVALUATION COMPLEXITY: Low     GOALS: Goals reviewed with patient? Yes   SHORT TERM GOALS:  STG Name Target Date Goal status  1 Patient will demonstrate independent use of home exercise program to maintain  progress from in clinic treatments.   02/06/2022 Met  2 Patient will demonstrate safe independent ambulation community distances  02/06/2022 Met Assessed 01/30/2022                                                 LONG TERM GOALS:    LTG Name Target Date Goal status  1 Patient will demonstrate/report pain  at worst less than or equal to 2/10 to facilitate minimal limitation in daily activity secondary to pain symptoms.  07/09/2022 On going  06/11/2022  2 Patient will demonstrate independent use of home exercise program to facilitate ability to maintain/progress functional gains from skilled physical therapy services.  07/09/2022 On going  06/11/2022  3 Patient will demonstrate FOTO outcome > or = 75% to indicated reduced disability due to condition. 07/09/2022 On going  06/11/2022  4 Patient will demonstrate Rt  knee AROM 0-120 degrees to facilitate ability to perform transfers, sitting, ambulation, stair navigation s restriction due to mobility. 03/27/2022 MET 03/14/2022  5 Patient will demonstrate Rt LE MMT 5/5, within 15% dynamometry readings throughout to facilitate ability to perform usual standing, walking, stairs at PLOF s limitation due to symptoms. 07/09/2022 On going  06/11/2022  6 Patient will demonstrate bilateral SLS > 30 seconds s deviation.  03/27/2022 MET 03/14/2022  7 Pt will demonstrate ascending/descending stairs reciprocal gait pattern s UE assist for household navigation.    03/27/2022 MET 03/07/2022    PLAN: PT FREQUENCY: 1x/week   PT DURATION: 8 weeks    PLANNED INTERVENTIONS: Therapeutic exercises, Therapeutic activity, Neuro Muscular re-education, Balance training, Gait training, Patient/Family education, Joint mobilization, Stair training, DME instructions, Dry Needling, Electrical stimulation, Cryotherapy, Moist heat, Taping, Ultrasound, Ionotophoresis 54m/ml Dexamethasone, and Manual therapy.  All included unless contraindicated   PLAN FOR NEXT SESSION: Continued  strengthening, dynamic movement progression for improved in control.   No OKC leg extension machine per MD   MScot Jun PT, DPT, OCS, ATC 06/11/22  8:36 AM

## 2022-07-01 ENCOUNTER — Ambulatory Visit (INDEPENDENT_AMBULATORY_CARE_PROVIDER_SITE_OTHER): Payer: PRIVATE HEALTH INSURANCE | Admitting: Rehabilitative and Restorative Service Providers"

## 2022-07-01 ENCOUNTER — Encounter: Payer: Self-pay | Admitting: Rehabilitative and Restorative Service Providers"

## 2022-07-01 DIAGNOSIS — M6281 Muscle weakness (generalized): Secondary | ICD-10-CM

## 2022-07-01 DIAGNOSIS — R262 Difficulty in walking, not elsewhere classified: Secondary | ICD-10-CM

## 2022-07-01 DIAGNOSIS — M25661 Stiffness of right knee, not elsewhere classified: Secondary | ICD-10-CM | POA: Diagnosis not present

## 2022-07-01 DIAGNOSIS — M25561 Pain in right knee: Secondary | ICD-10-CM

## 2022-07-01 DIAGNOSIS — R600 Localized edema: Secondary | ICD-10-CM

## 2022-07-01 NOTE — Therapy (Signed)
OUTPATIENT PHYSICAL THERAPY TREATMENT NOTE  Patient Name: Leroy Griffith MRN: [id number] DOB:1986-07-06, 36 y.o., male Today's Date: 07/01/2022  PCP: Pcp, No REFERRING PROVIDER: No ref. provider found  Progress Note Reporting Period 04/22/2022 to 05/28/2022  See note below for Objective Data and Assessment of Progress/Goals.       PT End of Session - 07/01/22 0932     Visit Number 30    Number of Visits 32    Date for PT Re-Evaluation 07/09/22    Authorization Type [employer] - Visit Number 6    Authorization - Number of Visits 8    Progress Note Due on Visit 36    PT Start Time 0930    PT Stop Time 1010    PT Time Calculation (min) 40 min    Activity Tolerance Patient tolerated treatment well    Behavior During Therapy WFL for tasks assessed/performed                  Past Medical History:  Diagnosis Date   GERD (gastroesophageal reflux disease)    Sleep apnea    Past Surgical History:  Procedure Laterality Date   ANTERIOR CRUCIATE LIGAMENT REPAIR Right 01/07/2022   Procedure: RIGHT KNEE ANTERIOR CRUCIATE LIGAMENT RECONSTRUCTION QUAD AUTOGRAFT, PARTIAL MEDIAL MENISCECTOMY;  Surgeon: Meredith Pel, MD;  Location: [place];  Service: Orthopedics;  Laterality: Right;   Patient Active Problem List   Diagnosis Date Noted   Rupture of anterior cruciate ligament of right knee    Acute medial meniscus tear of right knee     REFERRING PROVIDER: Meredith Pel, MD   REFERRING DIAG: [id number] (ICD-10-CM) - Rupture of anterior cruciate ligament of right knee, initial encounter  THERAPY DIAG:  Acute pain of right knee  Stiffness of right knee, not elsewhere classified  Difficulty in walking, not elsewhere classified  Muscle weakness (generalized)  Localized edema  PERTINENT HISTORY: unremarkable  PRECAUTIONS: No OKC loaded   WEIGHT BEARING RESTRICTIONS None at this time.   SUBJECTIVE:  Pt reported no pain complaints upon  arrival.    PAIN:  Are you having pain? No pain at rest NPRS scale:0/10 Pain location: Rt knee  Pain orientation: Right  PAIN TYPE: surgery  Pain description:  Aggravating factors:  Relieving factors:   OBJECTIVE:   PATIENT SURVEYS:   05/28/2022: FOTO update 70  04/16/2022:  FOTO update:  67  03/14/2022:  FOTO update:  67   01/16/2022 FOTO intake:  43 (predicted:  75)   COGNITION:          01/16/2022: Overall cognitive status: Within functional limits for tasks assessed                            SENSATION:          01/16/2022: Sunday Corn touch: Appears intact   Edema/Atrophy: 07/01/2022:   Quad 6 inches proximal to superior patellar border  Rt: 21 inch           05/28/2022:   Quad 6 inches proximal to superior patellar border  Rt: 20.5 inch          01/16/2022: jt measurement Lt: 14.5 inch   Rt: 16.5 inch                          Quad 6 inches proximal to superior patellar border Lt: 21 inch    Rt: 20.5  inch     PALPATION: 01/16/2022: Mild tenderness around incisions, distal quad insertion   LE AROM/PROM:   A/PROM Right 01/16/2022 Left 01/16/2022 Right 01/28/2022 Right 02/04/2022 Right 02/18/2022 Right 03/14/2022 Rt 03/25/2022 Right 04/22/2022  Hip flexion            Hip extension            Hip abduction            Hip adduction            Hip internal rotation            Hip external rotation            Knee flexion AROM in supine heel slide: 90   PROM in supine heel slide: 93 AROM in supine heel slide: 146 AROM in supine heel slide: 114 degrees AROM in supine Heel slide: 115 degrees AROM in supine heel slide 130 degrees AROM in supine heel slide 135  AROM  Supine heel slide 136 degrees  AROM in supine heel slide 138 (soft tissue approximation)  Knee extension AROM in LAQ: -50 c pain noted PROM in supine heel prop -2   AROM in LAQ:  2 degrees hyperextension   AROM in sitting LAQ: -5 degrees AROM in supine -5 degrees  AROM 0 deg AROM 0 degrees AROM 0 degrees  Ankle  dorsiflexion            Ankle plantarflexion            Ankle inversion            Ankle eversion             (Blank rows = not tested)   LE MMT:   MMT   Left 01/16/2022 Right 02/21/2022 Left 02/21/2022 Right 03/07/2022 Right 03/14/2022 Right 04/03/2022 Right 04/22/2022 Right 05/09/2022 Right 05/28/2022  Hip flexion 5/5          Hip extension            Hip abduction            Hip adduction            Hip internal rotation            Hip external rotation            Knee flexion 5/5      5/5    Knee extension 5/5 4/5 36, 33 lbs 5/5 91.9, 93.5 lbs  5/5 46, 41 lbs 5/5 57, 55 lbs 5/5 64.5, 69.9 lbs 5/5 71, 69 lbs 5/5  5/5 74.9, 70 lbs  Ankle dorsiflexion 5/5          Ankle plantarflexion            Ankle inversion 5/5          Ankle eversion 5/5           (Blank rows = not tested)       FUNCTIONAL TESTS:  06/11/2022:  Eccentric lateral step down 8 inch:  Rt demonstrated increased lateral/medial displacement in loading c mild decrease in control of descent compared to Lt.   05/01/2022: 30 seconds on foam SLS, performed bilaterally  04/07/2022:  Y balance testing Lt composite school: 101%, Rt: 95%  03/14/2022:  Rt SLS 30 seconds even ground  02/11/2022:  Rt SLS: 25 seconds  01/28/2022: able to perform SLR x 15 c good control( Requirement noted from MD note for ambulation without immobilizer  01/16/2022:  Rt SLS:  unable   GAIT: 01/28/2022: Ambulation into clinic without immobilizer, decreased stance on Lt, lacking full TKE in terminal swing  01/20/2022: Immobilizer Rt knee c no crutches noted  Today's Treatment:  07/01/2022: Therex:       Recumbent bike lvl 5 6 mins   Split squat c foot on foam (back foot in chair) 2 x 15 bilateral 10 lbs    Walking fwd/reverse lunges 20 ft x 4 each way c trunk rotation 5 lb kettle bell   Double leg hop fwd/back short 15 sec x 5  Neuro re-ed:       Blazepod reactive 5 point stair directional changes 345 sec x 5 (30 second breaks)   SLS y  balance testing reaching x 10 bilateral on foam   06/11/2022: Therex:       Recumbent bike lvl 5 6 mins   RDL x20 10 lbs bilateral   Lateral step down 8 inch c TKE blue band 2 x 15 bilateral   Walking fwd/reverse lunges 20 ft x 4 each way c trunk rotation 5 lb kettle bell  Neuro re-ed:       Blazepod reactive 5 point stair directional changes 30 sec x 5 (15 second breaks)   Blazepod shuttle drill reactive light 4 pods 30 seconds x 5 (15 second breaks)     05/28/2022: Therex:       Recumbent bike lvl 5 6 mins   Split squat 12 lbs 20x bilateral   Lateral step down 6 inch c 10 lb kettle bell 2 x 15 bilateral Neuro re-ed:       6 inch jump off landing c quick lateral push off movement x 15 bilateral  Scissor quick feet 20 sec x 4   Star shaped light reaction pods 30 sec x 5 multidirectional movements to touch   SLS c ball circles with contralateral leg 5 x cw, ccw bilateral      PATIENT EDUCATION:  01/16/2022:  Education details: HEP, POC, precautions Person educated: Patient Education method: Consulting civil engineer, Demonstration, Verbal cues, and Handouts Education comprehension: verbalized understanding and returned demonstration     HOME EXERCISE PROGRAM: Access Code: [email or web address]  ASSESSMENT:   CLINICAL IMPRESSION: Medical necessity for continued agility and movement control activity to allow progressive return to recreation.  Good progress overall noted at this time.  Required reassessment/recert to allow for last two visits.     REHAB POTENTIAL: Good   CLINICAL DECISION MAKING: Stable/uncomplicated   EVALUATION COMPLEXITY: Low     GOALS: Goals reviewed with patient? Yes   SHORT TERM GOALS:  STG Name Target Date Goal status  1 Patient will demonstrate independent use of home exercise program to maintain progress from in clinic treatments.   02/06/2022 Met  2 Patient will demonstrate safe independent ambulation community distances  02/06/2022 Met Assessed 01/30/2022                                                  LONG TERM GOALS:    LTG Name Target Date Goal status  1 Patient will demonstrate/report pain at worst less than or equal to 2/10 to facilitate minimal limitation in daily activity secondary to pain symptoms.  07/09/2022 On going  06/11/2022  2 Patient will demonstrate independent use of home exercise program to facilitate ability to maintain/progress functional gains from skilled physical therapy services.  07/09/2022 On going  06/11/2022  3 Patient will demonstrate FOTO outcome > or = 75% to indicated reduced disability due to condition. 07/09/2022 On going  06/11/2022  4 Patient will demonstrate Rt  knee AROM 0-120 degrees to facilitate ability to perform transfers, sitting, ambulation, stair navigation s restriction due to mobility. 03/27/2022 MET 03/14/2022  5 Patient will demonstrate Rt LE MMT 5/5, within 15% dynamometry readings throughout to facilitate ability to perform usual standing, walking, stairs at PLOF s limitation due to symptoms. 07/09/2022 On going  06/11/2022  6 Patient will demonstrate bilateral SLS > 30 seconds s deviation.  03/27/2022 MET 03/14/2022  7 Pt will demonstrate ascending/descending stairs reciprocal gait pattern s UE assist for household navigation.    03/27/2022 MET 03/07/2022    PLAN: PT FREQUENCY: 1x/week   PT DURATION: 8 weeks    PLANNED INTERVENTIONS: Therapeutic exercises, Therapeutic activity, Neuro Muscular re-education, Balance training, Gait training, Patient/Family education, Joint mobilization, Stair training, DME instructions, Dry Needling, Electrical stimulation, Cryotherapy, Moist heat, Taping, Ultrasound, Ionotophoresis 74m/ml Dexamethasone, and Manual therapy.  All included unless contraindicated   PLAN FOR NEXT SESSION: Recert to extend POC date for allow 2 last visit.   No OKC leg extension machine per MD   MScot Jun PT, DPT, OCS, ATC 07/01/22  10:10 AM

## 2022-07-04 ENCOUNTER — Encounter: Payer: Self-pay | Admitting: Rehabilitative and Restorative Service Providers"

## 2022-07-04 ENCOUNTER — Ambulatory Visit (INDEPENDENT_AMBULATORY_CARE_PROVIDER_SITE_OTHER): Payer: PRIVATE HEALTH INSURANCE | Admitting: Rehabilitative and Restorative Service Providers"

## 2022-07-04 DIAGNOSIS — M6281 Muscle weakness (generalized): Secondary | ICD-10-CM

## 2022-07-04 DIAGNOSIS — M25561 Pain in right knee: Secondary | ICD-10-CM

## 2022-07-04 DIAGNOSIS — R262 Difficulty in walking, not elsewhere classified: Secondary | ICD-10-CM | POA: Diagnosis not present

## 2022-07-04 DIAGNOSIS — R600 Localized edema: Secondary | ICD-10-CM

## 2022-07-04 DIAGNOSIS — M25661 Stiffness of right knee, not elsewhere classified: Secondary | ICD-10-CM | POA: Diagnosis not present

## 2022-07-04 NOTE — Therapy (Signed)
OUTPATIENT PHYSICAL THERAPY TREATMENT NOTE  Patient Name: Leroy Griffith MRN: [id number] DOB:June 10, 1986, 36 y.o., male Today's Date: 07/04/2022  PCP: Pcp, No REFERRING PROVIDER: No ref. provider found       PT End of Session - 07/04/22 0910     Visit Number 31    Number of Visits 32    Date for PT Re-Evaluation 07/09/22    Authorization Type Workers Compensation    Authorization - Visit Number 7    Authorization - Number of Visits 8    Progress Note Due on Visit 20    PT Start Time [phone]    PT Stop Time 1011    PT Time Calculation (min) 46 min    Activity Tolerance Patient tolerated treatment well    Behavior During Therapy WFL for tasks assessed/performed                   Past Medical History:  Diagnosis Date   GERD (gastroesophageal reflux disease)    Sleep apnea    Past Surgical History:  Procedure Laterality Date   ANTERIOR CRUCIATE LIGAMENT REPAIR Right 01/07/2022   Procedure: RIGHT KNEE ANTERIOR CRUCIATE LIGAMENT RECONSTRUCTION QUAD AUTOGRAFT, PARTIAL MEDIAL MENISCECTOMY;  Surgeon: Meredith Pel, MD;  Location: [facility];  Service: Orthopedics;  Laterality: Right;   Patient Active Problem List   Diagnosis Date Noted   Rupture of anterior cruciate ligament of right knee    Acute medial meniscus tear of right knee     REFERRING PROVIDER: Meredith Pel, MD   REFERRING DIAG: [phone] (ICD-10-CM) - Rupture of anterior cruciate ligament of right knee, initial encounter  THERAPY DIAG:  Acute pain of right knee  Stiffness of right knee, not elsewhere classified  Difficulty in walking, not elsewhere classified  Muscle weakness (generalized)  Localized edema  PERTINENT HISTORY: unremarkable  PRECAUTIONS: No OKC loaded   WEIGHT BEARING RESTRICTIONS None at this time.   SUBJECTIVE:  Pt indicated occasional top of patella tightness/pulling in movements.    PAIN:  Are you having pain? No pain at rest NPRS scale:0/10 Pain location: Rt knee   Pain orientation: Right  PAIN TYPE: surgery  Pain description:  Aggravating factors:  Relieving factors:   OBJECTIVE:   PATIENT SURVEYS:   05/28/2022: FOTO update 70  04/16/2022:  FOTO update:  67  03/14/2022:  FOTO update:  67   01/16/2022 FOTO intake:  43 (predicted:  75)   COGNITION:          01/16/2022: Overall cognitive status: Within functional limits for tasks assessed                            SENSATION:          01/16/2022: Sunday Corn touch: Appears intact   Edema/Atrophy: 07/01/2022:   Quad 6 inches proximal to superior patellar border  Rt: 21 inch           05/28/2022:   Quad 6 inches proximal to superior patellar border  Rt: 20.5 inch          01/16/2022: jt measurement Lt: 14.5 inch   Rt: 16.5 inch                          Quad 6 inches proximal to superior patellar border Lt: 21 inch    Rt: 20.5 inch     PALPATION: 01/16/2022: Mild tenderness around incisions, distal quad insertion  LE AROM/PROM:   A/PROM Right 01/16/2022 Left 01/16/2022 Right 01/28/2022 Right 02/04/2022 Right 02/18/2022 Right 03/14/2022 Rt 03/25/2022 Right 04/22/2022  Hip flexion            Hip extension            Hip abduction            Hip adduction            Hip internal rotation            Hip external rotation            Knee flexion AROM in supine heel slide: 90   PROM in supine heel slide: 93 AROM in supine heel slide: 146 AROM in supine heel slide: 114 degrees AROM in supine Heel slide: 115 degrees AROM in supine heel slide 130 degrees AROM in supine heel slide 135  AROM  Supine heel slide 136 degrees  AROM in supine heel slide 138 (soft tissue approximation)  Knee extension AROM in LAQ: -50 c pain noted PROM in supine heel prop -2   AROM in LAQ:  2 degrees hyperextension   AROM in sitting LAQ: -5 degrees AROM in supine -5 degrees  AROM 0 deg AROM 0 degrees AROM 0 degrees  Ankle dorsiflexion            Ankle plantarflexion            Ankle inversion            Ankle eversion              (Blank rows = not tested)   LE MMT:   MMT   Left 01/16/2022 Right 02/21/2022 Left 02/21/2022 Right 03/07/2022 Right 03/14/2022 Right 04/03/2022 Right 04/22/2022 Right 05/09/2022 Right 05/28/2022 Right 07/04/2022  Hip flexion 5/5           Hip extension             Hip abduction             Hip adduction             Hip internal rotation             Hip external rotation             Knee flexion 5/5      5/5     Knee extension 5/5 4/5 36, 33 lbs 5/5 91.9, 93.5 lbs  5/5 46, 41 lbs 5/5 57, 55 lbs 5/5 64.5, 69.9 lbs 5/5 71, 69 lbs 5/5  5/5 74.9, 70 lbs 5/5 83.3, 75 lbs  Ankle dorsiflexion 5/5           Ankle plantarflexion             Ankle inversion 5/5           Ankle eversion 5/5            (Blank rows = not tested)       FUNCTIONAL TESTS:  06/11/2022:  Eccentric lateral step down 8 inch:  Rt demonstrated increased lateral/medial displacement in loading c mild decrease in control of descent compared to Lt.   05/01/2022: 30 seconds on foam SLS, performed bilaterally  04/07/2022:  Y balance testing Lt composite school: 101%, Rt: 95%  03/14/2022:  Rt SLS 30 seconds even ground  02/11/2022:  Rt SLS: 25 seconds  01/28/2022: able to perform SLR x 15 c good control( Requirement noted from MD note for ambulation without immobilizer  01/16/2022:  Rt  SLS: unable   GAIT: 01/28/2022: Ambulation into clinic without immobilizer, decreased stance on Lt, lacking full TKE in terminal swing  01/20/2022: Immobilizer Rt knee c no crutches noted  Today's Treatment:  07/04/2022: Therex:       Recumbent bike lvl 5 6 mins   Agility ladder:    Forward stepping quick feet 10 ft x 3 leading with each LE first, high knee 10 ft x 3 leading with each LE first    Diagonal stepping quick feet 10 ft x 10    Side stepping in/out 10 ft x 5 each way    Box jump up 8 inch x 20 double leg   Landing off 8 inch step c one quick lateral step x 10 bilateral    Neuro re-ed:       Lunge on upside down bosu  x 15 bilateral   SLS on upside down bosu 1 min x 1 bilateral c knee flexion 15   SLS on foam vector reaching fwd/lateral/side x 10 bilateral   07/01/2022: Therex:       Recumbent bike lvl 5 6 mins   Split squat c foot on foam (back foot in chair) 2 x 15 bilateral 10 lbs    Walking fwd/reverse lunges 20 ft x 4 each way c trunk rotation 5 lb kettle bell   Double leg hop fwd/back short 15 sec x 5  Neuro re-ed:       Blazepod reactive 5 point stair directional changes 345 sec x 5 (30 second breaks)   SLS y balance testing reaching x 10 bilateral on foam   06/11/2022: Therex:       Recumbent bike lvl 5 6 mins   RDL x20 10 lbs bilateral   Lateral step down 8 inch c TKE blue band 2 x 15 bilateral   Walking fwd/reverse lunges 20 ft x 4 each way c trunk rotation 5 lb kettle bell  Neuro re-ed:       Blazepod reactive 5 point stair directional changes 30 sec x 5 (15 second breaks)   Blazepod shuttle drill reactive light 4 pods 30 seconds x 5 (15 second breaks)     PATIENT EDUCATION:  01/16/2022:  Education details: HEP, POC, precautions Person educated: Patient Education method: Consulting civil engineer, Demonstration, Verbal cues, and Handouts Education comprehension: verbalized understanding and returned demonstration     HOME EXERCISE PROGRAM: Access Code: [email or web address]  ASSESSMENT:   CLINICAL IMPRESSION: Continued focus on plyometric/agility based improvements c compliant surface balance interventions to continue to promote improved control in activity.  One more visit remaining at this time.     REHAB POTENTIAL: Good   CLINICAL DECISION MAKING: Stable/uncomplicated   EVALUATION COMPLEXITY: Low     GOALS: Goals reviewed with patient? Yes   SHORT TERM GOALS:  STG Name Target Date Goal status  1 Patient will demonstrate independent use of home exercise program to maintain progress from in clinic treatments.   02/06/2022 Met  2 Patient will demonstrate safe independent ambulation community  distances  02/06/2022 Met Assessed 01/30/2022                                                 LONG TERM GOALS:    LTG Name Target Date Goal status  1 Patient will demonstrate/report pain at worst less than or equal to 2/10 to facilitate minimal  limitation in daily activity secondary to pain symptoms.  07/09/2022 On going  06/11/2022  2 Patient will demonstrate independent use of home exercise program to facilitate ability to maintain/progress functional gains from skilled physical therapy services.  07/09/2022 On going  07/04/2022  3 Patient will demonstrate FOTO outcome > or = 75% to indicated reduced disability due to condition. 07/09/2022 On going  07/04/2022  4 Patient will demonstrate Rt  knee AROM 0-120 degrees to facilitate ability to perform transfers, sitting, ambulation, stair navigation s restriction due to mobility. 03/27/2022 MET 03/14/2022  5 Patient will demonstrate Rt LE MMT 5/5, within 15% dynamometry readings throughout to facilitate ability to perform usual standing, walking, stairs at PLOF s limitation due to symptoms. 07/09/2022 On going  07/04/2022  6 Patient will demonstrate bilateral SLS > 30 seconds s deviation.  03/27/2022 MET 03/14/2022  7 Pt will demonstrate ascending/descending stairs reciprocal gait pattern s UE assist for household navigation.    03/27/2022 MET 03/07/2022    PLAN: PT FREQUENCY: 1x/week   PT DURATION: 8 weeks    PLANNED INTERVENTIONS: Therapeutic exercises, Therapeutic activity, Neuro Muscular re-education, Balance training, Gait training, Patient/Family education, Joint mobilization, Stair training, DME instructions, Dry Needling, Electrical stimulation, Cryotherapy, Moist heat, Taping, Ultrasound, Ionotophoresis 49m/ml Dexamethasone, and Manual therapy.  All included unless contraindicated   PLAN FOR NEXT SESSION: Recert/progress note for last approved visit.   Y balance test.   No OKC leg extension machine per MD   MScot Jun PT, DPT,  OCS, ATC 07/04/22  10:12 AM

## 2022-07-18 ENCOUNTER — Ambulatory Visit (INDEPENDENT_AMBULATORY_CARE_PROVIDER_SITE_OTHER): Payer: PRIVATE HEALTH INSURANCE | Admitting: Rehabilitative and Restorative Service Providers"

## 2022-07-18 ENCOUNTER — Encounter: Payer: Self-pay | Admitting: Rehabilitative and Restorative Service Providers"

## 2022-07-18 DIAGNOSIS — M25561 Pain in right knee: Secondary | ICD-10-CM

## 2022-07-18 DIAGNOSIS — M25661 Stiffness of right knee, not elsewhere classified: Secondary | ICD-10-CM | POA: Diagnosis not present

## 2022-07-18 DIAGNOSIS — R600 Localized edema: Secondary | ICD-10-CM

## 2022-07-18 DIAGNOSIS — M6281 Muscle weakness (generalized): Secondary | ICD-10-CM | POA: Diagnosis not present

## 2022-07-18 DIAGNOSIS — R262 Difficulty in walking, not elsewhere classified: Secondary | ICD-10-CM | POA: Diagnosis not present

## 2022-07-18 NOTE — Therapy (Signed)
OUTPATIENT PHYSICAL THERAPY TREATMENT NOTE /RECERT/ DISCHARGE  Patient Name: Leroy Griffith MRN: [id number] DOB:02-Jun-1986, 36 y.o., male Today's Date: 07/18/2022  Progress Note Reporting Period 05/28/2022 to 07/18/2022  See note below for Objective Data and Assessment of Progress/Goals.           PT End of Session - 07/18/22 0809     Visit Number 32    Number of Visits 32    Date for PT Re-Evaluation 07/09/22    Authorization Type Workers Compensation    Authorization - Visit Number 8    Authorization - Number of Visits 8    Progress Note Due on Visit 54    PT Start Time 0806    PT Stop Time 0845    PT Time Calculation (min) 39 min    Activity Tolerance Patient tolerated treatment well    Behavior During Therapy WFL for tasks assessed/performed                    Past Medical History:  Diagnosis Date   GERD (gastroesophageal reflux disease)    Sleep apnea    Past Surgical History:  Procedure Laterality Date   ANTERIOR CRUCIATE LIGAMENT REPAIR Right 01/07/2022   Procedure: RIGHT KNEE ANTERIOR CRUCIATE LIGAMENT RECONSTRUCTION QUAD AUTOGRAFT, PARTIAL MEDIAL MENISCECTOMY;  Surgeon: Meredith Pel, MD;  Location: [facility];  Service: Orthopedics;  Laterality: Right;   Patient Active Problem List   Diagnosis Date Noted   Rupture of anterior cruciate ligament of right knee    Acute medial meniscus tear of right knee     REFERRING PROVIDER: Meredith Pel, MD   REFERRING DIAG: [phone] (ICD-10-CM) - Rupture of anterior cruciate ligament of right knee, initial encounter  THERAPY DIAG:  Stiffness of right knee, not elsewhere classified  Acute pain of right knee  Difficulty in walking, not elsewhere classified  Muscle weakness (generalized)  Localized edema  PERTINENT HISTORY: unremarkable  PRECAUTIONS: No OKC loaded   WEIGHT BEARING RESTRICTIONS None at this time.   SUBJECTIVE:  No complaints of pain.  Current reporting back to normal about  92.25% at this time.    PAIN:  Are you having pain? No pain at rest NPRS scale:0/10 Pain location: Rt knee  Pain orientation: Right  PAIN TYPE: surgery  Pain description:  Aggravating factors:  Relieving factors:   OBJECTIVE:   PATIENT SURVEYS:   07/18/2022:  FOTO update 75   05/28/2022: FOTO update 70  04/16/2022:  FOTO update:  67  03/14/2022:  FOTO update:  67   01/16/2022 FOTO intake:  43 (predicted:  75)   COGNITION:          01/16/2022: Overall cognitive status: Within functional limits for tasks assessed                            SENSATION:          01/16/2022: Sunday Corn touch: Appears intact   Edema/Atrophy: 07/01/2022:   Quad 6 inches proximal to superior patellar border  Rt: 21 inch           05/28/2022:   Quad 6 inches proximal to superior patellar border  Rt: 20.5 inch          01/16/2022: jt measurement Lt: 14.5 inch   Rt: 16.5 inch                          Quad 6 inches  proximal to superior patellar border Lt: 21 inch    Rt: 20.5 inch     PALPATION: 01/16/2022: Mild tenderness around incisions, distal quad insertion   LE AROM/PROM:   A/PROM Right 01/16/2022 Left 01/16/2022 Right 01/28/2022 Right 02/04/2022 Right 02/18/2022 Right 03/14/2022 Rt 03/25/2022 Right 04/22/2022  Hip flexion            Hip extension            Hip abduction            Hip adduction            Hip internal rotation            Hip external rotation            Knee flexion AROM in supine heel slide: 90   PROM in supine heel slide: 93 AROM in supine heel slide: 146 AROM in supine heel slide: 114 degrees AROM in supine Heel slide: 115 degrees AROM in supine heel slide 130 degrees AROM in supine heel slide 135  AROM  Supine heel slide 136 degrees  AROM in supine heel slide 138 (soft tissue approximation)  Knee extension AROM in LAQ: -50 c pain noted PROM in supine heel prop -2   AROM in LAQ:  2 degrees hyperextension   AROM in sitting LAQ: -5 degrees AROM in supine -5 degrees  AROM 0 deg AROM 0  degrees AROM 0 degrees  Ankle dorsiflexion            Ankle plantarflexion            Ankle inversion            Ankle eversion             (Blank rows = not tested)   LE MMT:   MMT   Left 01/16/2022 Right 02/21/2022 Left 02/21/2022 Right 03/07/2022 Right 03/14/2022 Right 04/03/2022 Right 04/22/2022 Right 05/09/2022 Right 05/28/2022 Right 07/04/2022 Right 07/18/2022  Hip flexion 5/5            Hip extension              Hip abduction              Hip adduction              Hip internal rotation              Hip external rotation              Knee flexion 5/5      5/5      Knee extension 5/5 4/5 36, 33 lbs 5/5 91.9, 93.5 lbs  5/5 46, 41 lbs 5/5 57, 55 lbs 5/5 64.5, 69.9 lbs 5/5 71, 69 lbs 5/5  5/5 74.9, 70 lbs 5/5 83.3, 75 lbs 78.5, 82 lbs  Ankle dorsiflexion 5/5            Ankle plantarflexion              Ankle inversion 5/5            Ankle eversion 5/5             (Blank rows = not tested)       FUNCTIONAL TESTS:  07/18/2022:   Y balance test:    composite score Rt: 98%   Lt:  112 %  06/11/2022:  Eccentric lateral step down 8 inch:  Rt demonstrated increased lateral/medial displacement in loading c mild decrease in control of descent  compared to Lt.   05/01/2022: 30 seconds on foam SLS, performed bilaterally  04/07/2022:  Y balance testing Lt composite school: 101%, Rt: 95%  03/14/2022:  Rt SLS 30 seconds even ground  02/11/2022:  Rt SLS: 25 seconds  01/28/2022: able to perform SLR x 15 c good control( Requirement noted from MD note for ambulation without immobilizer  01/16/2022:  Rt SLS: unable   GAIT: 01/28/2022: Ambulation into clinic without immobilizer, decreased stance on Lt, lacking full TKE in terminal swing  01/20/2022: Immobilizer Rt knee c no crutches noted  Today's Treatment:  07/18/2022: Therex:       Recumbent bike lvl 5 6 mins   Plyo jumps double leg 100 lbs x 20, single leg x 20    Single leg RDL 5 lb kettle bell x 15  Neuro re-ed:       Y balance  reaching x 3 each way bilateral   Upside down bosu lateral step down x20 bilateral  SLS in partial knee flexion c lateral kettle bell swing hand to hand 10 lbs x 20 x3 bilateral   07/04/2022: Therex:       Recumbent bike lvl 5 6 mins   Agility ladder:    Forward stepping quick feet 10 ft x 3 leading with each LE first, high knee 10 ft x 3 leading with each LE first    Diagonal stepping quick feet 10 ft x 10    Side stepping in/out 10 ft x 5 each way    Box jump up 8 inch x 20 double leg   Landing off 8 inch step c one quick lateral step x 10 bilateral    Neuro re-ed:       Lunge on upside down bosu x 15 bilateral   SLS on upside down bosu 1 min x 1 bilateral c knee flexion 15   SLS on foam vector reaching fwd/lateral/side x 10 bilateral     PATIENT EDUCATION:  07/18/2022  Education details: HEP verbal review for discharge Person educated: Patient Education method: Consulting civil engineer, Demonstration, Verbal cues, and Handouts Education comprehension: verbalized understanding and returned demonstration     HOME EXERCISE PROGRAM: Access Code: [email or web address]  ASSESSMENT:   CLINICAL IMPRESSION: Pt has attended 32 visits overall and has completed the last of approved visits at this time. Pt has reported minimal pain complaints at this time.  See objective data for updated information regarding current presentation.  Pt has continued to demonstrated progressive improvements in strength, mobility and movement coordination during course of treatment.    Pt has well established HEP at this time and has progressed into agility based movement control/plyo with good control at this time.  Pt would be appropriate for continued HEP at this time for continued improvements in ability and end the long term return to running and recreational sport activity as appropriate.     REHAB POTENTIAL: Good   CLINICAL DECISION MAKING: Stable/uncomplicated   EVALUATION COMPLEXITY: Low     GOALS: Goals reviewed  with patient? Yes   SHORT TERM GOALS:  STG Name Target Date Goal status  1 Patient will demonstrate independent use of home exercise program to maintain progress from in clinic treatments.   02/06/2022 Met  2 Patient will demonstrate safe independent ambulation community distances  02/06/2022 Met Assessed 01/30/2022  LONG TERM GOALS:    LTG Name Target Date Goal status  1 Patient will demonstrate/report pain at worst less than or equal to 2/10 to facilitate minimal limitation in daily activity secondary to pain symptoms.  07/18/2022 Met 07/18/2022  2 Patient will demonstrate independent use of home exercise program to facilitate ability to maintain/progress functional gains from skilled physical therapy services.  7/26/208/4/202323 Met 07/18/2022  3 Patient will demonstrate FOTO outcome > or = 75% to indicated reduced disability due to condition. 07/18/2022 MET 07/18/2022  4 Patient will demonstrate Rt  knee AROM 0-120 degrees to facilitate ability to perform transfers, sitting, ambulation, stair navigation s restriction due to mobility. 03/27/2022 MET 03/14/2022  5 Patient will demonstrate Rt LE MMT 5/5, within 15% dynamometry readings throughout to facilitate ability to perform usual standing, walking, stairs at PLOF s limitation due to symptoms. 07/18/2022 Met 07/18/2022  6 Patient will demonstrate bilateral SLS > 30 seconds s deviation.  03/27/2022 MET 03/14/2022  7 Pt will demonstrate ascending/descending stairs reciprocal gait pattern s UE assist for household navigation.    03/27/2022 MET 03/07/2022    PLAN: PT FREQUENCY: 1x/week   PT DURATION: 1 visit (today was last approved visits from [facility])    PLANNED INTERVENTIONS: Therapeutic exercises, Therapeutic activity, Neuro Muscular re-education, Balance training, Gait training, Patient/Family education, Joint mobilization, Stair training, DME instructions, Dry Needling, Electrical stimulation, Cryotherapy,  Moist heat, Taping, Ultrasound, Ionotophoresis 31m/ml Dexamethasone, and Manual therapy.  All included unless contraindicated   PLAN FOR NEXT SESSION: D/C to HEP   MScot Jun PT, DPT, OCS, ATC 07/18/22  8:36 AM

## 2022-08-25 ENCOUNTER — Ambulatory Visit (INDEPENDENT_AMBULATORY_CARE_PROVIDER_SITE_OTHER): Payer: No Typology Code available for payment source | Admitting: Surgical

## 2022-08-25 DIAGNOSIS — Z9889 Other specified postprocedural states: Secondary | ICD-10-CM | POA: Diagnosis not present

## 2022-08-31 ENCOUNTER — Encounter: Payer: Self-pay | Admitting: Orthopedic Surgery

## 2022-08-31 NOTE — Progress Notes (Signed)
   Post-Op Visit Note   Patient: Leroy Griffith           Date of Birth: January 25, 1986           MRN: [id number] Visit Date: 08/25/2022 PCP: Pcp, No   Assessment & Plan:  Chief Complaint:  Chief Complaint  Patient presents with   Right Knee - Pain   Visit Diagnoses:  1. S/P ACL reconstruction     Plan: Patient is a 36 year old male who presents s/p right knee anterior cruciate ligament reconstruction with quadricep autograft and partial medial meniscectomy on 01/07/2022.Marland Kitchen  He feels he is doing well.  He has completely finished with physical therapy.  Wants to go back to running.  He denies any consistent swelling or mechanical symptoms.  No buckling of the knee.  Does not plan on playing any sports as he injured this playing basketball and does not want to reinjure his knee.  On exam, patient has 0 degrees extension and greater than 120 degrees of knee flexion.  No calf tenderness.  Negative Homans' sign.  He is able to perform straight leg raise with excellent quad strength rated 5/5.  There is no quad atrophy that is noticeable compared with contralateral side.  Stable to anterior and posterior drawer sign.  No tenderness over the medial or lateral joint lines.  He has no effusion noted.  Stable to Lachman exam with solid endpoint and only a couple millimeters of laxity noted.  Ambulates without antalgia.  Plan is to continue with home exercise program.  Recommended he optimize his quadricep strength in order to protect the knee joint in the future.  Do not think that running for exercise as his primary exercise is the optimal workout for him as he is missing part of that medial meniscus.  Think occasional running would be okay.  Recommend stationary bike or elliptical for cardio to ease the impact on the knee joint.  No sporting events until on 12 months out from procedure but he does not plan on doing this anyway.  He is welcome to follow-up with the office as needed if he has any  discomfort/swelling/instability.  Follow-Up Instructions: No follow-ups on file.   Orders:  No orders of the defined types were placed in this encounter.  No orders of the defined types were placed in this encounter.   Imaging: No results found.  PMFS History: Patient Active Problem List   Diagnosis Date Noted   Rupture of anterior cruciate ligament of right knee    Acute medial meniscus tear of right knee    Past Medical History:  Diagnosis Date   GERD (gastroesophageal reflux disease)    Sleep apnea     No family history on file.  Past Surgical History:  Procedure Laterality Date   ANTERIOR CRUCIATE LIGAMENT REPAIR Right 01/07/2022   Procedure: RIGHT KNEE ANTERIOR CRUCIATE LIGAMENT RECONSTRUCTION QUAD AUTOGRAFT, PARTIAL MEDIAL MENISCECTOMY;  Surgeon: Meredith Pel, MD;  Location: [facility];  Service: Orthopedics;  Laterality: Right;   Social History   Occupational History   Not on file  Tobacco Use   Smoking status: Never   Smokeless tobacco: Never  Vaping Use   Vaping Use: Never used  Substance and Sexual Activity   Alcohol use: Yes    Comment: occassionally   Drug use: Never   Sexual activity: Not on file

## 2023-07-03 ENCOUNTER — Ambulatory Visit: Payer: BC Managed Care – PPO | Admitting: Internal Medicine

## 2023-07-20 IMAGING — MR MR KNEE*R* W/O CM
7 series · 40 of 40 positions shown · non-contrast
Comparison: Right knee x-rays dated December 18, 2021.

CLINICAL DATA: Medial knee pain. Injured his knee twice last month.

EXAM:
MRI OF THE RIGHT KNEE WITHOUT CONTRAST
TECHNIQUE: Multiplanar, multisequence MR imaging of the knee was performed. No
intravenous contrast was administered.

[Series 3: T2 fat-sat · axial · 4.0mm · 0.50mm/px · z∈[-72,+78]mm · 8 of 31 slices shown (1 of 3)]
[im 1/31]
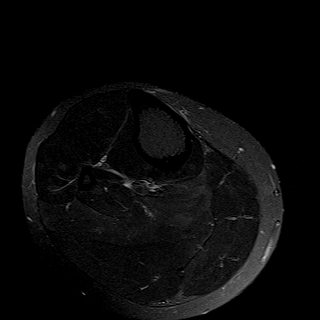
[im 5/31]
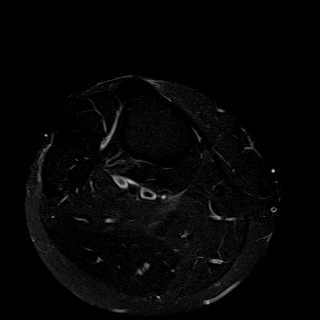
[im 9/31]
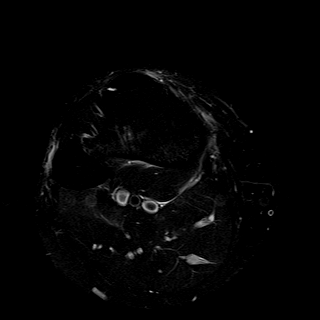
[im 13/31]
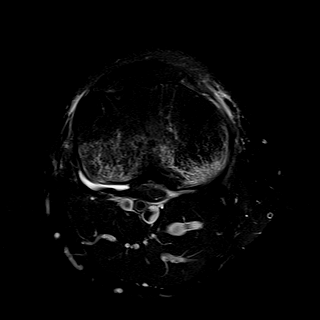
[im 18/31]
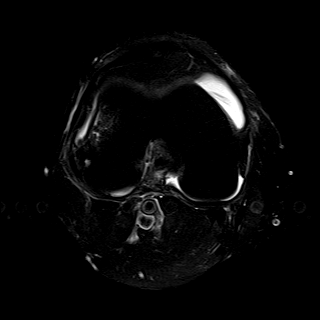
[im 22/31]
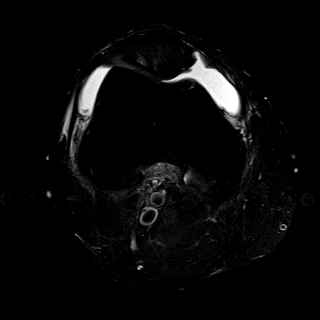
[im 26/31]
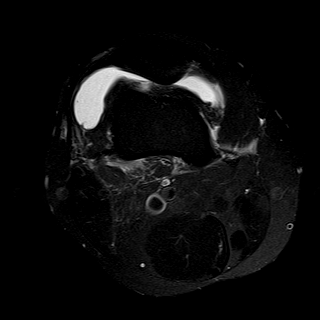
[im 31/31]
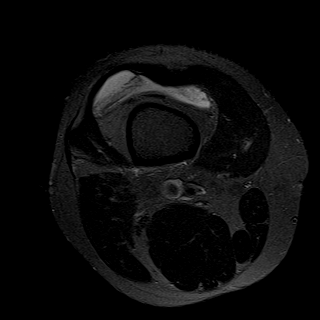

[Series 4: T1 · coronal · 4.0mm · 0.59mm/px · 6 of 26 slices shown]
[im 1/26]
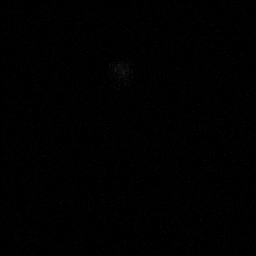
[im 6/26]
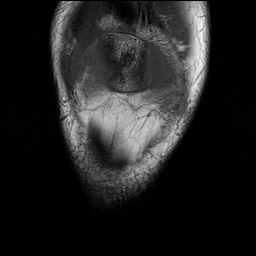
[im 11/26]
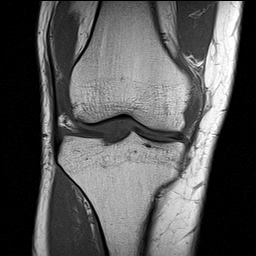
[im 16/26]
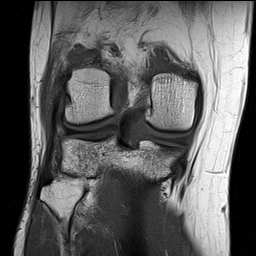
[im 21/26]
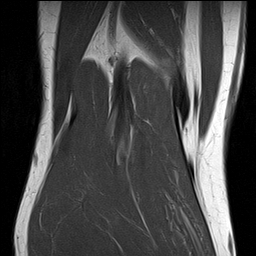
[im 26/26]
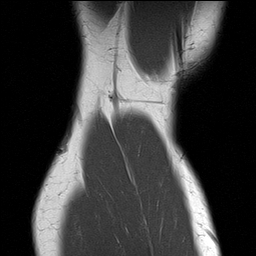

[Series 5: T2 fat-sat · coronal · 4.0mm · 0.59mm/px · 5 of 26 slices shown (2 of 3)]
[im 1/26]
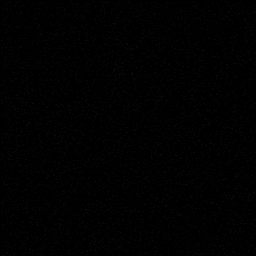
[im 7/26]
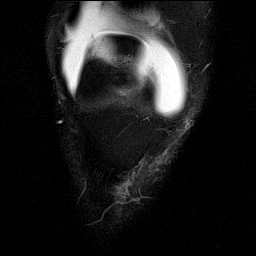
[im 13/26]
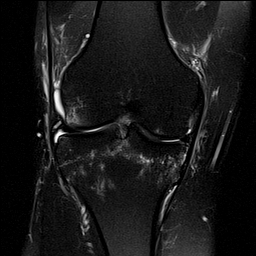
[im 19/26]
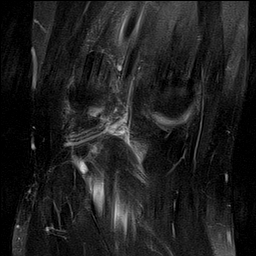
[im 26/26]
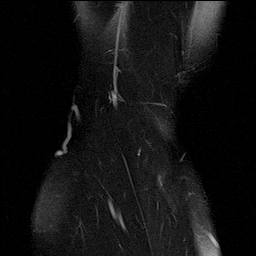

[Series 6: PD fat-sat · coronal · 4.0mm · 0.59mm/px · 5 of 26 slices shown (1 of 3)]
[im 1/26]
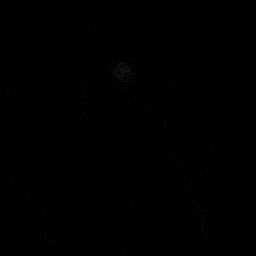
[im 7/26]
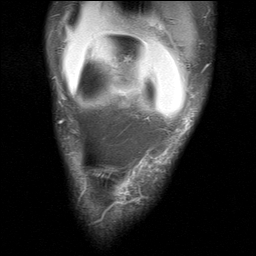
[im 13/26]
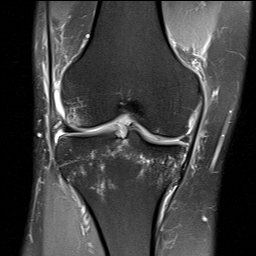
[im 19/26]
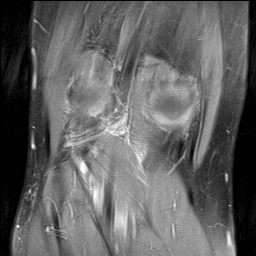
[im 26/26]
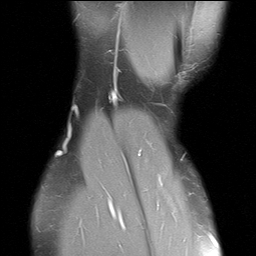

[Series 7: PD fat-sat · sagittal · 3.0mm · 0.59mm/px · 6 of 30 slices shown (2 of 3)]
[im 1/30]
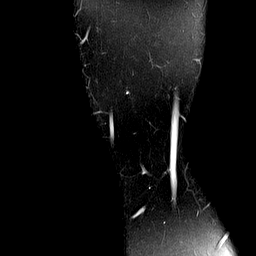
[im 6/30]
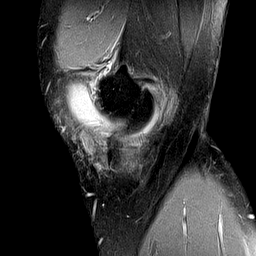
[im 12/30]
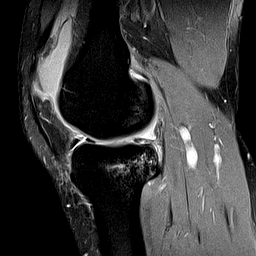
[im 18/30]
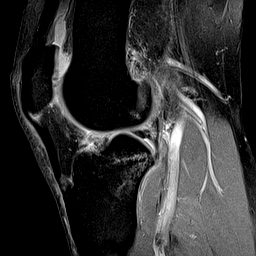
[im 24/30]
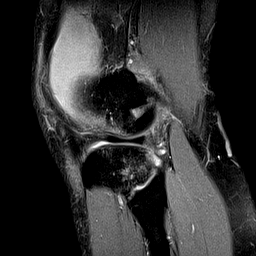
[im 30/30]
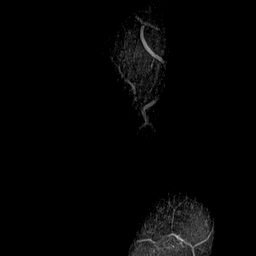

[Series 8: T2 fat-sat · sagittal · 3.0mm · 0.59mm/px · 6 of 30 slices shown (3 of 3)]
[im 1/30]
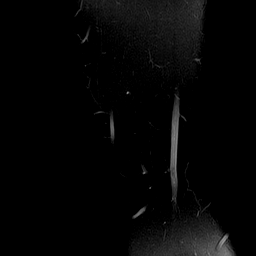
[im 6/30]
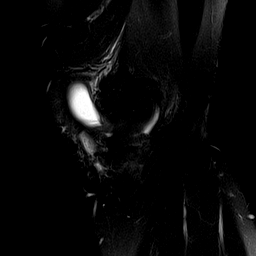
[im 12/30]
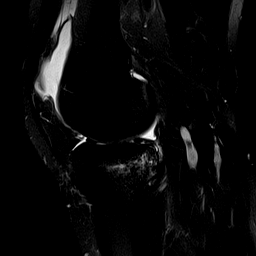
[im 18/30]
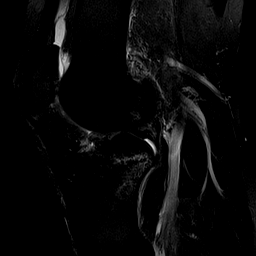
[im 24/30]
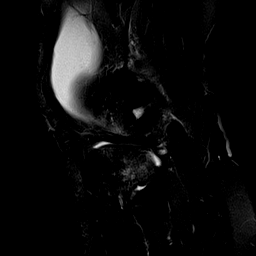
[im 30/30]
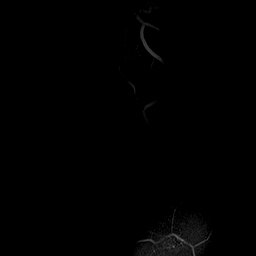

[Series 9: PD fat-sat · coronal · 2.0mm · 0.59mm/px · 4 of 21 slices shown (3 of 3)]
[im 1/21]
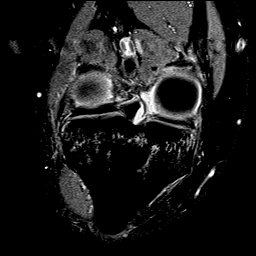
[im 7/21]
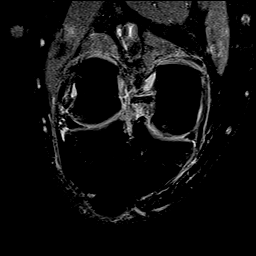
[im 14/21]
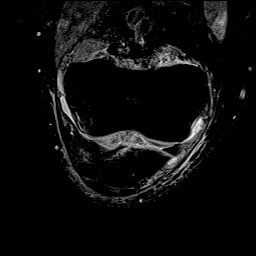
[im 21/21]
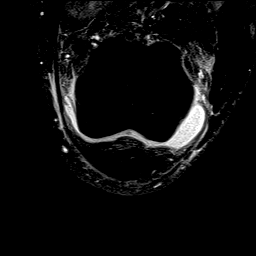

[40 of 40 positions shown; findings below may reference images not displayed]

FINDINGS: MENISCI

Medial meniscus:  Bucket-handle tear.

Lateral meniscus:  Intact.

LIGAMENTS

Cruciates:  Complete tear of the ACL. The PCL is intact.

Collaterals: Medial collateral ligament is intact. Lateral
collateral ligament complex is intact.

CARTILAGE

Patellofemoral: Focal partial-thickness cartilage loss over the
patellar apex and superficial fissuring over the lateral facet with
subchondral marrow edema.

Medial: Focal full-thickness fissuring over the central
weight-bearing medial femoral condyle. No chondral defect.

Lateral:  No chondral defect.

Joint:  Moderate joint effusion. Normal Hoffa's fat.

Popliteal Fossa:  No Baker cyst. Intact popliteus tendon.

Extensor Mechanism: Intact quadriceps tendon and patellar tendon.
Mild proximal patellar tendinosis. Intact medial and lateral
patellar retinaculum. Intact MPFL.

Bones: Osteochondral impaction injury of the peripheral lateral
femoral condyle. Contusions of the posterior medial and lateral
tibial plateaus. No acute fracture or dislocation. No suspicious
bone lesion.

Other: None.
IMPRESSION: 1. Complete tear of the ACL.
2. Bucket-handle tear of the medial meniscus.
3. Associated typical osseous injuries of pivot-shift mechanism. No
fracture.

## 2023-08-26 ENCOUNTER — Encounter: Payer: Self-pay | Admitting: Internal Medicine

## 2023-08-26 ENCOUNTER — Ambulatory Visit (INDEPENDENT_AMBULATORY_CARE_PROVIDER_SITE_OTHER): Payer: BC Managed Care – PPO | Admitting: Internal Medicine

## 2023-08-26 ENCOUNTER — Telehealth: Payer: Self-pay | Admitting: Internal Medicine

## 2023-08-26 VITALS — BP 110/70 | HR 74 | Temp 97.7°F | Ht 69.0 in | Wt 185.2 lb

## 2023-08-26 DIAGNOSIS — G4733 Obstructive sleep apnea (adult) (pediatric): Secondary | ICD-10-CM | POA: Insufficient documentation

## 2023-08-26 DIAGNOSIS — Z9889 Other specified postprocedural states: Secondary | ICD-10-CM

## 2023-08-26 DIAGNOSIS — Z119 Encounter for screening for infectious and parasitic diseases, unspecified: Secondary | ICD-10-CM

## 2023-08-26 DIAGNOSIS — J302 Other seasonal allergic rhinitis: Secondary | ICD-10-CM | POA: Diagnosis not present

## 2023-08-26 DIAGNOSIS — G4730 Sleep apnea, unspecified: Secondary | ICD-10-CM

## 2023-08-26 DIAGNOSIS — Z0001 Encounter for general adult medical examination with abnormal findings: Secondary | ICD-10-CM

## 2023-08-26 DIAGNOSIS — Z8042 Family history of malignant neoplasm of prostate: Secondary | ICD-10-CM

## 2023-08-26 DIAGNOSIS — H6192 Disorder of left external ear, unspecified: Secondary | ICD-10-CM | POA: Diagnosis not present

## 2023-08-26 DIAGNOSIS — Z0000 Encounter for general adult medical examination without abnormal findings: Secondary | ICD-10-CM

## 2023-08-26 LAB — CBC WITH DIFFERENTIAL/PLATELET
Basophils Absolute: 0.1 10*3/uL (ref 0.0–0.1)
Basophils Relative: 1.7 % (ref 0.0–3.0)
Eosinophils Absolute: 0.2 10*3/uL (ref 0.0–0.7)
Eosinophils Relative: 4.3 % (ref 0.0–5.0)
HCT: 44.9 % (ref 39.0–52.0)
Hemoglobin: 14.4 g/dL (ref 13.0–17.0)
Lymphocytes Relative: 33.3 % (ref 12.0–46.0)
Lymphs Abs: 1.5 10*3/uL (ref 0.7–4.0)
MCHC: 32.1 g/dL (ref 30.0–36.0)
MCV: 85.8 fl (ref 78.0–100.0)
Monocytes Absolute: 0.4 10*3/uL (ref 0.1–1.0)
Monocytes Relative: 9.6 % (ref 3.0–12.0)
Neutro Abs: 2.4 10*3/uL (ref 1.4–7.7)
Neutrophils Relative %: 51.1 % (ref 43.0–77.0)
Platelets: 244 10*3/uL (ref 150.0–400.0)
RBC: 5.23 Mil/uL (ref 4.22–5.81)
RDW: 15.2 % (ref 11.5–15.5)
WBC: 4.6 10*3/uL (ref 4.0–10.5)

## 2023-08-26 LAB — COMPREHENSIVE METABOLIC PANEL
ALT: 21 U/L (ref 0–53)
AST: 20 U/L (ref 0–37)
Albumin: 4.3 g/dL (ref 3.5–5.2)
Alkaline Phosphatase: 49 U/L (ref 39–117)
BUN: 16 mg/dL (ref 6–23)
CO2: 29 meq/L (ref 19–32)
Calcium: 9.9 mg/dL (ref 8.4–10.5)
Chloride: 103 meq/L (ref 96–112)
Creatinine, Ser: 1.02 mg/dL (ref 0.40–1.50)
GFR: 93.28 mL/min (ref >60.00)
Glucose, Bld: 96 mg/dL (ref 70–99)
Potassium: 4.5 meq/L (ref 3.5–5.1)
Sodium: 138 meq/L (ref 135–145)
Total Bilirubin: 0.4 mg/dL (ref 0.2–1.2)
Total Protein: 7.3 g/dL (ref 6.0–8.3)

## 2023-08-26 LAB — LIPID PANEL
Cholesterol: 207 mg/dL — ABNORMAL HIGH (ref 0–200)
HDL: 60.9 mg/dL (ref >39.00)
LDL Cholesterol: 130 mg/dL — ABNORMAL HIGH (ref 0–99)
NonHDL: 146.11
Total CHOL/HDL Ratio: 3
Triglycerides: 79 mg/dL (ref 0.0–149.0)
VLDL: 15.8 mg/dL (ref 0.0–40.0)

## 2023-08-26 LAB — TSH: TSH: 1.6 u[IU]/mL (ref 0.35–5.50)

## 2023-08-26 NOTE — Assessment & Plan Note (Signed)
His maternal grandfather was diagnosed with prostate cancer in his 3s. We will consider PSA screening in the future, especially if the onset of his grandfather's disease was early. He declined after shared decision making for today

## 2023-08-26 NOTE — Assessment & Plan Note (Signed)
During the physical exam, a wart was identified in his left ear. We will refer him to an Ear, Nose, and Throat specialist for removal.Dry ear wax was noted during the physical exam. We recommend the occasional use of oil droplets to prevent skin from cracking.

## 2023-08-26 NOTE — Progress Notes (Signed)
[facility]  Phone: [phone]  New patient visit  Visit Date: 08/26/2023 Patient: Leroy Griffith   DOB: [birth year]   37 y.o. Male  MRN: [id number] Patient Care Team: [name], MD as PCP - General (Internal Medicine) Today's Health Care Provider: [name], MD   Assessment & Plan Preventative health care He should continue his healthy diet and exercise regimen, use sunscreen regularly, plan to receive the flu vaccine at his employer's flu clinic, and schedule his next annual physical after completing lab work.  Encounter for annual general medical examination with abnormal findings in adult  Today's Health Maintenance Counseling and Anticipatory Guidance:  Eye exams:  Every 1-2 years was suggested Dental Health:  Reminded about importance of regular tooth brushing, flossing, and dental visits q6 months Sinus Hygiene:  Recommended daily sinus rinsing with sterile saline mist.  Cardiovascular Risk Factor Reduction:   Advised patient of need for regular exercise and diet rich and fruits and vegetables (high in fiber) and healthy fats (omega-3 unsaturated fats in fish/nuts/avocados/evoo) to reduce risk of heart attack and stroke. Avoid first and 2nd hand smoke and stimulants.   Avoid extreme exercise, only exercise in moderation Nutrition and Weight Management: recommended a diet to achieve a lean physique, in addition to a heart-healthy diet.  Avoid fattening foods with carbohydrates unless resistance training. Wt Readings from Last 3 Encounters:  08/26/23 185 lb 3.2 oz (84 kg)  01/07/22 182 lb (82.6 kg)  12/18/21 184 lb 12.8 oz (83.8 kg)   Body mass index is 27.35 kg/m. / but minimal truncal adiposity  Substance use:  advise total abstinence from all substances of abuse including smoke(including 2nd hand), alcohol, illicit drugs, inhalants, sugar. STD screening: testing is available.  reports being sexually active. He reports using the following methods  of birth control/protection: Other-see comments and Surgical. Sleep Apnea screening:  he reports mild obstructive sleep apnea. Depression screening:      08/26/2023   10:47 AM 08/26/2023    8:09 AM  Depression screen PHQ 2/9  Decreased Interest 0 1  Down, Depressed, Hopeless 0 0  PHQ - 2 Score 0 1  Altered sleeping  0  Tired, decreased energy  1  Change in appetite  0  Feeling bad or failure about yourself   0  Trouble concentrating  1  Moving slowly or fidgety/restless  0  Suicidal thoughts  0  PHQ-9 Score  3  Difficult doing work/chores  Not difficult at all   Injury prevention: Discussed safety belts, safety helmets, smoke detectors.  Avoid adventure sports and high impact exercises. Health maintenance and immunizations reviewed and he was encouraged to complete anything that is due: Immunization History  Administered Date(s) Administered   Influenza Split 09/30/2017   Influenza,inj,Quad PF,6+ Mos 02/02/2019   Influenza-Unspecified 09/29/2012, 10/17/2013, 09/13/2014, 08/23/2015, 09/28/2016, 09/24/2019   Pfizer Covid-19 Vaccine Bivalent Booster 21yrs & up 08/21/2021   Tdap 07/07/2011, 04/09/2018   Health Maintenance Due  Topic Date Due   HIV Screening  Never done   Hepatitis C Screening  Never done    Today's Cancer Screening Guidance: Penile & Testicular cancer screening:  He was advised to palpate his testicles, scrotum, and penis for masses and inform me of any.   Prostate cancer screening:  Denies family history of prostate cancer or hematospermia so too young for screening by current guidelines. No results found for: "PSA"     Colon cancer screening:  Denies strong family history of colon cancer  or blood in stool so no screening is indicated until age 59.   Reasonable options include: High-sensitivity gFOBT or FIT every year sDNA-FIT every 1 to 3 years CT colonography every 5 years Flexible sigmoidoscopy every 5 years Flexible sigmoidoscopy every 10 years?+?FIT  every year Colonoscopy screening every 10 years Thyroid cancer screening:  encouraged to self exam thyroid for nodules every 6-12 months Skin cancer screening:  Advised regular sunscreen use.  He denies worrisome, changing, or new skin lesions.  Follow up 1 yr recommended  Screening examination for infectious disease  Lesion of external auditory canal, left During the physical exam, a wart was identified in his left ear. We will refer him to an Ear, Nose, and Throat specialist for removal.Dry ear wax was noted during the physical exam. We recommend the occasional use of oil droplets to prevent skin from cracking. Seasonal allergies  History of right knee surgery  Family history of prostate cancer His maternal grandfather was diagnosed with prostate cancer in his 28s. We will consider PSA screening in the future, especially if the onset of his grandfather's disease was early. Mild sleep apnea He was previously diagnosed with mild sleep apnea but is currently asymptomatic. We advise him to use Snorlab and other tools for nightly tracking.   Diagnoses and all orders for this visit: Preventative health care Encounter for annual general medical examination with abnormal findings in adult Screening examination for infectious disease -     CBC w/Diff -     Comp Met (CMET) -     Lipid panel -     Hepatitis C Antibody -     HIV antibody (with reflex) -     TSH Lesion of external auditory canal, left -     Ambulatory referral to ENT Seasonal allergies History of right knee surgery Family history of prostate cancer Mild sleep apnea  @Recommended  follow up: No follow-ups on file. Future Appointments  Date Time Provider Department Center  10/26/2023 11:00 AM Ashok Croon, MD [facility] None      Subjective  37 y.o. male who has Lesion of external auditory canal, left; Seasonal allergies; History of right knee surgery; Family history of prostate cancer; and Mild sleep apnea on their  problem list. His reasons/main concerns/chief complaints for today's office visit are New Patient (Initial Visit) and Annual Exam   -------------------------------------------------------------------------------------------------------- AI-Extracted: Discussed the use of AI scribe software for clinical note transcription with the patient, who gave verbal consent to proceed.  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 The patient reported a generally good state of health, with no significant active issues. The patient's past medical history includes a right ACL repair and meniscus clean-up in January of the previous year, which has since healed without complications.  The patient reported a sporadic exercise routine, primarily consisting of bodyweight calisthenics and occasional use of the Peloton app for yoga and strength training. The patient also reported a balanced diet, primarily consisting of home-cooked meals with plenty of fruits and vegetables, and minimal red meat and processed foods.  The patient has a known history of mild obstructive sleep apnea (OSA), diagnosed through a previous sleep study. The patient reported that weight loss was recommended to manage the condition, but no CPAP or other invasive interventions were deemed necessary. The patient also reported a family history of prostate cancer in the maternal grandfather, which may warrant future screening considerations.  No  other significant findings were reported during the physical examination.  The patient's preventive care includes annual eye exams due to contact lens use, and an upcoming flu shot to be administered at the patient's workplace.       ------------------------------------------------------------------------------------------------------------------------ He has a past medical history of Acute medial meniscus  tear of right knee, Allergy, GERD (gastroesophageal reflux disease), Rupture of anterior cruciate ligament of right knee, and Sleep apnea. Problem list overviews that were updated at today's visit: Problem  Lesion of External Auditory Canal, Left  Seasonal Allergies  History of Right Knee Surgery   2023 ligament graft, meniscus and acl repair    Family History of Prostate Cancer  Mild Sleep Apnea  Rupture of Anterior Cruciate Ligament of Right Knee (Resolved)  Acute Medial Meniscus Tear of Right Knee (Resolved)   Current Outpatient Medications on File Prior to Visit  Medication Sig   calcium carbonate (TUMS - DOSED IN MG ELEMENTAL CALCIUM) 500 MG chewable tablet Chew by mouth as needed.   cetirizine (ZYRTEC) 10 MG tablet Take by mouth.   loratadine (CLARITIN) 10 MG tablet Take by mouth daily as needed.   PARoxetine (PAXIL) 10 MG tablet Take 10 mg by mouth daily.   tadalafil (CIALIS) 5 MG tablet Take by mouth daily as needed.   No current facility-administered medications on file prior to visit.   Medications Discontinued During This Encounter  Medication Reason   gabapentin (NEURONTIN) 300 MG capsule    methocarbamol (ROBAXIN) 500 MG tablet Completed Course     Problems: has Lesion of external auditory canal, left; Seasonal allergies; History of right knee surgery; Family history of prostate cancer; and Mild sleep apnea on their problem list. Current Meds  Medication Sig   calcium carbonate (TUMS - DOSED IN MG ELEMENTAL CALCIUM) 500 MG chewable tablet Chew by mouth as needed.   cetirizine (ZYRTEC) 10 MG tablet Take by mouth.   loratadine (CLARITIN) 10 MG tablet Take by mouth daily as needed.   PARoxetine (PAXIL) 10 MG tablet Take 10 mg by mouth daily.   tadalafil (CIALIS) 5 MG tablet Take by mouth daily as needed.   Allergies:   Allergies  Allergen Reactions   Corylus Swelling   Past Medical History:  has a past medical history of Acute medial meniscus tear of right knee,  Allergy, GERD (gastroesophageal reflux disease), Rupture of anterior cruciate ligament of right knee, and Sleep apnea. Past Surgical History:   has a past surgical history that includes Anterior cruciate ligament repair (Right, 01/07/2022). Social History:   reports that he has never smoked. He has never used smokeless tobacco. He reports current alcohol use. He reports that he does not use drugs. Family History:  family history includes Alcohol abuse in his paternal grandfather; Arthritis in his maternal grandmother; Cancer in his maternal grandfather and maternal grandmother; Depression in his father; Early death in his father; Hearing loss in his paternal grandmother; Mental illness in his father. Depression Screen and Health Maintenance:    08/26/2023   10:47 AM 08/26/2023    8:09 AM  PHQ 2/9 Scores  PHQ - 2 Score 0 1  PHQ- 9 Score  3   Health Maintenance  Topic Date Due   HIV Screening  Never done   Hepatitis C Screening  Never done   INFLUENZA VACCINE  09/15/2023 (Originally 07/16/2023)   COVID-19 Vaccine (2 - 2023-24 season) 12/04/2023 (Originally 08/16/2023)   DTaP/Tdap/Td (3 - Td or Tdap) 04/09/2028   HPV VACCINES  Aged Out  Immunization History  Administered Date(s) Administered   Influenza Split 09/30/2017   Influenza,inj,Quad PF,6+ Mos 02/02/2019   Influenza-Unspecified 09/29/2012, 10/17/2013, 09/13/2014, 08/23/2015, 09/28/2016, 09/24/2019   Pfizer Covid-19 Vaccine Bivalent Booster 72yrs & up 08/21/2021   Tdap 07/07/2011, 04/09/2018     Objective   Physical ExamBP 110/70 (BP Location: Left Arm, Patient Position: Sitting)   Pulse 74   Temp 97.7 F (36.5 C) (Temporal)   Ht 5\' 9"  (1.753 m)   Wt 185 lb 3.2 oz (84 kg)   SpO2 97%   BMI 27.35 kg/m  Wt Readings from Last 10 Encounters:  08/26/23 185 lb 3.2 oz (84 kg)  01/07/22 182 lb (82.6 kg)  12/18/21 184 lb 12.8 oz (83.8 kg)  10/11/18 165 lb (74.8 kg)  08/24/18 165 lb (74.8 kg)  07/12/18 165 lb (74.8 kg)  05/25/18 165  lb (74.8 kg)  Vital signs reviewed.  Nursing notes reviewed. Weight trend reviewed. General Appearance/Constitutional:   Overweight male in no acute distress.  He is authentic, earnest, and polite. HEENT:  normocephalic, atraumatic, conjunctiva clear.  Tympanic membranes normal bilaterally. Oropharynx clear. No thyromegaly of thyroid nodules detected.  Pulmonary:   normal work of breathing, no respiratory distress, clear to auscultation bilaterally, no wheezes rhonchi or rales appreciated Cardiovascular:  Normal heart rate. Normal rhythm. No murmurs, rubs, or gallops.   Musculoskeletal: All extremities are intact.  , No edema, cyanosis, or clubbing noted Skin: Warm, Dry, no lesions of concern Neurological:  Awake, alert,  No obvious focal neurological deficits or cognitive impairments, Patellar reflexes symmetric and intact bilaterally.  Psychiatric:  Appropriate mood, pleasant demeanor, Normal affect and thought content Problem-specific findings:  wart left external auditory canal, dry skin bilateral eac  Physical Exam   MEASUREMENTS: WT- 185 HEENT: Dry cerumen in ears, wart in left ear, oropharynx without erythema or exudate, dentition in good repair       Reviewed Results & Data Results   DIAGNOSTIC Sleep study: Mild obstructive sleep apnea        Results for orders placed or performed in visit on 08/26/23  CBC w/Diff  Result Value Ref Range   WBC 4.6 4.0 - 10.5 K/uL   RBC 5.23 4.22 - 5.81 Mil/uL   Hemoglobin 14.4 13.0 - 17.0 g/dL   HCT 82.9 56.2 - 13.0 %   MCV 85.8 78.0 - 100.0 fl   MCHC 32.1 30.0 - 36.0 g/dL   RDW 86.5 78.4 - 69.6 %   Platelets 244.0 150.0 - 400.0 K/uL   Neutrophils Relative % 51.1 43.0 - 77.0 %   Lymphocytes Relative 33.3 12.0 - 46.0 %   Monocytes Relative 9.6 3.0 - 12.0 %   Eosinophils Relative 4.3 0.0 - 5.0 %   Basophils Relative 1.7 0.0 - 3.0 %   Neutro Abs 2.4 1.4 - 7.7 K/uL   Lymphs Abs 1.5 0.7 - 4.0 K/uL   Monocytes Absolute 0.4 0.1 - 1.0 K/uL    Eosinophils Absolute 0.2 0.0 - 0.7 K/uL   Basophils Absolute 0.1 0.0 - 0.1 K/uL  Comp Met (CMET)  Result Value Ref Range   Sodium 138 135 - 145 mEq/L   Potassium 4.5 3.5 - 5.1 mEq/L   Chloride 103 96 - 112 mEq/L   CO2 29 19 - 32 mEq/L   Glucose, Bld 96 70 - 99 mg/dL   BUN 16 6 - 23 mg/dL   Creatinine, Ser 2.95 0.40 - 1.50 mg/dL   Total Bilirubin 0.4 0.2 - 1.2 mg/dL  Alkaline Phosphatase 49 39 - 117 U/L   AST 20 0 - 37 U/L   ALT 21 0 - 53 U/L   Total Protein 7.3 6.0 - 8.3 g/dL   Albumin 4.3 3.5 - 5.2 g/dL   GFR 81.19 >14.78 mL/min   Calcium 9.9 8.4 - 10.5 mg/dL  Lipid panel  Result Value Ref Range   Cholesterol 207 (H) 0 - 200 mg/dL   Triglycerides 29.5 0.0 - 149.0 mg/dL   HDL 62.13 >08.65 mg/dL   VLDL 78.4 0.0 - 69.6 mg/dL   LDL Cholesterol 295 (H) 0 - 99 mg/dL   Total CHOL/HDL Ratio 3    NonHDL 146.11   TSH  Result Value Ref Range   TSH 1.60 0.35 - 5.50 uIU/mL    Office Visit on 08/26/2023  Component Date Value   WBC 08/26/2023 4.6    RBC 08/26/2023 5.23    Hemoglobin 08/26/2023 14.4    HCT 08/26/2023 44.9    MCV 08/26/2023 85.8    MCHC 08/26/2023 32.1    RDW 08/26/2023 15.2    Platelets 08/26/2023 244.0    Neutrophils Relative % 08/26/2023 51.1    Lymphocytes Relative 08/26/2023 33.3    Monocytes Relative 08/26/2023 9.6    Eosinophils Relative 08/26/2023 4.3    Basophils Relative 08/26/2023 1.7    Neutro Abs 08/26/2023 2.4    Lymphs Abs 08/26/2023 1.5    Monocytes Absolute 08/26/2023 0.4    Eosinophils Absolute 08/26/2023 0.2    Basophils Absolute 08/26/2023 0.1    Sodium 08/26/2023 138    Potassium 08/26/2023 4.5    Chloride 08/26/2023 103    CO2 08/26/2023 29    Glucose, Bld 08/26/2023 96    BUN 08/26/2023 16    Creatinine, Ser 08/26/2023 1.02    Total Bilirubin 08/26/2023 0.4    Alkaline Phosphatase 08/26/2023 49    AST 08/26/2023 20    ALT 08/26/2023 21    Total Protein 08/26/2023 7.3    Albumin 08/26/2023 4.3    GFR 08/26/2023 93.28     Calcium 08/26/2023 9.9    Cholesterol 08/26/2023 207 (H)    Triglycerides 08/26/2023 79.0    HDL 08/26/2023 60.90    VLDL 08/26/2023 15.8    LDL Cholesterol 08/26/2023 130 (H)    Total CHOL/HDL Ratio 08/26/2023 3    NonHDL 08/26/2023 146.11    TSH 08/26/2023 1.60        Additional notes: Initial Appointment Goals:  This initial visit focused on establishing a foundation for the patient's care. We collaboratively reviewed his medical history and medications in detail, updating the chart as shown in the encounter. Given the extensive information, we prioritized addressing his most pressing concerns, which he reported were: New Patient (Initial Visit) and Annual Exam  While the complexity of the patient's medical picture may necessitate further evaluation in subsequent visits, we were able to develop a preliminary care plan together. To expedite a comprehensive plan at the next visit, we encouraged the patient to gather relevant medical records from previous providers. This collaborative approach will ensure a more complete understanding of the patient's health and inform the development of a personalized care plan. We look forward to continuing the conversation and working together with the patient on achieving his health goals.   Collaborative Documentation:  Today's encounter utilized real-time, dynamic patient engagement.  Patients actively participate by directly reviewing and assisting in updating their medical records through a shared screen. This transparency empowers patients to visually confirm chart updates made  by the healthcare provider.  This collaborative approach facilitates problem management as we jointly update the problem list, problem overview, and assessment/plan. Ultimately, this process enhances chart accuracy and completeness, fostering shared decision-making, patient education, and informed consent for tests and treatments.  Collaborative Treatment Planning:  Treatment  plans were discussed and reviewed in detail.  Explained medication safety and potential side effects.  Encouraged participation and answered all patient questions, confirming understanding and comfort with the plan. Encouraged patient to contact our office if they have any questions or concerns. Agreed on patient returning to office if symptoms worsen, persist, or new symptoms develop. Discussed precautions in case of needing to visit the Emergency Department.  ----------------------------------------------------- [name], MD  08/26/2023 6:00 PM  [facility]:  [phone]

## 2023-08-26 NOTE — Assessment & Plan Note (Signed)
He was previously diagnosed with mild sleep apnea but is currently asymptomatic. We advise him to use Snorlab and other tools for nightly tracking.

## 2023-08-26 NOTE — Assessment & Plan Note (Signed)
His maternal grandfather was diagnosed with prostate cancer in his 82s. We will consider PSA screening in the future, especially if the onset of his grandfather's disease was early.

## 2023-08-26 NOTE — Patient Instructions (Signed)
VISIT SUMMARY:  During your recent visit for a routine physical examination, we discussed your overall health and any concerns you may have. You are generally in good health, with a past medical history of a right ACL repair and meniscus clean-up, which has healed well. You have a known history of mild obstructive sleep apnea, which is currently asymptomatic. We also discussed your family history of prostate cancer.  YOUR PLAN:  -WART IN LEFT EAR: A wart was found in your left ear during the physical exam. A wart is a small growth with a rough texture that can appear anywhere on the body. We will refer you to an Ear, Nose, and Throat specialist for removal.  -DRY EAR: Dry ear wax was noted during the physical exam. Dry ear wax can cause discomfort and potential skin cracking. We recommend the occasional use of oil droplets to prevent this.  -MILD SLEEP APNEA: You were previously diagnosed with mild sleep apnea, a condition where breathing stops and starts during sleep. You are currently asymptomatic, but we advise you to use Snorlab and other tools for nightly tracking.  -FAMILY HISTORY OF PROSTATE CANCER: Your maternal grandfather was diagnosed with prostate cancer. Prostate cancer is a common type of cancer in men. We will consider PSA screening in the future, especially if the onset of his disease was early.  -GENERAL HEALTH MAINTENANCE: You should continue your healthy diet and exercise regimen, use sunscreen regularly, plan to receive the flu vaccine at your employer's flu clinic, and schedule your next annual physical after completing lab work.  INSTRUCTIONS:  Please follow up with the Ear, Nose, and Throat specialist for the removal of the wart in your left ear. Continue to use Snorlab and other tools for nightly tracking of your sleep apnea. We will discuss the possibility of PSA screening in the future. Continue your healthy lifestyle habits, including diet and exercise, and remember to  use sunscreen regularly. Plan to receive your flu vaccine at your employer's flu clinic and schedule your next annual physical after completing lab work.

## 2023-08-26 NOTE — Telephone Encounter (Signed)
Pt informed

## 2023-08-26 NOTE — Progress Notes (Signed)
[facility]: [phone]   -- Annual Preventive Medical Office Visit --  Patient:  Leroy Griffith      Age: 37 y.o.       Sex:  male  Date:   08/26/2023 Patient Care Team: [name], MD as PCP - General (Internal Medicine) Today's Healthcare Provider: [name], MD   Chief Complaint  Patient presents with   New Patient (Initial Visit)   Annual Exam    Assessment & Plan Preventative health care  Encounter for annual general medical examination with abnormal findings in adult  Screening examination for infectious disease  Lesion of external auditory canal, left During the physical exam, a wart was identified in his left ear. We will refer him to an Ear, Nose, and Throat specialist for removal. Dry ear wax was noted during the physical exam. We recommend the occasional use of oil droplets to prevent skin from cracking. Seasonal allergies  History of right knee surgery  Family history of prostate cancer His maternal grandfather was diagnosed with prostate cancer in his 15s. We will consider PSA screening in the future, especially if the onset of his grandfather's disease was early. He declined after shared decision making for today Mild sleep apnea He was previously diagnosed with mild sleep apnea but is currently asymptomatic. We advise him to use Snorlab and other tools for nightly tracking. Assessment and Plan      Diagnoses and all orders for this visit: Preventative health care Encounter for annual general medical examination with abnormal findings in adult Screening examination for infectious disease -     CBC w/Diff -     Comp Met (CMET) -     Lipid panel -     Hepatitis C Antibody -     HIV antibody (with reflex) -     TSH Lesion of external auditory canal, left -     Ambulatory referral to ENT Seasonal allergies History of right knee surgery Family history of prostate cancer Mild sleep apnea  Today's Health Maintenance  Counseling and Anticipatory Guidance:  Eye exams:  every 1-2 years recommended.  last eye exam was:  8 m ago Dental health: Discussed importance of regular tooth brushing, flossing, and dental visits q6 months.  Poor dentition can lead to serious medical problems - particularly problems with heart valves.  reports he has been keeping up with his dental visits.  He intends to do so in the future.  Sinus health: Encourage sterile saline nasal misting sinus rinses daily for pollen, to reduce allergies and risk for sinus infections.   Sterile can based misting products are recommended due to superior misting and ease of maintaining sterility . Sleep Apnea screening: mild obstructive sleep apnea not needing sleep study advised patient to do SnoreLab App and other tools Cardiovascular Risk Factor Reduction:   Advised patient of need for regular exercise and diet rich and fruits and vegetables and healthy fats to reduce risk of heart attack and stroke.  Avoid first- and second-hand smoke and stimulants.   Avoid extreme exercise- exercise in moderation (150 minutes per week is a good goal) Wt Readings from Last 3 Encounters:  08/26/23 185 lb 3.2 oz (84 kg)  01/07/22 182 lb (82.6 kg)  12/18/21 184 lb 12.8 oz (83.8 kg)  Body mass index is 27.35 kg/m. He reports his diet consists of eats home cooked, doesn't eat out much, eats fish fruits and vegetables, not a ton of red meat He reports his exercise  includes of hiit and Peloton lots of walks  Health maintenance and immunizations reviewed and he was encouraged to complete anything that is due: Immunization History  Administered Date(s) Administered   Influenza Split 09/30/2017   Influenza,inj,Quad PF,6+ Mos 02/02/2019   Influenza-Unspecified 09/29/2012, 10/17/2013, 09/13/2014, 08/23/2015, 09/28/2016, 09/24/2019   Pfizer Covid-19 Vaccine Bivalent Booster 44yrs & up 08/21/2021   Tdap 07/07/2011, 04/09/2018   Health Maintenance Due  Topic Date Due   HIV  Screening  Never done   Hepatitis C Screening  Never done    Sexual transmitted infection screening: testing offered today, but patient declined as he feels he is low risk based on his sexual history.   Social History   Substance and Sexual Activity  Sexual Activity Yes   Birth control/protection: Other-see comments, Surgical   Comment: Vasectomy     Substance use:  I discussed that my recommendation is total abstinence from all substances of abuse including smoke and 2nd hand smoke, alcohol, illicit drugs, smoking, inhalants, sugar.   Offered to assist with any use disorders or addictions.   Injury prevention: Discussed safety belts, safety helmets, smoke detectors. Cancer Screening: Thyroid cancer screening: patient was screened no thyromegaly today. Prostate cancer screening:  has  Colon cancer screening:    Denies strong family history of colon cancer or blood in stool so no screening is indicated until age 80.   Skin cancer screening-  Advised regular sunscreen use. He denies worrisome, changing, or new skin lesions. Showed him pictures of melanomas for reference:  Return to care in 1 year for next preventative visit.   Subjective  AI-Extracted: Discussed the use of AI scribe software for clinical note transcription with the patient, who gave verbal consent to proceed.  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 The patient reported a generally good state of health, with no significant active issues. The patient's past medical history includes a right ACL repair and meniscus clean-up in January of the previous year, which has since healed without complications.  The patient reported a sporadic exercise routine, primarily consisting of bodyweight calisthenics and occasional use of the Peloton app for yoga and strength training. The patient also reported a balanced diet, primarily  consisting of home-cooked meals with plenty of fruits and vegetables, and minimal red meat and processed foods.  The patient has a known history of mild obstructive sleep apnea (OSA), diagnosed through a previous sleep study. The patient reported that weight loss was recommended to manage the condition, but no CPAP or other invasive interventions were deemed necessary. The patient also reported a family history of prostate cancer in the maternal grandfather, which may warrant future screening considerations.  No other significant findings were reported during the physical examination.  The patient's preventive care includes annual eye exams due to contact lens use, and an upcoming flu shot to be administered at the patient's workplace.      Review of Systems  Constitutional:  Negative for chills, diaphoresis, fever, malaise/fatigue and weight loss.  HENT:  Negative for congestion, ear discharge, ear pain, hearing loss, nosebleeds, sinus pain, sore throat and tinnitus.   Eyes:  Negative for blurred vision, double vision, photophobia, pain, discharge and redness.  Respiratory:  Negative for cough, hemoptysis, sputum production, shortness of breath, wheezing and stridor.   Cardiovascular:  Negative for chest pain, palpitations, orthopnea, claudication, leg swelling and PND.  Gastrointestinal:  Negative for abdominal pain,  blood in stool, constipation, diarrhea, heartburn, melena, nausea and vomiting.  Genitourinary:  Negative for dysuria, flank pain, frequency, hematuria and urgency.  Musculoskeletal:  Negative for back pain, falls, joint pain, myalgias and neck pain.  Skin:  Negative for itching and rash.  Neurological:  Negative for dizziness, tingling, tremors, sensory change, speech change, focal weakness, seizures, loss of consciousness, weakness and headaches.  Endo/Heme/Allergies:  Negative for environmental allergies and polydipsia. Does not bruise/bleed easily.  Psychiatric/Behavioral:   Negative for depression, hallucinations, memory loss, substance abuse and suicidal ideas. The patient is not nervous/anxious and does not have insomnia.      Disclaimer about ROS at Annual Preventive Visits Patients are informed before the Review of Systems (ROS) that identifying significant medical issues during the wellness visit may require immediate attention, potentially resulting in a separate billable encounter beyond the scope of the preventive exam. This disclosure is mandated by professional ethics and legal obligations, as healthcare providers must address any substantial health concerns raised during any patient interaction. A comprehensive ROS is required by insurance companies for billing the visit. However, this structure may inadvertently discourage patients from fully disclosing health concerns due to potential financial implications. Consequently, patients often emphasize that any positive ROS findings are related to stable chronic conditions, requesting that these not be discussed during the preventive visit to avoid additional charges. Patients may also ask that reported complaints not be listed in the ROS to prevent affecting billing.  Problem list overviews that were updated at today's visit: Problem  Lesion of External Auditory Canal, Left  Seasonal Allergies  History of Right Knee Surgery   2023 ligament graft, meniscus and acl repair    Family History of Prostate Cancer  Mild Sleep Apnea  Rupture of Anterior Cruciate Ligament of Right Knee (Resolved)  Acute Medial Meniscus Tear of Right Knee (Resolved)   I attest that I have reviewed and confirmed the patients current medications to meet the medication reconciliation requirement  Current Outpatient Medications on File Prior to Visit  Medication Sig   calcium carbonate (TUMS - DOSED IN MG ELEMENTAL CALCIUM) 500 MG chewable tablet Chew by mouth as needed.   cetirizine (ZYRTEC) 10 MG tablet Take by mouth.   loratadine  (CLARITIN) 10 MG tablet Take by mouth daily as needed.   PARoxetine (PAXIL) 10 MG tablet Take 10 mg by mouth daily.   tadalafil (CIALIS) 5 MG tablet Take by mouth daily as needed.   No current facility-administered medications on file prior to visit.   Medications Discontinued During This Encounter  Medication Reason   gabapentin (NEURONTIN) 300 MG capsule    methocarbamol (ROBAXIN) 500 MG tablet Completed Course   The following were reviewed and entered/updated into our electronic MEDICAL RECORD NUMBER   08/26/2023    8:09 AM  Depression screen PHQ 2/9  Decreased Interest 1  Down, Depressed, Hopeless 0  PHQ - 2 Score 1  Altered sleeping 0  Tired, decreased energy 1  Change in appetite 0  Feeling bad or failure about yourself  0  Trouble concentrating 1  Moving slowly or fidgety/restless 0  Suicidal thoughts 0  PHQ-9 Score 3  Difficult doing work/chores Not difficult at all   Past Medical History:  Diagnosis Date   Acute medial meniscus tear of right knee    Allergy    GERD (gastroesophageal reflux disease)    Rupture of anterior cruciate ligament of right knee    Sleep apnea    Patient Active Problem List  Diagnosis Date Noted   Lesion of external auditory canal, left 08/26/2023   Seasonal allergies 08/26/2023   History of right knee surgery 08/26/2023   Family history of prostate cancer 08/26/2023   Mild sleep apnea 08/26/2023   Past Surgical History:  Procedure Laterality Date   ANTERIOR CRUCIATE LIGAMENT REPAIR Right 01/07/2022   Procedure: RIGHT KNEE ANTERIOR CRUCIATE LIGAMENT RECONSTRUCTION QUAD AUTOGRAFT, PARTIAL MEDIAL MENISCECTOMY;  Surgeon: Cammy Copa, MD;  Location: [facility] OR;  Service: Orthopedics;  Laterality: Right;   Family History  Problem Relation Age of Onset   Early death Father    Depression Father    Mental illness Father    Cancer Maternal Grandmother    Arthritis Maternal Grandmother    Cancer Maternal Grandfather    Hearing loss  Paternal Grandmother    Alcohol abuse Paternal Grandfather    Allergies  Allergen Reactions   Corylus Swelling   Social History   Tobacco Use   Smoking status: Never   Smokeless tobacco: Never  Vaping Use   Vaping status: Never Used  Substance Use Topics   Alcohol use: Yes    Comment: occassionally   Drug use: Never     Objective  BP 110/70 (BP Location: Left Arm, Patient Position: Sitting)   Pulse 74   Temp 97.7 F (36.5 C) (Temporal)   Ht 5\' 9"  (1.753 m)   Wt 185 lb 3.2 oz (84 kg)   SpO2 97%   BMI 27.35 kg/m   Body mass index is 27.35 kg/m. Wt Readings from Last 3 Encounters:  08/26/23 185 lb 3.2 oz (84 kg)  01/07/22 182 lb (82.6 kg)  12/18/21 184 lb 12.8 oz (83.8 kg)   GEN: NAD, Resting Comfortably. Wart / verrucous lesion in left ear ear canal which has dry skin HEENT: Tympanic membranes normal appearing bilaterally, Oropharynx clear, No Thyromegaly noted. No palpable lymphadenopathy or thyroid nodules. CARDIOVASCULAR: S1 and S2 heart sounds have regular rate and rhythm with no murmurs appreciated. PULMONARY:  Normal work of breathing. Clear to auscultation bilaterally with no crackles, wheezes, or rhonchi. ABDOMEN: Soft, Nontender, Nondistended.  MSK: No edema, cyanosis, or clubbing noted. SKIN: Warm, dry, no lesions of concern observed. NEURO: CN2-12 grossly intact. Strength 5/5 in upper and lower extremities. Reflexes symmetric and intact bilaterally.  PSYCH: Normal affect and thought content, pleasant and cooperative.

## 2023-08-26 NOTE — Telephone Encounter (Signed)
Patient requesting transfer of care from Dr.Morrison to [facility].Please Advise

## 2023-08-27 LAB — HEPATITIS C ANTIBODY: Hepatitis C Ab: NONREACTIVE

## 2023-08-27 LAB — HIV ANTIBODY (ROUTINE TESTING W REFLEX): HIV 1&2 Ab, 4th Generation: NONREACTIVE

## 2023-09-18 DIAGNOSIS — Z23 Encounter for immunization: Secondary | ICD-10-CM | POA: Diagnosis not present

## 2023-10-20 ENCOUNTER — Telehealth: Payer: BC Managed Care – PPO | Admitting: Family Medicine

## 2023-10-20 DIAGNOSIS — J069 Acute upper respiratory infection, unspecified: Secondary | ICD-10-CM

## 2023-10-20 MED ORDER — FLUTICASONE PROPIONATE 50 MCG/ACT NA SUSP: 2.0000 | Freq: Every day | NASAL | 0 refills | Status: AC

## 2023-10-20 MED ORDER — BENZONATATE 100 MG PO CAPS: 100.0000 mg | ORAL_CAPSULE | Freq: Three times a day (TID) | ORAL | 0 refills | Status: DC | PRN

## 2023-10-20 MED ORDER — PSEUDOEPH-BROMPHEN-DM 30-2-10 MG/5ML PO SYRP: 5.0000 mL | ORAL_SOLUTION | Freq: Four times a day (QID) | ORAL | 0 refills | Status: DC | PRN

## 2023-10-20 NOTE — Progress Notes (Signed)
E-Visit for Upper Respiratory Infection   We are sorry you are not feeling well.  Here is how we plan to help!  Based on what you have shared with me, it looks like you may have a viral upper respiratory infection.  Upper respiratory infections are caused by a large number of viruses; however, rhinovirus is the most common cause.   Symptoms vary from person to person, with common symptoms including sore throat, cough, fatigue or lack of energy and feeling of general discomfort.  A low-grade fever of up to 100.4 may present, but is often uncommon.  Symptoms vary however, and are closely related to a person's age or underlying illnesses.  The most common symptoms associated with an upper respiratory infection are nasal discharge or congestion, cough, sneezing, headache and pressure in the ears and face.  These symptoms usually persist for about 3 to 10 days, but can last up to 2 weeks.  It is important to know that upper respiratory infections do not cause serious illness or complications in most cases.    Upper respiratory infections can be transmitted from person to person, with the most common method of transmission being a person's hands.  The virus is able to live on the skin and can infect other persons for up to 2 hours after direct contact.  Also, these can be transmitted when someone coughs or sneezes; thus, it is important to cover the mouth to reduce this risk.  To keep the spread of the illness at bay, good hand hygiene is very important.  This is an infection that is most likely caused by a virus. There are no specific treatments other than to help you with the symptoms until the infection runs its course.  We are sorry you are not feeling well.  Here is how we plan to help!   For nasal congestion, you may use an oral decongestants such as Mucinex D or if you have glaucoma or high blood pressure use plain Mucinex.  Saline nasal spray or nasal drops can help and can safely be used as often as  needed for congestion.  For your congestion, I have prescribed Fluticasone nasal spray one spray in each nostril twice a day  If you do not have a history of heart disease, hypertension, diabetes or thyroid disease, prostate/bladder issues or glaucoma, you may also use Sudafed to treat nasal congestion.  It is highly recommended that you consult with a pharmacist or your primary care physician to ensure this medication is safe for you to take.     If you have a cough, you may use cough suppressants  For cough I have prescribed for you A prescription cough medication called Tessalon Perles 100 mg. You may take 1-2 capsules every 8 hours as needed for cough  I will also order a cough syrup for you that will help with congestion and coughing. Bromfed DM   If you have a sore or scratchy throat, use a saltwater gargle-  to  teaspoon of salt dissolved in a 4-ounce to 8-ounce glass of warm water.  Gargle the solution for approximately 15-30 seconds and then spit.  It is important not to swallow the solution.  You can also use throat lozenges/cough drops and Chloraseptic spray to help with throat pain or discomfort.  Warm or cold liquids can also be helpful in relieving throat pain.  For headache, pain or general discomfort, you can use Ibuprofen or Tylenol as directed.   Some authorities believe that  zinc sprays or the use of Echinacea may shorten the course of your symptoms.   HOME CARE Only take medications as instructed by your medical team. Be sure to drink plenty of fluids. Water is fine as well as fruit juices, sodas and electrolyte beverages. You may want to stay away from caffeine or alcohol. If you are nauseated, try taking small sips of liquids. How do you know if you are getting enough fluid? Your urine should be a pale yellow or almost colorless. Get rest. Taking a steamy shower or using a humidifier may help nasal congestion and ease sore throat pain. You can place a towel over your head  and breathe in the steam from hot water coming from a faucet. Using a saline nasal spray works much the same way. Cough drops, hard candies and sore throat lozenges may ease your cough. Avoid close contacts especially the very young and the elderly Cover your mouth if you cough or sneeze Always remember to wash your hands.   GET HELP RIGHT AWAY IF: You develop worsening fever. If your symptoms do not improve within 10 days You develop yellow or green discharge from your nose over 3 days. You have coughing fits You develop a severe head ache or visual changes. You develop shortness of breath, difficulty breathing or start having chest pain Your symptoms persist after you have completed your treatment plan  MAKE SURE YOU  Understand these instructions. Will watch your condition. Will get help right away if you are not doing well or get worse.  Thank you for choosing an e-visit.  Your e-visit answers were reviewed by a board certified advanced clinical practitioner to complete your personal care plan. Depending upon the condition, your plan could have included both over the counter or prescription medications.  Please review your pharmacy choice. Make sure the pharmacy is open so you can pick up prescription now. If there is a problem, you may contact your provider through [employer] and have the prescription routed to another pharmacy.  Your safety is important to [place]. If you have drug allergies check your prescription carefully.   For the next 24 hours you can use MyChart to ask questions about today's visit, request a non-urgent call back, or ask for a work or school excuse. You will get an email in the next two days asking about your experience. I hope that your e-visit has been valuable and will speed your recovery.  I provided 5 minutes of non face-to-face time during this encounter for chart review, medication and order placement, as well as and documentation.

## 2023-10-26 ENCOUNTER — Encounter (INDEPENDENT_AMBULATORY_CARE_PROVIDER_SITE_OTHER): Payer: Self-pay | Admitting: Otolaryngology

## 2023-10-26 ENCOUNTER — Ambulatory Visit (INDEPENDENT_AMBULATORY_CARE_PROVIDER_SITE_OTHER): Payer: BC Managed Care – PPO | Admitting: Otolaryngology

## 2023-10-26 VITALS — BP 115/90 | HR 82 | Ht 69.0 in | Wt 184.0 lb

## 2023-10-26 DIAGNOSIS — H608X3 Other otitis externa, bilateral: Secondary | ICD-10-CM

## 2023-10-26 DIAGNOSIS — R0981 Nasal congestion: Secondary | ICD-10-CM | POA: Diagnosis not present

## 2023-10-26 NOTE — Progress Notes (Signed)
ENT CONSULT:  Discussed the use of AI scribe software for clinical note transcription with the patient, who gave verbal consent to proceed.  History of Present Illness   The patient was referred by their primary care physician due to an abnormal finding in the ear canal. The patient reported a habit of manually clearing ear wax, raising the possibility of self-inflicted scratches or scar tissue. They described the abnormality as a wart-like structure. However, the patient denied experiencing any symptoms such as hearing impairment or pain. They also reported a history of sinus drainage and use of Claritin and Flonase for management.  The patient has a history of large tonsils and had recently recovered from an upper respiratory infection. They also reported a tendency towards dry skin in the ear canals and frequent ear wax accumulation. Despite these issues, the patient denied any history of recent ear infections or surgeries.  The patient's ear care routine involves avoiding Q-tips and occasionally using their fingers for cleaning. They reported no new medications and no changes in their current regimen. The patient also mentioned a previous ENT consultation for a deviated septum, but no interventions were performed for this condition.       Records Reviewed:  PCP note 08/26/23 Noted to have left ear canal lesion - sent to [place] for this    Past Medical History:  Diagnosis Date   Acute medial meniscus tear of right knee    Allergy    GERD (gastroesophageal reflux disease)    Rupture of anterior cruciate ligament of right knee    Sleep apnea     Past Surgical History:  Procedure Laterality Date   ANTERIOR CRUCIATE LIGAMENT REPAIR Right 01/07/2022   Procedure: RIGHT KNEE ANTERIOR CRUCIATE LIGAMENT RECONSTRUCTION QUAD AUTOGRAFT, PARTIAL MEDIAL MENISCECTOMY;  Surgeon: Cammy Copa, MD;  Location: [facility] OR;  Service: Orthopedics;  Laterality: Right;    Family History  Problem Relation  Age of Onset   Early death Father    Depression Father    Mental illness Father    Cancer Maternal Grandmother    Arthritis Maternal Grandmother    Cancer Maternal Grandfather    Hearing loss Paternal Grandmother    Alcohol abuse Paternal Grandfather     Social History:  reports that he has never smoked. He has never used smokeless tobacco. He reports current alcohol use. He reports that he does not use drugs.  Allergies:  Allergies  Allergen Reactions   Corylus Swelling    Medications: I have reviewed the patient's current medications.  The PMH, PSH, Medications, Allergies, and SH were reviewed and updated.  ROS: Constitutional: Negative for fever, weight loss and weight gain. Cardiovascular: Negative for chest pain and dyspnea on exertion. Respiratory: Is not experiencing shortness of breath at rest. Gastrointestinal: Negative for nausea and vomiting. Neurological: Negative for headaches. Psychiatric: The patient is not nervous/anxious  Blood pressure (!) 115/90, pulse 82, height 5\' 9"  (1.753 m), weight 184 lb (83.5 kg), SpO2 98%.  PHYSICAL EXAM:  Exam: General: Well-developed, well-nourished Respiratory Respiratory effort: Equal inspiration and expiration without stridor Cardiovascular Peripheral Vascular: Warm extremities with equal color/perfusion Eyes: No nystagmus with equal extraocular motion bilaterally Neuro/Psych/Balance: Patient oriented to person, place, and time; Appropriate mood and affect; Gait is intact with no imbalance; Cranial nerves I-XII are intact Head and Face Inspection: Normocephalic and atraumatic without mass or lesion Palpation: Facial skeleton intact without bony stepoffs Salivary Glands: No mass or tenderness Facial Strength: Facial motility symmetric and full bilaterally ENT Pinna:  External ear intact and fully developed External canal: Canal is patent with intact skin  Ears without wax or lesions, presence of dry skin in ear canal.   Tympanic Membrane: Clear and mobile External Nose: No scar or anatomic deformity Internal Nose: Septum intact and midline. No edema, polyp, or rhinorrhea Lips, Teeth, and gums: Mucosa and teeth intact and viable TMJ: No pain to palpation with full mobility Oral cavity/oropharynx: No erythema or exudate, no lesions present tonsils 1-2+ cryptic no exudate Neck Neck and Trachea: Midline trachea without mass or lesion Thyroid: No mass or nodularity Lymphatics: No lymphadenopathy  Procedure: none  Assessment/Plan: Encounter Diagnoses  Name Primary?   Chronic eczematous otitis externa of both ears Yes   Chronic nasal congestion     Assessment and Plan    Dry Skin in Ear Canal Habit of clearing ear wax with finger. Examination revealed dry skin but no other abnormalities. Discussed Sweet Oil (mineral oil) as a safe option to alleviate dryness and prevent itching. Advised to dry ears thoroughly after showering to avoid moisture buildup. Advised to avoid Qtips. - Recommend Sweet Oil (mineral oil) application to the ear canal - Advise drying ears thoroughly after showering  Ear Canal Lesion (resolved) Referred for ear canal lesion resembling a wart on the left side noted by PCP. No symptoms such as hearing loss, pain, or drainage. Examination revealed no lesions or ear wax; ear canal appeared normal. Lesion may have resolved spontaneously. - No further action required  Chronic Nasal Congestion Nasal congestion and sinus drainage managed with Claritin and Flonase. Reports satisfactory control of symptoms with current regimen. - Continue Claritin 10 mg daily and Flonase 2 puffs b/l nares BID        Thank you for allowing me to participate in the care of this patient. Please do not hesitate to contact me with any questions or concerns.   Ashok Croon, MD Otolaryngology [facility] Phone: [phone] Fax: [phone]    10/26/2023, 11:14 AM

## 2023-12-18 ENCOUNTER — Ambulatory Visit: Payer: BC Managed Care – PPO | Admitting: Physician Assistant

## 2023-12-24 ENCOUNTER — Ambulatory Visit: Payer: BC Managed Care – PPO | Admitting: Physician Assistant

## 2024-03-08 DIAGNOSIS — Z713 Dietary counseling and surveillance: Secondary | ICD-10-CM | POA: Diagnosis not present

## 2024-03-24 DIAGNOSIS — Z713 Dietary counseling and surveillance: Secondary | ICD-10-CM | POA: Diagnosis not present

## 2024-04-11 DIAGNOSIS — Z713 Dietary counseling and surveillance: Secondary | ICD-10-CM | POA: Diagnosis not present

## 2024-05-17 DIAGNOSIS — Z713 Dietary counseling and surveillance: Secondary | ICD-10-CM | POA: Diagnosis not present

## 2024-05-17 DIAGNOSIS — Z6826 Body mass index (BMI) 26.0-26.9, adult: Secondary | ICD-10-CM | POA: Diagnosis not present

## 2024-06-03 ENCOUNTER — Ambulatory Visit: Payer: Self-pay

## 2024-06-03 ENCOUNTER — Ambulatory Visit (INDEPENDENT_AMBULATORY_CARE_PROVIDER_SITE_OTHER): Admitting: Internal Medicine

## 2024-06-03 VITALS — BP 124/64 | HR 93 | Temp 98.0°F | Ht 69.0 in | Wt 182.6 lb

## 2024-06-03 DIAGNOSIS — E785 Hyperlipidemia, unspecified: Secondary | ICD-10-CM | POA: Diagnosis not present

## 2024-06-03 DIAGNOSIS — R457 State of emotional shock and stress, unspecified: Secondary | ICD-10-CM | POA: Diagnosis not present

## 2024-06-03 DIAGNOSIS — R450 Nervousness: Secondary | ICD-10-CM | POA: Diagnosis not present

## 2024-06-03 DIAGNOSIS — R079 Chest pain, unspecified: Secondary | ICD-10-CM | POA: Diagnosis not present

## 2024-06-03 DIAGNOSIS — R0681 Apnea, not elsewhere classified: Secondary | ICD-10-CM | POA: Diagnosis not present

## 2024-06-03 DIAGNOSIS — G4733 Obstructive sleep apnea (adult) (pediatric): Secondary | ICD-10-CM | POA: Diagnosis not present

## 2024-06-03 DIAGNOSIS — J3489 Other specified disorders of nose and nasal sinuses: Secondary | ICD-10-CM

## 2024-06-03 DIAGNOSIS — R002 Palpitations: Secondary | ICD-10-CM | POA: Diagnosis not present

## 2024-06-03 NOTE — Patient Instructions (Addendum)
 After Visit Summary Date of Visit: June 03, 2024 Hello, Thank you for coming in today. It was good to talk with you about the stressful event you experienced this morning. Our main goal is to figure out what caused it and create a plan to prevent it from happening again. This summary covers everything we discussed. Visit Summary You came to the office today because you woke up with chest tightness, a very fast heart rate, and a feeling of panic. We reviewed your symptoms and the information from the EMS team, including their normal EKG test. Our discussion and exam suggest that this scary episode was not a heart attack. Instead, it appears to be a flare-up of your untreated sleep apnea, which then triggered a panic response. We will focus on treating the root cause--the sleep apnea--to help you feel better and more secure. Your Test Results EKG (from EMS): This test, which checks your heart's electrical activity, was reported as normal. This is very reassuring and tells us  your heart rhythm was stable. Blood Tests (from 08/26/2023): We reviewed your recent lab work. It showed your cholesterol is high. Your bad cholesterol (LDL) was 130, and the goal is under 100. We will work on this with lifestyle changes. Your Conditions and Treatment Plan We are managing a few connected issues. Here is our plan for each one. 1. Sleep Apnea (Stopping Breathing During Sleep) This is our top priority. Your body not getting enough oxygen during sleep likely caused your heart to race and triggered the chest tightness. Untreated sleep apnea can lead to serious long-term health problems. Your Action Plan:  Start using a CPAP machine. This is a device that provides gentle air pressure through a small mask over your nose to keep your airway open while you sleep. We have ordered one for you. Avoid alcohol completely for now. Alcohol makes sleep apnea much worse and more dangerous. It is very important to stop drinking  until your sleep apnea is well-managed with the CPAP. We discussed that caffeine does not affect sleep apnea, so your regular coffee intake is fine. 2. Blocked Nose (Nasal Obstruction) Your stuffy nose makes it harder to breathe, which can make sleep apnea worse and make it difficult to use a CPAP machine. Your Action Plan:  See a specialist. We are referring you to an Ear, Nose, and Throat (ENT) doctor to check your nose and discuss options to improve your breathing. Use nasal spray. Start using Flonase  Sensimist every day to help reduce swelling in your nose. Use saline rinses. Rinsing your nose with a simple saline solution can also help clear congestion. 3. High Cholesterol Your cholesterol levels are higher than the healthy range, which can increase your risk for heart problems over time. Your Action Plan:  Focus on a heart-healthy diet with more fruits, vegetables, and whole grains. Try to limit fried foods, processed snacks, and red meat. We will re-check your cholesterol at a future visit to see if we need to consider medication. Your Medications & Equipment New Device Ordered: CPAP Machine  What it's for: To treat your sleep apnea by helping you breathe continuously through the night. Instructions: A medical supply company will contact you to set up your machine and show you how to use it. We have recommended a ResMed P10 nasal prong mask, which is small and comfortable. Medication to Use: Flonase  Sensimist (over-the-counter)  What it's for: To reduce swelling and stuffiness in your nose. Instructions: Use 1-2 sprays in each nostril once  a day, preferably before bed. Follow-up Care Specialist Appointment: You need to see an Ear, Nose, and Throat (ENT) doctor to evaluate your nasal blockage. This is an urgent referral.  Reason: To see if a procedure can improve your breathing and help you use the CPAP machine effectively. Action: Please call their office to schedule an appointment as  soon as possible. We have sent them the referral. Our Office: Schedule a follow-up appointment with us  in 4 to 6 weeks.  Reason: We will check how you are doing with the CPAP machine, review the ENT doctor's report, and see if the plan is working. When to Seek Immediate Care Your safety is our top priority. Call 911 or go to the nearest Emergency Room if you experience: Chest pain that is crushing, spreads to your arm, neck, or jaw, and lasts more than a few minutes. Severe shortness of breath. Sudden weakness or numbness on one side of your body. Feelings of panic that you cannot control or thoughts of harming yourself. Call our office at [phone] if: You have problems getting or using your CPAP machine. Your nasal symptoms get worse. You have non-urgent questions about your treatment plan. We are here to support you. Please follow up on these steps, and do not hesitate to reach out if you have any questions.     Nasal Obstruction and Turbinate Reduction This information is provided to help you understand nasal obstruction caused by enlarged turbinates and the treatment options available with ENT specialists. Understanding Your Nasal Passages     Nasal turbinates are structures inside your nose that help warm, filter, and humidify the air you breathe. When these become enlarged (turbinate hypertrophy), they can block airflow and cause significant breathing problems.  Do You Have These Symptoms? Common Symptoms Impact on Daily Life  ? Chronic nasal congestion or stuffiness ? Difficulty breathing through your nose ? Breathing through your mouth, especially at night ? Snoring or disrupted sleep ? Recurrent sinus infections ? Reduced sense of smell or taste  ? Sleep disturbances or fatigue ? Dry mouth upon waking ? Headaches, especially in morning ? Difficulty with physical activity ? Persistent use of nasal sprays with limited relief ? Decreased quality of life   Common  Causes    Allergies (seasonal or year-round)    Environmental irritants (smoke, pollution)    Chronic sinusitis    Anatomical variations    Medication side effects When Medical Management Isn't Enough First-Line Treatments Second-Line Treatments Specialist Treatments   Nasal saline irrigation  Nasal steroid sprays  Antihistamines  Allergy management  Environmental controls   Prescription nasal sprays  Oral steroids (short course)  Immunotherapy  Extended allergy testing  Sleep positioning aids   Nasal endoscopy evaluation  Coblation turbinate reduction  Other turbinate procedures  Septoplasty (if needed)  Complete airway assessment   About Coblation Turbinate Reduction PROCEDURE OVERVIEW: Coblation Turbinate Reduction is a minimally invasive procedure performed by ENT specialists to reduce enlarged nasal turbinates. Unlike traditional surgery, coblation uses low-temperature radiofrequency energy with saline to gently reduce tissue size while preserving the important functions of the turbinates.    Benefits Patients Typically Experience Benefit What to Realistically Expect  Improved Nasal Breathing Most patients report significant improvement in their ability to breathe through the nose, especially at night. The degree of improvement varies by individual, with many experiencing 70-90% improvement in nasal airflow.  Better Sleep Quality Patients often report reduced snoring, less nighttime awakenings, and feeling more rested in the morning.  Partners frequently notice the difference in sleep breathing patterns.  Reduced Medication Dependence Many patients are able to reduce their reliance on nasal sprays and decongestants. Some may still need occasional use during allergy seasons or upper respiratory infections.  Improved Exercise Tolerance Better nasal breathing often translates to improved comfort during physical activity. Patients frequently report they no longer need to gasp for  air through their mouth during exercise.  Decreased Sinus Pressure Patients with chronic sinus pressure or fullness often experience relief as the improved airflow allows better sinus drainage. This may reduce the frequency of sinus infections for some patients.  Convenience Factors The procedure is typically performed in-office under local anesthesia, takes only 10-15 minutes, and most patients return to normal activities within 1-2 days. No external incisions are made.  Patient Experiences     For years, I couldn't breathe through my nose, especially at night. After the procedure, I noticed improvement within a couple of weeks. Now I can actually sleep without a mouth guard and wake up without a dry throat. It wasn't a miracle cure--I still have some congestion during allergy season--but the difference in my daily quality of life has been substantial. -- Patient, 41, procedure performed 14 months ago       The procedure itself was quick and only mildly uncomfortable. Recovery was easier than I expected--just some stuffiness for a few days. The biggest change has been during exercise. I can finally breathe through my nose while running, which has made my workouts much more comfortable. -- Patient, 73, procedure performed 8 months ago  Timeline of Improvement First Week Initial healing phase. Some congestion and mild discomfort is normal. Patients may not notice significant breathing improvement yet.  2-3 Weeks Most patients begin to notice improved breathing as initial swelling subsides. This is when many first experience the benefits of the procedure.  1-2 Months Continued improvement as internal healing completes. The majority of patients experience their maximum benefit during this period.  Long-term Benefits typically last several years. Some patients may experience gradual return of symptoms over time, particularly if allergies or other underlying causes are not well controlled.  What to  Expect After Referral Initial ENT Visit Evaluation Procedure (if appropriate) Follow-up   Discussion of symptoms  Medical history review  Physical examination  Nasal endoscopy   Assessment of turbinate size  Airflow evaluation  Rule out other causes  Treatment options discussed   Local anesthesia  Brief in-office procedure  Minimal discomfort  Same-day return home   1-2 week checkup  Assessment of improvement  Additional care if needed  Long-term management plan   Recovery Process RECOVERY EXPECTATIONS: Most patients experience mild congestion for a few days after the procedure. Nasal saline sprays and avoiding strenuous activity for 1 week are typically recommended. Full benefits may take 2-4 weeks as healing completes.  When to Consider Specialist Referral Consider ENT Referral If:   Nasal obstruction persists despite 4-6 weeks of medical therapy  Patient requires daily nasal decongestants (risk of rebound congestion)  Symptoms significantly impact sleep, daily activities, or quality of life  Physical examination shows significant turbinate hypertrophy  Patient experiences recurrent sinusitis (3+ episodes per year)  Chronic mouth breathing is causing dental or oral issues   Insurance Information Most insurance plans cover this procedure when medically necessary. Prior authorization may be required. Our office staff can help coordinate referrals and insurance verification. Patient Questions & Concerns Is the procedure painful? Most patients report minimal discomfort during and after  the procedure. Will I need to miss work? Most patients return to work within 1-2 days.[email or web address] How long do results last? Results typically last several years, though some patients may need follow-up treatment. Will my insurance cover it? Most insurance plans cover this procedure when medically necessary. Important Warning Signs CONTACT YOUR ENT SPECIALIST IMMEDIATELY IF  YOU EXPERIENCE: Heavy bleeding that doesn't stop with gentle pressure Severe pain not controlled by prescribed medication Signs of infection (fever, increasing pain, foul discharge) Difficulty breathing     REFERRAL INFORMATION   Referral Process Our office will submit the referral electronically. Please allow 3-5 business days for the ENT office to contact you for scheduling.   What to [employer] of current medications  Previous imaging results (if any)  Allergy testing results (if completed)  Diary of failed efforts to improve nasal airflow.  Designer, fashion/clothing of Otolaryngology: [email or web address]    Allergy & Asthma Network: www.allergyasthmanetwork.org    American Sleep Association: www.sleepassociation.org  ALLERGY MANAGEMENT PLAN  This plan is designed to help manage your allergic rhinitis (nasal allergies) effectively. Follow these steps daily for best results.  Sinus saline sprays- use nightly, and after sneezing episodes or exposure to allergen.  Insert deeply and spray mist into nose while leaning over sink at 45 degrees,  while gently breathing. Also blow out onto tissue while leaning forward 45 degrees. Once daily, after a sinus rinse, use sensimist.  Just before bedtime is best. This only needed if allergies acting up.  If this is inadequate add-on once daily for levocetirizine / xyzal 5 mg for nondrowsy antihistamine Take benadryl 25 mg at bedtime also if allergic mucus is persisting  When allergies cause chronic swelling in sinuses, it leads to sinus infections:    DAILY TREATMENT ROUTINE   Time of Day Treatment Steps  Morning 1. Saline Nasal Spray - Use to cleanse nasal passages 2. Xyzal (levocetirizine) - Take one tablet daily   Throughout Day Saline Nasal Spray - Use 2 additional times (mid-day and afternoon)   Evening/Bedtime 1. Saline Nasal Rinse - Thoroughly clean nasal passages 2. Flonase   Sensimist - Apply after nasal rinse 3. Benadryl (diphenhydramine) - Take 25mg  if experiencing persistent congestion    PROPER TECHNIQUE GUIDE       Saline Nasal Spray/Rinse Technique: Lean forward over sink at a 45-degree angle Turn head slightly to one side Insert spray tip into upper nostril Spray gently while breathing lightly through your nose Repeat on other side Gently blow nose to clear excess solution Use saline spray 3 times daily to keep nasal passages moist and clear allergens.       Flonase  Sensimist Technique: Shake bottle gently before each use Prime the bottle if it's new or hasn't been used for a week Tilt your head forward slightly Insert tip into nostril, pointing away from the center of your nose Spray while inhaling gently Repeat in other nostril Use Flonase  Sensimist once daily, preferably at bedtime after using saline rinse. It may take several days of regular use to feel maximum benefit.   WHY FLONASE  SENSIMIST?   Benefits of Flonase  Sensimist:  Alcohol-free and scent-free formula - gentler on sensitive nasal passages Fine mist application - more comfortable with less dripping down throat Effectively relieves nasal congestion, sneezing, runny nose, and even eye symptoms 24-hour relief with once-daily dosing Uses a more potent form of fluticasone  that works at a lower dose Less liquid per spray means less discomfort  UNDERSTANDING YOUR MEDICATIONS   Medication How It Works Important Notes  Flonase  Sensimist (fluticasone  furoate) Reduces inflammation in nasal passages, addressing the underlying cause of allergy symptoms - Takes several days for full effect - Use daily for best results - Safe for long-term use   Xyzal (levocetirizine) Blocks histamine to reduce allergy symptoms like sneezing and itching - Take at the same time each day - May cause drowsiness in some people - Once-daily dosing   Benadryl (diphenhydramine) Antihistamine that provides  additional relief for breakthrough symptoms - Causes drowsiness - Use only at bedtime - For occasional use when needed   Saline Spray/Rinse Physically removes allergens and moistens nasal passages - Safe to use frequently - Improves effectiveness of other treatments - Reduces nasal irritation    CONTACT YOUR PROVIDER IF: Your symptoms do not improve after 1-2 weeks of following this plan You develop sinus pain with fever or green/yellow discharge You experience frequent nosebleeds You develop new or worsening symptoms You have questions about your treatment plan     ADDITIONAL ALLERGY MANAGEMENT TIPS   HELPFUL STRATEGIES: ?? Keep windows closed during high pollen seasons ??? Use allergen-proof covers for pillows and mattresses ?? Vacuum regularly with a HEPA filter vacuum ?? Shower and change clothes after spending time outdoors ?? Check local pollen counts and limit outdoor time when counts are high ?? Stay well-hydrated to help keep mucous membranes moist

## 2024-06-03 NOTE — Telephone Encounter (Signed)
  FYI Only or Action Required?: FYI only for provider.  Patient was last seen in primary care on 08/26/2023 by Anthon Kins, MD. Called Nurse Triage reporting Chest Pain. Symptoms began yesterday. Interventions attempted: Nothing. Symptoms are: stable.  Triage Disposition: See Physician Within 24 Hours  Patient/caregiver understands and will follow disposition?: Yes   **Pt. Has appt. With Dr. Boston Byers today 6/20** He was wanting to be scheduled with Pulmonology; patient advised to call back on Monday as that office is closed                      Copied from CRM #[id number]. Topic: Clinical - Red Word Triage >> Jun 03, 2024 12:27 PM Corean Deutscher wrote: Red Word that prompted transfer to Nurse Triage: chest pressure, racing heart Reason for Disposition  [1] Chest pain lasts > 5 minutes AND [2] occurred > 3 days ago (72 hours) AND [3] NO chest pain or cardiac symptoms now  Answer Assessment - Initial Assessment Questions 1. LOCATION: Where does it hurt?       Pt. Awoke at 3:30 am and had pain near sternum. He is unsure if its gas related. He called EMS and they weren't able to rule out a Heart attack. He suspects sleep apnea.   2. RADIATION: Does the pain go anywhere else? (e.g., into neck, jaw, arms, back)  No    , but complains of left arm tingling.  3. ONSET: When did the chest pain begin? (Minutes, hours or days)      X 1day.   4. PATTERN: Does the pain come and go, or has it been constant since it started?  Does it get worse with exertion?    Intermittent  5. DURATION: How long does it last (e.g., seconds, minutes, hours)     6. SEVERITY: How bad is the pain?  (e.g., Scale 1-10; mild, moderate, or severe)    - MILD (1-3): doesn't interfere with normal activities     - MODERATE (4-7): interferes with normal activities or awakens from sleep    - SEVERE (8-10): excruciating pain, unable to do any normal activities        7. CARDIAC RISK FACTORS: Do you  have any history of heart problems or risk factors for heart disease? (e.g., angina, prior heart attack; diabetes, high blood pressure, high cholesterol, smoker, or strong family history of heart disease)     High cholesterol  8. PULMONARY RISK FACTORS: Do you have any history of lung disease?  (e.g., blood clots in lung, asthma, emphysema, birth control pills)     No 9. CAUSE: What do you think is causing the chest pain?     Unknown  10. OTHER SYMPTOMS: Do you have any other symptoms? (e.g., dizziness, nausea, vomiting, sweating, fever, difficulty breathing, cough)       No  He denies the pain now during the call, the arm sensation has dissipated  as well. No dx. Of anxiety or panic attacks. Taking deep breaths alleviate the symptoms.  Protocols used: Chest Pain-A-AH

## 2024-06-03 NOTE — Telephone Encounter (Signed)
 Pt is seeing pcp at 340 today

## 2024-06-03 NOTE — Progress Notes (Unsigned)
 ==============================  [facility]: [phone]   -- Medical Office Visit --  Patient: Leroy Griffith      Age: 38 y.o.       Sex:  male  Date:   06/03/2024 Today's Healthcare Provider: Bernardino KANDICE Cone, MD  ==============================   Chief Complaint: Stress (Pt states woke up out of sleep with tightness felt like heart rate was up in a panic at 330am this morning. Pt wife call 911 to come and check him out they got there did a EKG it was normal pt has picture of EKG with him his vitals was the following - bp 138/76 ems came to house 98 o2 113HR this was 330am.. pt states he is under some stress at this time with family. He is little emotional today at the office with everything going on. States he has history of sleep apena but has not been treated for it. )   Discussed the use of AI scribe software for clinical note transcription with the patient, who gave verbal consent to proceed.  History of Present Illness Kayla Swan Zayed is a 38 year old male with untreated sleep apnea who presents with chest tightness and anxiety after waking from sleep.  He woke up approximately twelve hours ago at 3:30 AM with a sensation of tightness in his chest, described as a dull pressure in the sternum area. He also experienced an elevated heart rate and anxiety, which he believes may have exacerbated his heart rate further. No shortness of breath, cold sweats, or nausea, and his skin color remained normal. The chest pain is mild and localized to the mid-chest area.  He has a history of untreated mild sleep apnea, diagnosed through an in-home sleep study conducted two years ago. His wife reports that he sometimes sounds like he is swallowing his tongue or choking during sleep, regardless of his sleeping position. He has not been using a CPAP machine. His father and uncle have sleep apnea and use CPAPs.  He consumed two beers the night before the event. He  typically drinks bourbon or wine and does not usually consume beer. He also had a prebiotic soda with dinner, which he speculates might have contributed to feeling gassy.  He has a history of nasal issues, including a possible deviated septum and fluctuating nasal obstruction, which he attributes to a past injury from a football helmet. He has used Flonase  in the past but has not kept up with it. The sensation of nasal obstruction shifts between nostrils.  His family history includes his father and uncle having sleep apnea. His social history includes being an Insurance account manager and managing stress at home due to his son's anxiety and his wife's OCD and anxiety.  During the review of symptoms, he denies experiencing shortness of breath, cold sweats, or nausea. He reports feeling anxious throughout the day and notes that his heart rate was elevated earlier but has since decreased.    {{No specialty comments available.:1}{ Problem List as of 06/03/2024 Reviewed: 10/26/2023 11:01 AM by Marlo Corina PARAS, CMA    Seasonal allergies   Mild sleep apnea   Last Assessment & Plan 08/26/2023 Office Visit Written 08/26/2023 10:11 AM by Cone Bernardino KANDICE, MD  He was previously diagnosed with mild sleep apnea but is currently asymptomatic. We advise him to use Snorlab and other tools for nightly tracking.      Lesion of external auditory canal, left   Last Assessment & Plan 08/26/2023  Office Visit Written 08/26/2023 10:11 AM by Jesus Bernardino MATSU, MD  During the physical exam, a wart was identified in his left ear. We will refer him to an Ear, Nose, and Throat specialist for removal.Dry ear wax was noted during the physical exam. We recommend the occasional use of oil droplets to prevent skin from cracking.      History of right knee surgery   Family history of prostate cancer   Last Assessment & Plan 08/26/2023 Office Visit Written 08/26/2023 10:11 AM by Jesus Bernardino MATSU, MD  His maternal grandfather was diagnosed with  prostate cancer in his 20s. We will consider PSA screening in the future, especially if the onset of his grandfather's disease was early.     :1}}  Updated Problem List Entries: No problems updated.  Background Reviewed: Problem List: has Lesion of external auditory canal, left; Seasonal allergies; History of right knee surgery; Family history of prostate cancer; and Mild sleep apnea on their problem list. Past Medical History:  has a past medical history of Acute medial meniscus tear of right knee, Allergy, GERD (gastroesophageal reflux disease), Rupture of anterior cruciate ligament of right knee, and Sleep apnea. Past Surgical History:   has a past surgical history that includes Anterior cruciate ligament repair (Right, 01/07/2022). Social History:   reports that he has never smoked. He has never used smokeless tobacco. He reports current alcohol use. He reports that he does not use drugs. Family History:  family history includes Alcohol abuse in his paternal grandfather; Arthritis in his maternal grandmother; Cancer in his maternal grandfather and maternal grandmother; Depression in his father; Early death in his father; Hearing loss in his paternal grandmother; Mental illness in his father. Allergies:  is allergic to corylus.   Medication Reconciliation: Current Outpatient Medications on File Prior to Visit  Medication Sig   benzonatate  (TESSALON ) 100 MG capsule Take 1 capsule (100 mg total) by mouth 3 (three) times daily as needed for cough.   brompheniramine-pseudoephedrine-DM 30-2-10 MG/5ML syrup Take 5 mLs by mouth 4 (four) times daily as needed.   calcium carbonate (TUMS - DOSED IN MG ELEMENTAL CALCIUM) 500 MG chewable tablet Chew by mouth as needed.   cetirizine (ZYRTEC) 10 MG tablet Take by mouth.   fluticasone  (FLONASE ) 50 MCG/ACT nasal spray Place 2 sprays into both nostrils daily.   loratadine (CLARITIN) 10 MG tablet Take by mouth daily as needed.   PARoxetine (PAXIL) 10 MG  tablet Take 10 mg by mouth daily.   tadalafil (CIALIS) 5 MG tablet Take by mouth daily as needed.   No current facility-administered medications on file prior to visit.  There are no discontinued medications.   Physical Exam:    06/03/2024    3:43 PM 10/26/2023   10:57 AM 08/26/2023    8:02 AM  Vitals with BMI  Height 5' 9 5' 9 5' 9  Weight 182 lbs 10 oz 184 lbs 185 lbs 3 oz  BMI 26.95 27.16 27.34  Systolic 124 115 889  Diastolic 64 90 70  Pulse 93 82 74  Vital signs reviewed.  Nursing notes reviewed. Weight trend reviewed. Physical Exam General Appearance:  No acute distress appreciable.   Well-groomed, healthy-appearing male.  Well proportioned with no abnormal fat distribution.  Good muscle tone. Pulmonary:  Normal work of breathing at rest, no respiratory distress apparent. SpO2: 97 %  Musculoskeletal: All extremities are intact.  Neurological:  Awake, alert, oriented, and engaged.  No obvious focal neurological deficits or cognitive impairments.  Sensorium seems unclouded.   Speech is clear and coherent with logical content. Psychiatric:  Appropriate mood, pleasant and cooperative demeanor, thoughtful and engaged during the exam Physical Exam VITALS: P- 93 ***  Results:    08/26/2023   10:47 AM 08/26/2023    8:09 AM  PHQ 2/9 Scores  PHQ - 2 Score 0 1  PHQ- 9 Score  3   Results DIAGNOSTIC Sleep study: Mild sleep apnea (10/24/2021) EKG: Sinus tachycardia (06/03/2024)  {Insert previous labs (optional):23779} {See past labs  Heme  Chem  Endocrine  Serology  Results Review (optional):1} No results found for any visits on 06/03/24. Office Visit on 08/26/2023  Component Date Value Ref Range Status   WBC 08/26/2023 4.6  4.0 - 10.5 K/uL Final   RBC 08/26/2023 5.23  4.22 - 5.81 Mil/uL Final   Hemoglobin 08/26/2023 14.4  13.0 - 17.0 g/dL Final   HCT 90/88/7975 44.9  39.0 - 52.0 % Final   MCV 08/26/2023 85.8  78.0 - 100.0 fl Final   MCHC 08/26/2023 32.1  30.0 - 36.0  g/dL Final   RDW 90/88/7975 15.2  11.5 - 15.5 % Final   Platelets 08/26/2023 244.0  150.0 - 400.0 K/uL Final   Neutrophils Relative % 08/26/2023 51.1  43.0 - 77.0 % Final   Lymphocytes Relative 08/26/2023 33.3  12.0 - 46.0 % Final   Monocytes Relative 08/26/2023 9.6  3.0 - 12.0 % Final   Eosinophils Relative 08/26/2023 4.3  0.0 - 5.0 % Final   Basophils Relative 08/26/2023 1.7  0.0 - 3.0 % Final   Neutro Abs 08/26/2023 2.4  1.4 - 7.7 K/uL Final   Lymphs Abs 08/26/2023 1.5  0.7 - 4.0 K/uL Final   Monocytes Absolute 08/26/2023 0.4  0.1 - 1.0 K/uL Final   Eosinophils Absolute 08/26/2023 0.2  0.0 - 0.7 K/uL Final   Basophils Absolute 08/26/2023 0.1  0.0 - 0.1 K/uL Final   Sodium 08/26/2023 138  135 - 145 mEq/L Final   Potassium 08/26/2023 4.5  3.5 - 5.1 mEq/L Final   Chloride 08/26/2023 103  96 - 112 mEq/L Final   CO2 08/26/2023 29  19 - 32 mEq/L Final   Glucose, Bld 08/26/2023 96  70 - 99 mg/dL Final   BUN 90/88/7975 16  6 - 23 mg/dL Final   Creatinine, Ser 08/26/2023 1.02  0.40 - 1.50 mg/dL Final   Total Bilirubin 08/26/2023 0.4  0.2 - 1.2 mg/dL Final   Alkaline Phosphatase 08/26/2023 49  39 - 117 U/L Final   AST 08/26/2023 20  0 - 37 U/L Final   ALT 08/26/2023 21  0 - 53 U/L Final   Total Protein 08/26/2023 7.3  6.0 - 8.3 g/dL Final   Albumin 90/88/7975 4.3  3.5 - 5.2 g/dL Final   GFR 90/88/7975 93.28  >60.00 mL/min Final   Calcium 08/26/2023 9.9  8.4 - 10.5 mg/dL Final   Cholesterol 90/88/7975 207 (H)  0 - 200 mg/dL Final   Triglycerides 90/88/7975 79.0  0.0 - 149.0 mg/dL Final   HDL 90/88/7975 60.90  >39.00 mg/dL Final   VLDL 90/88/7975 15.8  0.0 - 40.0 mg/dL Final   LDL Cholesterol 08/26/2023 130 (H)  0 - 99 mg/dL Final   Total CHOL/HDL Ratio 08/26/2023 3   Final   NonHDL 08/26/2023 146.11   Final   Hepatitis C Ab 08/26/2023 NON-REACTIVE  NON-REACTIVE Final   HIV 1&2 Ab, 4th Generation 08/26/2023 NON-REACTIVE  NON-REACTIVE Final   TSH 08/26/2023 1.60  0.35 -  5.50 uIU/mL Final   No image results found. No results found.       Assessment & Plan Nasal obstruction  OSA (obstructive sleep apnea)   Assessment and Plan Assessment & Plan Sleep Apnea   He experienced a sleep apnea event with chest tightness and elevated heart rate, likely exacerbated by alcohol intake. Symptoms include snoring and choking during sleep, consistent with an apneic event rather than a cardiac event. Untreated sleep apnea poses long-term risks such as increased anxiety and potential cognitive decline. Alcohol can suppress the brain's oxygen sensor, leading to dangerously low oxygen levels and potentially triggering a heart attack in severe cases. A coblation procedure to reduce nasal turbinates is recommended to improve CPAP tolerance and possibly eliminate the need for CPAP. The procedure is safe, with minimal bleeding and a 70-90% improvement in symptoms. Addressing nasal obstruction is crucial for effective management. Refer to ENT for the coblation procedure. Prescribe a CPAP machine with ResMed P10 nasal prong. Advise against alcohol consumption until sleep apnea is managed. Recommend using Flonase  Sensimist daily before bedtime and instruct on saline rinses to manage allergies and reduce nasal swelling.  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 This contributes to his sleep apnea. A coblation procedure to reduce the turbinates is recommended to improve airflow and CPAP tolerance. The procedure is minimally invasive, with a high success rate in reducing symptoms. Refer to ENT for evaluation and potential coblation procedure. Recommend daily use of Flonase  Sensimist to reduce nasal swelling and instruct on saline rinses to manage nasal congestion.  Chest Pain   He experienced mild chest pain localized to the sternum area, associated with a sleep apnea event and  subsequent panic attack. The pain was not accompanied by symptoms typical of a cardiac event, such as radiating pain, cold sweats, or nausea. Reassure that the chest pain is not cardiac in origin.  General Health Maintenance   He inquired about the impact of caffeine and alcohol on sleep apnea. Caffeine does not impact sleep apnea, but alcohol can exacerbate it. Advise against alcohol consumption until sleep apnea is managed and reassure that caffeine intake is not a concern for sleep apnea management.  Follow-up   He requires follow-up for sleep apnea management and nasal obstruction. Addressing nasal obstruction is crucial for effective management. Schedule follow-up after ENT evaluation and CPAP initiation.  Recording duration: 28 minutes          Orders Placed in Encounter:   Lab Orders  No laboratory test(s) ordered today   Imaging Orders  No imaging studies ordered today   Referral Orders  No referral(s) requested today   No orders of the defined types were placed in this encounter.    No orders of the defined types were placed in this encounter.  ED Discharge Orders     None         This document was synthesized by artificial intelligence (Abridge) using HIPAA-compliant recording of the clinical interaction;   We discussed the use of AI scribe software for clinical note transcription with the patient, who gave verbal consent to proceed. additional Info: This encounter employed state-of-the-art, real-time, collaborative documentation. The patient actively reviewed and assisted in updating their electronic medical record on a shared screen, ensuring transparency and facilitating joint problem-solving for the problem list, overview, and plan. This approach promotes accurate, informed care. The treatment plan was discussed and reviewed  in detail, including medication safety, potential side effects, and all patient questions. We confirmed understanding and comfort with the  plan. Follow-up instructions were established, including contacting the office for any concerns, returning if symptoms worsen, persist, or new symptoms develop, and precautions for potential emergency department visits.

## 2024-06-05 DIAGNOSIS — J3489 Other specified disorders of nose and nasal sinuses: Secondary | ICD-10-CM | POA: Insufficient documentation

## 2024-06-05 NOTE — Assessment & Plan Note (Addendum)
 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 Effective CPAP therapy is dependent on nasal patency. To address this and optimize the likelihood of successful CPAP adherence, I am coordinating specialized care with an urgent referral to ENT for evaluation. This consultation will assess his candidacy for procedures such as turbinate reduction, which could significantly improve therapeutic outcomes. In the interim, I am actively managing this condition by instructing the patient to resume daily Flonase  Sensimist and initiate saline nasal rinses to reduce mucosal inflammation.

## 2024-06-05 NOTE — Assessment & Plan Note (Addendum)
 Obstructive Sleep Apnea (OSA), Untreated, with Acute Exacerbation This established chronic condition is currently unmanaged and presented with an acute exacerbation, manifesting as nocturnal choking, significant tachycardia (HR 113 noted by EMS), and chest tightness. This event highlights the immediate health risks posed by his untreated mild OSA, which was diagnosed via a sleep study in 2022. I have evaluated the significant long-term risks of untreated OSA, including hypertension, cardiac arrhythmias, and adverse neurocognitive effects, and discussed these with the patient. Active management is medically necessary to mitigate these risks. Therefore, I am initiating prescription drug management by ordering a home CPAP machine with an auto-titrating setting. This intervention is critical for preventing future, potentially more severe, apneic events. The patient was counseled on the importance of adherence and advised to abstain from alcohol, as it can worsen respiratory depression during sleep and increase the risk of complications.

## 2024-06-06 ENCOUNTER — Encounter: Payer: Self-pay | Admitting: Internal Medicine

## 2024-06-07 ENCOUNTER — Encounter (INDEPENDENT_AMBULATORY_CARE_PROVIDER_SITE_OTHER): Payer: Self-pay | Admitting: Otolaryngology

## 2024-06-07 ENCOUNTER — Ambulatory Visit (INDEPENDENT_AMBULATORY_CARE_PROVIDER_SITE_OTHER): Admitting: Otolaryngology

## 2024-06-07 VITALS — BP 131/66 | HR 84

## 2024-06-07 DIAGNOSIS — J342 Deviated nasal septum: Secondary | ICD-10-CM | POA: Diagnosis not present

## 2024-06-07 DIAGNOSIS — T17308A Unspecified foreign body in larynx causing other injury, initial encounter: Secondary | ICD-10-CM

## 2024-06-07 DIAGNOSIS — R0981 Nasal congestion: Secondary | ICD-10-CM | POA: Diagnosis not present

## 2024-06-07 DIAGNOSIS — G4733 Obstructive sleep apnea (adult) (pediatric): Secondary | ICD-10-CM

## 2024-06-07 DIAGNOSIS — R0989 Other specified symptoms and signs involving the circulatory and respiratory systems: Secondary | ICD-10-CM

## 2024-06-07 DIAGNOSIS — R0683 Snoring: Secondary | ICD-10-CM

## 2024-06-07 DIAGNOSIS — J3489 Other specified disorders of nose and nasal sinuses: Secondary | ICD-10-CM

## 2024-06-07 DIAGNOSIS — J343 Hypertrophy of nasal turbinates: Secondary | ICD-10-CM

## 2024-06-07 DIAGNOSIS — J351 Hypertrophy of tonsils: Secondary | ICD-10-CM

## 2024-06-07 DIAGNOSIS — J3089 Other allergic rhinitis: Secondary | ICD-10-CM

## 2024-06-07 NOTE — Telephone Encounter (Signed)
 Faxed over like pt ask

## 2024-06-07 NOTE — Progress Notes (Signed)
 ENT CONSULT:  Reason for Consult: OSA and concern for chronic nasal obstruction  HPI: Discussed the use of AI scribe software for clinical note transcription with the patient, who gave verbal consent to proceed.  History of Present Illness Leroy Griffith is a 38 year old male with hx of mild sleep apnea who presents with episodes of choking in his sleep and nasal obstruction. He was referred by Dr. Jesus for evaluation of nasal obstruction contributing to sleep apnea.  He experiences episodes of choking and rattling noises during sleep, described by his wife as sounding like he is 'swallowing his tongue.' These episodes are associated with sleep apnea, diagnosed via a home sleep study indicating mild severity. On Friday morning, he awoke with chest pressure and an elevated heart rate, suspecting he had not been breathing for a couple of minutes. Home sleep study showed mild sleep apnea.  He uses saline nasal spray in the afternoon and Flonase  Sensimist at bedtime to manage nasal obstruction. Additionally, he takes Zyrtec in the morning for allergies, alternating with Claritin when he runs out. He has a prescription for CPAP therapy but has not yet started using it.   Records Reviewed:  Dr Jesus  DIAGNOSTIC Sleep study: Mild sleep apnea (10/24/2021) EKG: Sinus tachycardia (06/03/2024)  Chronic nasal obstruction    Past Medical History:  Diagnosis Date   Acute medial meniscus tear of right knee    Allergy    GERD (gastroesophageal reflux disease)    Rupture of anterior cruciate ligament of right knee    Sleep apnea     Past Surgical History:  Procedure Laterality Date   ANTERIOR CRUCIATE LIGAMENT REPAIR Right 01/07/2022   Procedure: RIGHT KNEE ANTERIOR CRUCIATE LIGAMENT RECONSTRUCTION QUAD AUTOGRAFT, PARTIAL MEDIAL MENISCECTOMY;  Surgeon: Addie Cordella Hamilton, MD;  Location: [facility] OR;  Service: Orthopedics;  Laterality: Right;    Family History  Problem Relation Age of Onset    Early death Father    Depression Father    Mental illness Father    Cancer Maternal Grandmother    Arthritis Maternal Grandmother    Cancer Maternal Grandfather    Hearing loss Paternal Grandmother    Alcohol abuse Paternal Grandfather     Social History:  reports that he has never smoked. He has never used smokeless tobacco. He reports current alcohol use. He reports that he does not use drugs.  Allergies:  Allergies  Allergen Reactions   Corylus Swelling    Medications: I have reviewed the patient's current medications.  The PMH, PSH, Medications, Allergies, and SH were reviewed and updated.  ROS: Constitutional: Negative for fever, weight loss and weight gain. Cardiovascular: Negative for chest pain and dyspnea on exertion. Respiratory: Is not experiencing shortness of breath at rest. Gastrointestinal: Negative for nausea and vomiting. Neurological: Negative for headaches. Psychiatric: The patient is not nervous/anxious  Blood pressure 131/66, pulse 84, SpO2 96%. There is no height or weight on file to calculate BMI.  PHYSICAL EXAM:  Exam: General: Well-developed, well-nourished Respiratory Respiratory effort: Equal inspiration and expiration without stridor Cardiovascular Peripheral Vascular: Warm extremities with equal color/perfusion Eyes: No nystagmus with equal extraocular motion bilaterally Neuro/Psych/Balance: Patient oriented to person, place, and time; Appropriate mood and affect; Gait is intact with no imbalance; Cranial nerves I-XII are intact Head and Face Inspection: Normocephalic and atraumatic without mass or lesion Palpation: Facial skeleton intact without bony stepoffs Salivary Glands: No mass or tenderness Facial Strength: Facial motility symmetric and full bilaterally ENT Pinna: External ear intact  and fully developed External canal: Canal is patent with intact skin Tympanic Membrane: Clear and mobile External Nose: No scar or anatomic  deformity Internal Nose: Septum is deviated to the left. No polyp, or purulence. Mucosal edema and erythema present.  Bilateral inferior turbinate hypertrophy.  Lips, Teeth, and gums: Mucosa and teeth intact and viable TMJ: No pain to palpation with full mobility Oral cavity/oropharynx: No erythema or exudate, no lesions present 2+ tonsils Friedman I tongue position  Nasopharynx: No mass or lesion with intact mucosa Hypopharynx: Intact mucosa without pooling of secretions Larynx Glottic: Full true vocal cord mobility without lesion or mass Supraglottic: Normal appearing epiglottis and AE folds Interarytenoid Space: Moderate pachydermia&edema Subglottic Space: Patent without lesion or edema Neck Neck and Trachea: Midline trachea without mass or lesion Thyroid: No mass or nodularity Lymphatics: No lymphadenopathy  Procedure: Preoperative diagnosis: OSA and choking episodes   Postoperative diagnosis:   Same  Procedure: Flexible fiberoptic laryngoscopy  Surgeon: Elena Larry, MD  Anesthesia: Topical lidocaine  and Afrin Complications: None Condition is stable throughout exam  Indications and consent:  The patient presents to the clinic with above symptoms. Indirect laryngoscopy view was incomplete. Thus it was recommended that they undergo a flexible fiberoptic laryngoscopy. All of the risks, benefits, and potential complications were reviewed with the patient preoperatively and verbal informed consent was obtained.  Procedure: The patient was seated upright in the clinic. Topical lidocaine  and Afrin were applied to the nasal cavity. After adequate anesthesia had occurred, I then proceeded to pass the flexible telescope into the nasal cavity. The nasal cavity was patent without rhinorrhea or polyp. The nasopharynx was also patent without mass or lesion. The base of tongue was visualized and was normal. There were no signs of pooling of secretions in the piriform sinuses. The true  vocal folds were mobile bilaterally. There were no signs of glottic or supraglottic mucosal lesion or mass. There was moderate interarytenoid pachydermia and post cricoid edema. The telescope was then slowly withdrawn and the patient tolerated the procedure throughout.    PROCEDURE NOTE: nasal endoscopy  Preoperative diagnosis: chronic nasal congestion symptoms  Postoperative diagnosis: same  Procedure: Diagnostic nasal endoscopy (68768)  Surgeon: Elena Larry, M.D.  Anesthesia: Topical lidocaine  and Afrin  H&P REVIEW: The patient's history and physical were reviewed today prior to procedure. All medications were reviewed and updated as well. Complications: None Condition is stable throughout exam Indications and consent: The patient presents with symptoms of chronic sinusitis not responding to previous therapies. All the risks, benefits, and potential complications were reviewed with the patient preoperatively and informed consent was obtained. The time out was completed with confirmation of the correct procedure.   Procedure: The patient was seated upright in the clinic. Topical lidocaine  and Afrin were applied to the nasal cavity. After adequate anesthesia had occurred, the rigid nasal endoscope was passed into the nasal cavity. The nasal mucosa, turbinates, septum, and sinus drainage pathways were visualized bilaterally. This revealed no purulence or significant secretions that might be cultured. There were no polyps or sites of significant inflammation. The mucosa was intact and there was no crusting present. The scope was then slowly withdrawn and the patient tolerated the procedure well. There were no complications or blood loss.   Assessment/Plan: Encounter Diagnoses  Name Primary?   Nasal septal deviation    Hypertrophy of both inferior nasal turbinates    Snoring    Obstructive sleep apnea Yes   Tonsillar hypertrophy    Chronic nasal congestion  Environmental and  seasonal allergies    Nasal obstruction    Choking, initial encounter [T17.308A]     Assessment and Plan Assessment & Plan Obstructive Sleep Apnea Mild obstructive sleep apnea confirmed with HST. CPAP therapy prescribed, not initiated. Tonsils not significantly obstructive on exam, tongue position is Friedman I. Home sleep studies may underestimate severity and we discussed in-lab testing. We discussed that his sx are likely 2/2 OSA and will improve once he initiates CPAP. Flexible scope exam without masses or lesions. - Initiate CPAP therapy. - Consider in-lab sleep study in the future   Chronic Nasal Obstruction and Nasal congestion Nasal endoscopy today with deviated septum and narrower left nasal passage. Turbinates not significantly enlarged. Current management includes saline spray, Flonase  Sensimist, and Zyrtec or Claritin. Nasal obstruction may affect CPAP tolerance. - Continue saline spray, Flonase  Sensimist, and Zyrtec or Claritin. - Consider surgical intervention if CPAP is not tolerated.   Thank you for allowing me to participate in the care of this patient. Please do not hesitate to contact me with any questions or concerns.   Elena Larry, MD Otolaryngology [facility] Phone: [phone] Fax: [phone]    06/07/2024, 1:53 PM

## 2024-06-10 ENCOUNTER — Telehealth: Payer: Self-pay | Admitting: Internal Medicine

## 2024-06-10 NOTE — Telephone Encounter (Signed)
 Returned pt call; no information given in previous message for [facility] as far as fax, phone contact to send mew prescription/DME order; lvm with office cb number to get contact info for [facility].

## 2024-06-10 NOTE — Telephone Encounter (Signed)
 Got message from patient and faxed new prescription, OV notes from last visit and also previous notes from [place] patient sent prev in MyChart to [facility] as requested.

## 2024-06-10 NOTE — Addendum Note (Signed)
 Addended by: Michaelangelo Mittelman G on: 06/10/2024 02:47 PM   Modules accepted: Orders

## 2024-06-10 NOTE — Telephone Encounter (Unsigned)
 Copied from CRM [id number]. Topic: General - Other >> Jun 10, 2024  4:16 PM Gennette ORN wrote: Reason for CRM: Patient has the information that [place] is requesting the name of the place is  [facility] [phone] [address] [place], [place] [address] Fax Number [phone]

## 2024-06-10 NOTE — Telephone Encounter (Unsigned)
 Copied from CRM [id number]. Topic: Clinical - Prescription Issue >> Jun 10, 2024  9:57 AM Drema MATSU wrote: Reason for CRM: Patient stated that [facility] only received a prescription only for the cpap supplies and not the actual machine. They said that the instructions are unclear on prescription.

## 2024-06-14 ENCOUNTER — Telehealth: Payer: Self-pay

## 2024-06-14 DIAGNOSIS — Z713 Dietary counseling and surveillance: Secondary | ICD-10-CM | POA: Diagnosis not present

## 2024-06-14 DIAGNOSIS — Z6826 Body mass index (BMI) 26.0-26.9, adult: Secondary | ICD-10-CM | POA: Diagnosis not present

## 2024-06-14 NOTE — Telephone Encounter (Signed)
 Copied from CRM [id number]. Topic: Clinical - Prescription Issue >> Jun 14, 2024 10:28 AM Lavanda D wrote: Reason for CRM: Patient returned call and provided information for [facility].  [facility] Fax#: [phone], Phone#: [phone]  Looks like pt has already has information.

## 2024-06-24 ENCOUNTER — Telehealth: Payer: Self-pay

## 2024-06-24 NOTE — Telephone Encounter (Signed)
 Called [facility] back about Cpap rx stated they needed rx sent over to there office with the pressure setting on the prescription.

## 2024-06-24 NOTE — Telephone Encounter (Signed)
 Copied from CRM [id number]. Topic: Clinical - Prescription Issue >> Jun 24, 2024 10:12 AM Gustabo D wrote: [facility] [place] is calling  she says they sent a Fax on 06-22-24 got it back but it needs to be a prescription. For patient's CPAP machine Call back - [phone]  REVIEW AND ADVISE NEXT STEP IF ABOUT TO DO RX FOR THIS

## 2024-06-27 NOTE — Telephone Encounter (Signed)
 Rx written and faxed over to [facility] to [phone]

## 2024-07-13 DIAGNOSIS — G4733 Obstructive sleep apnea (adult) (pediatric): Secondary | ICD-10-CM | POA: Diagnosis not present

## 2024-07-21 DIAGNOSIS — Z713 Dietary counseling and surveillance: Secondary | ICD-10-CM | POA: Diagnosis not present

## 2024-07-21 DIAGNOSIS — Z6826 Body mass index (BMI) 26.0-26.9, adult: Secondary | ICD-10-CM | POA: Diagnosis not present

## 2024-08-13 DIAGNOSIS — G4733 Obstructive sleep apnea (adult) (pediatric): Secondary | ICD-10-CM | POA: Diagnosis not present

## 2024-08-18 DIAGNOSIS — Z713 Dietary counseling and surveillance: Secondary | ICD-10-CM | POA: Diagnosis not present

## 2024-08-18 DIAGNOSIS — Z6825 Body mass index (BMI) 25.0-25.9, adult: Secondary | ICD-10-CM | POA: Diagnosis not present

## 2024-08-26 ENCOUNTER — Ambulatory Visit (INDEPENDENT_AMBULATORY_CARE_PROVIDER_SITE_OTHER): Admitting: Internal Medicine

## 2024-08-26 ENCOUNTER — Encounter: Payer: BC Managed Care – PPO | Admitting: Internal Medicine

## 2024-08-26 ENCOUNTER — Encounter: Payer: Self-pay | Admitting: Internal Medicine

## 2024-08-26 ENCOUNTER — Other Ambulatory Visit: Payer: Self-pay | Admitting: Medical Genetics

## 2024-08-26 VITALS — BP 108/62 | HR 58 | Temp 97.8°F | Ht 69.0 in | Wt 181.6 lb

## 2024-08-26 DIAGNOSIS — G4733 Obstructive sleep apnea (adult) (pediatric): Secondary | ICD-10-CM | POA: Diagnosis not present

## 2024-08-26 DIAGNOSIS — D485 Neoplasm of uncertain behavior of skin: Secondary | ICD-10-CM

## 2024-08-26 DIAGNOSIS — J3489 Other specified disorders of nose and nasal sinuses: Secondary | ICD-10-CM

## 2024-08-26 DIAGNOSIS — Z23 Encounter for immunization: Secondary | ICD-10-CM | POA: Diagnosis not present

## 2024-08-26 DIAGNOSIS — E785 Hyperlipidemia, unspecified: Secondary | ICD-10-CM | POA: Diagnosis not present

## 2024-08-26 DIAGNOSIS — Z0001 Encounter for general adult medical examination with abnormal findings: Secondary | ICD-10-CM | POA: Diagnosis not present

## 2024-08-26 DIAGNOSIS — J342 Deviated nasal septum: Secondary | ICD-10-CM

## 2024-08-26 DIAGNOSIS — J302 Other seasonal allergic rhinitis: Secondary | ICD-10-CM | POA: Diagnosis not present

## 2024-08-26 LAB — CBC WITH DIFFERENTIAL/PLATELET
Basophils Absolute: 0.1 K/uL (ref 0.0–0.1)
Basophils Relative: 2.4 % (ref 0.0–3.0)
Eosinophils Absolute: 0.1 K/uL (ref 0.0–0.7)
Eosinophils Relative: 2.1 % (ref 0.0–5.0)
HCT: 45.5 % (ref 39.0–52.0)
Hemoglobin: 14.9 g/dL (ref 13.0–17.0)
Lymphocytes Relative: 37.9 % (ref 12.0–46.0)
Lymphs Abs: 1.4 K/uL (ref 0.7–4.0)
MCHC: 32.6 g/dL (ref 30.0–36.0)
MCV: 87.5 fl (ref 78.0–100.0)
Monocytes Absolute: 0.3 K/uL (ref 0.1–1.0)
Monocytes Relative: 8.3 % (ref 3.0–12.0)
Neutro Abs: 1.8 K/uL (ref 1.4–7.7)
Neutrophils Relative %: 49.3 % (ref 43.0–77.0)
Platelets: 199 K/uL (ref 150.0–400.0)
RBC: 5.2 Mil/uL (ref 4.22–5.81)
RDW: 15.2 % (ref 11.5–15.5)
WBC: 3.7 K/uL — ABNORMAL LOW (ref 4.0–10.5)

## 2024-08-26 LAB — COMPREHENSIVE METABOLIC PANEL WITH GFR
ALT: 20 U/L (ref 0–53)
AST: 21 U/L (ref 0–37)
Albumin: 4.5 g/dL (ref 3.5–5.2)
Alkaline Phosphatase: 49 U/L (ref 39–117)
BUN: 17 mg/dL (ref 6–23)
CO2: 29 meq/L (ref 19–32)
Calcium: 9.6 mg/dL (ref 8.4–10.5)
Chloride: 104 meq/L (ref 96–112)
Creatinine, Ser: 1.07 mg/dL (ref 0.40–1.50)
GFR: 87.46 mL/min (ref >60.00)
Glucose, Bld: 97 mg/dL (ref 70–99)
Potassium: 4.4 meq/L (ref 3.5–5.1)
Sodium: 139 meq/L (ref 135–145)
Total Bilirubin: 0.4 mg/dL (ref 0.2–1.2)
Total Protein: 7.2 g/dL (ref 6.0–8.3)

## 2024-08-26 LAB — LIPID PANEL
Cholesterol: 165 mg/dL (ref 0–200)
HDL: 55.9 mg/dL (ref >39.00)
LDL Cholesterol: 98 mg/dL (ref 0–99)
NonHDL: 109.4
Total CHOL/HDL Ratio: 3
Triglycerides: 59 mg/dL (ref 0.0–149.0)
VLDL: 11.8 mg/dL (ref 0.0–40.0)

## 2024-08-26 NOTE — Progress Notes (Signed)
 [facility] at [facility] [address] [place], [place] [address] Office:  [phone]  -- Annual Preventive Medical Office Visit --  Patient:  Leroy Griffith      Age: 38 y.o.       Sex:  male  Date:   08/26/2024 Patient Care Team: Jesus Bernardino MATSU, MD as PCP - General (Internal Medicine) Today's Healthcare Provider: Bernardino MATSU Jesus, MD  ========================================= Chief complaint: Annual Exam (Pt is present for cpe pt is fasting for labs if need them )  Purpose of Visit: Comprehensive preventive health assessment and personalized health maintenance planning.  This encounter was conducted as a Comprehensive Physical Exam (CPE) preventive care annual visit. The patient's medical history and problem list were reviewed to inform individualized preventive care recommendations.   No problem-specific medical treatment was provided during this visit.  Assessment & Plan Encounter for annual general medical examination with abnormal findings in adult A comprehensive review of systems was normal. Reviewed vaccinations and discussed hepatitis B and HPV vaccinations. Discussed lifestyle, including diet and exercise. He engages in regular physical activity and has access to a dietitian through work. Administer hepatitis B and HPV vaccinations. Encourage continued engagement with the dietitian and regular physical activity. Remind him to schedule eye exams and dental care. History, meds, allergies, immunizations, family and social risks reviewed; vitals and full exam documented. Counseling on nutrition, activity, sleep, mental health, sexual health, substance use, and safety. Vaccinations reconciled and updated per ACIP (influenza annually; COVID seasonal; Tdap q10y; shingles >=50; pneumococcal per age/comorbidity; Hep B as indicated). Screening plan set per USPSTF: BP/BMI each visit; lipids/diabetes per risk; colorectal per modality/interval; Labs today as indicated (CBC,  CMP, lipids, A1c, TSH if symptomatic, HIV/HCV once, urine ACR if HTN/DM, others PRN). Follow-up annually or sooner for abnormalities. OSA (obstructive sleep apnea) Nasal obstruction Seasonal allergies Deviated septum Symptoms have significantly improved with CPAP use, and current settings are effective with events per hour below five. CPAP is not without risks, particularly at high pressures, which can cause nasal irritation. Continue CPAP therapy. Consider nasal surgery if CPAP becomes intolerable or ineffective.ENT evaluation confirmed a deviated septum, but turbinates are not significantly enlarged. CPAP is effectively managing symptoms. Monitor symptoms and consider nasal surgery if CPAP becomes insufficient.He primarily uses Claritin instead of Zyrtec. Flonase  causes nasal dryness, so it is not used daily. Continue Claritin as needed and use Flonase  as tolerated. Neoplasm of uncertain behavior of skin A lesion on the temple shows slow growth, and AI analysis suggests possible basal cell carcinoma. Urgent referral to dermatology for evaluation of the temple skin lesion. Hyperlipidemia, unspecified hyperlipidemia type Previous cholesterol levels were slightly elevated. He is aware and monitoring his diet with a dietitian's help. Perform blood work to assess current cholesterol levels.  Photographs Taken 08/26/2024 :         ICD-10-CM   1. Encounter for annual general medical examination with abnormal findings in adult  Z00.01 Lipid panel    Comprehensive metabolic panel with GFR    CBC with Differential/Platelet    TSH Rfx on Abnormal to Free T4    2. OSA (obstructive sleep apnea)  G47.33     3. Nasal obstruction  J34.89     4. Seasonal allergies  J30.2     5. Neoplasm of uncertain behavior of skin  D48.5 Ambulatory referral to Dermatology    6. Hyperlipidemia, unspecified hyperlipidemia type  E78.5 Lipid panel    7. Deviated septum  J34.2  Reviewed/updated/encouraged  completion: Immunization History  Administered Date(s) Administered   Influenza Split 09/30/2017   Influenza,inj,Quad PF,6+ Mos 02/02/2019   Influenza-Unspecified 09/29/2012, 10/17/2013, 09/13/2014, 08/23/2015, 09/28/2016, 09/24/2019, 09/18/2023   MMR 07/16/1996   Pfizer Covid-19 Vaccine Bivalent Booster 47yrs & up 08/21/2021   Tdap 06/20/2007, 07/07/2011, 04/09/2018   Varicella 04/18/1996   Health Maintenance Due  Topic Date Due   Hepatitis B Vaccines 19-59 Average Risk (1 of 3 - 19+ 3-dose series) Never done   HPV VACCINES (1 - 3-dose SCDM series) Never done   Influenza Vaccine  07/15/2024  He is agreeable.    Health Maintenance  Topic Date Due   Hepatitis B Vaccines 19-59 Average Risk (1 of 3 - 19+ 3-dose series) Never done   HPV VACCINES (1 - 3-dose SCDM series) Never done   Influenza Vaccine  07/15/2024   COVID-19 Vaccine (2 - 2025-26 season) 09/11/2024 (Originally 08/15/2024)   DTaP/Tdap/Td (4 - Td or Tdap) 04/09/2028   Hepatitis C Screening  Completed   HIV Screening  Completed   Pneumococcal Vaccine  Aged Out   Meningococcal B Vaccine  Aged Out    Reviewed the following verbally with patient and provided AVS materials:   HEALTH MAINTENANCE COUNSELING AND ANTICIPATORY GUIDANCE    Preventive Measure Recommendation  Eye Exams Every 1-2 years  Dental Care Cleanings every 6 months or more, brush/floss 3x daily  Sinus Care Saline spray rinses daily  Sleep 8 hours nightly, good sleep hygiene, e-monitoring if any daytime drowsiness  Diet Fruits/vegetables/fiber/healthy fats, balance and moderation  Exercise 150 minutes weekly  Risk Behaviors Discouraged any/all high risk behaviors    CANCER SCREENING SHARED DECISION MAKING    Penile/Testicle/Scrotum Encouraged self-monitoring and reporting of genital abnormalities. Patient reports none.  Thyroid Checked and advised to palpate thyroid for nodules  Prostate Individualized risks/benefits/costs discussed No results found  for: PSA  Colon HM Colonoscopy   This patient has no relevant Health Maintenance data.     Lung Current guidelines recommend individuals aged 20 to 60 who currently smoke or formerly smoked and have a >= 20 pack-year smoking history should undergo annual screening with low-dose computed tomography (LDCT). Tobacco Use: Low Risk  (08/26/2024)   Patient History    Smoking Tobacco Use: Never    Smokeless Tobacco Use: Never    Passive Exposure: Not on file   Social History   Tobacco Use  Smoking Status Never  Smokeless Tobacco Never    Skin Advised regular sunscreen use. Patient reports a  worrisome, changing, or new skin lesions. Offered to include images in chart for surveillance. Showed patient these pictures of melanomas for reference to educate for self-monitoring.  Other Cancers Discussed lack of screening guidelines and insurance coverage for other cancer types.    Discussed the use of AI scribe software for clinical note transcription with the patient, who gave verbal consent to proceed.  History of Present Illness a 38 year old male who presents with sleep apnea management and a skin lesion on his temple.  He has a skin lesion on his temple that has been present since July. The lesion is slow-growing with white speckles. His wife suggested evaluation, prompting this visit.  He uses CPAP therapy for sleep apnea, which has significantly improved his quality of life. Initially, he faced challenges obtaining the CPAP machine, requiring multiple prescriptions over five weeks. Since starting CPAP, he reports improved sleep and reduced daytime breathing issues. He switched from a nasal mask to another type due to  discomfort, which has resolved the issue. His CPAP pressure averages between 5.5 and 7, with events per hour below five.  He uses Claritin regularly and has reduced the use of Flonase  due to nasal dryness. An ENT previously noted a deviated septum but recommended trying CPAP  before considering further interventions.  He recalls having high cholesterol in the past and is mindful of his diet, participating in a wellness plan through work that includes monthly consultations with a dietitian. He is making efforts to reduce late-night snacking and increase physical activity.  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 He uses a stand-up desk to mitigate this and engages in regular exercise, including using the Peloton app at home. He enjoys singing at church and spending time with his family.    ROS A comprehensive ROS was negative for any concerning symptoms.   Completed medication reconciliation: Current Outpatient Medications on File Prior to Visit  Medication Sig   calcium carbonate (TUMS - DOSED IN MG ELEMENTAL CALCIUM) 500 MG chewable tablet Chew by mouth as needed.   fluticasone  (FLONASE ) 50 MCG/ACT nasal spray Place 2 sprays into both nostrils daily.   loratadine (CLARITIN) 10 MG tablet Take by mouth daily as needed.   tadalafil (CIALIS) 5 MG tablet Take by mouth daily as needed.   No current facility-administered medications on file prior to visit.   Medications Discontinued During This Encounter  Medication Reason   cetirizine (ZYRTEC) 10 MG tablet   The following were reviewed and/or entered/updated into our electronic MEDICAL RECORD NUMBERPast Medical History:  Diagnosis Date   Acute medial meniscus tear of right knee    Allergy    GERD (gastroesophageal reflux disease)    Rupture of anterior cruciate ligament of right knee    Sleep apnea    Past Surgical History:  Procedure Laterality Date   ANTERIOR CRUCIATE LIGAMENT REPAIR Right 01/07/2022   Procedure: RIGHT KNEE ANTERIOR CRUCIATE LIGAMENT RECONSTRUCTION QUAD AUTOGRAFT, PARTIAL MEDIAL MENISCECTOMY;  Surgeon: Addie Cordella Hamilton, MD;  Location: [facility] OR;  Service: Orthopedics;  Laterality: Right;   Social History   Socioeconomic History   Marital  status: Married    Spouse name: Not on file   Number of children: Not on file   Years of education: Not on file   Highest education level: Master's degree (e.g., MA, MS, MEng, MEd, MSW, MBA)  Occupational History   Not on file  Tobacco Use   Smoking status: Never   Smokeless tobacco: Never  Vaping Use   Vaping status: Never Used  Substance and Sexual Activity   Alcohol use: Yes    Comment: occassionally   Drug use: Never   Sexual activity: Yes    Birth control/protection: Other-see comments, Surgical    Comment: Vasectomy  Other Topics Concern   Not on file  Social History Narrative   Not on file   Social Drivers of Health   Financial Resource Strain: Low Risk  (06/03/2024)   Overall Financial Resource Strain (CARDIA)    Difficulty of Paying Living Expenses: Not very hard  Food Insecurity: No Food Insecurity (06/03/2024)   Hunger Vital Sign    Worried About Running Out of Food in the Last Year: Never true    Ran Out of Food in the Last Year: Never true  Transportation Needs: No Transportation Needs (06/03/2024)   PRAPARE - Administrator, Civil Service (Medical): No    Lack of Transportation (Non-Medical): No  Physical Activity: Insufficiently Active (06/03/2024)   Exercise Vital Sign    Days of Exercise per Week: 3 days    Minutes of Exercise per Session: 20 min  Stress: Stress Concern Present (06/03/2024)   [employer] of Occupational Health - Occupational Stress Questionnaire    Feeling of Stress: Rather much  Social Connections: Unknown (06/03/2024)   Social Connection and Isolation Panel    Frequency of Communication with Friends and Family: Once a week    Frequency of Social Gatherings with Friends and Family: Patient declined    Attends Religious Services: More than 4 times per year    Active Member of [employer] or Organizations: Yes    Attends Banker Meetings: More than 4 times per year    Marital Status: Married  Catering manager  Violence: Unknown (03/19/2022)   Received from [facility]   HITS    Physically Hurt: Not on file    Insult or Talk Down To: Not on file    Threaten Physical Harm: Not on file    Scream or Curse: Not on file      06/03/2024   10:45 AM  Alcohol Use Disorder Test (AUDIT)  1. How often do you have a drink containing alcohol? 3  2. How many drinks containing alcohol do you have on a typical day when you are drinking? 0  3. How often do you have six or more drinks on one occasion? 0  AUDIT-C Score 3   4. How often during the last year have you found that you were not able to stop drinking once you had started? 0  5. How often during the last year have you failed to do what was normally expected from you because of drinking? 0  6. How often during the last year have you needed a first drink in the morning to get yourself going after a heavy drinking session? 0  7. How often during the last year have you had a feeling of guilt of remorse after drinking? 0  8. How often during the last year have you been unable to remember what happened the night before because you had been drinking? 0  9. Have you or someone else been injured as a result of your drinking? 0  10. Has a relative or friend or a doctor or another health worker been concerned about your drinking or suggested you cut down? 0  Alcohol Use Disorder Identification Test Final Score (AUDIT) 3      Patient-reported   Family History  Problem Relation Age of Onset   Early death Father    Depression Father    Mental illness Father    Cancer Maternal Grandmother    Arthritis Maternal Grandmother    Cancer Maternal Grandfather    Hearing loss Paternal Grandmother    Alcohol abuse Paternal Grandfather    Allergies  Allergen Reactions   Corylus Swelling   Social History   Substance and Sexual Activity  Sexual Activity Yes   Birth control/protection: Other-see comments, Surgical   Comment: Vasectomy  @    08/26/2024    8:55 AM   Depression screen PHQ 2/9  Decreased Interest 0  Down, Depressed, Hopeless 0  PHQ - 2 Score 0      08/26/2024    8:55 AM  Fall Risk   Falls in the past year? 0  Number falls in past yr: 0  Injury with Fall? 0  Risk for fall due to : No Fall  Risks  Follow up Falls evaluation completed     BP 108/62   Pulse (!) 58   Temp 97.8 F (36.6 C) (Temporal)   Ht 5' 9 (1.753 m)   Wt 181 lb 9.6 oz (82.4 kg)   SpO2 98%   BMI 26.82 kg/m  BP Readings from Last 3 Encounters:  08/26/24 108/62  06/07/24 131/66  06/03/24 124/64   Wt Readings from Last 10 Encounters:  08/26/24 181 lb 9.6 oz (82.4 kg)  06/03/24 182 lb 9.6 oz (82.8 kg)  10/26/23 184 lb (83.5 kg)  08/26/23 185 lb 3.2 oz (84 kg)  01/07/22 182 lb (82.6 kg)  12/18/21 184 lb 12.8 oz (83.8 kg)  10/11/18 165 lb (74.8 kg)  08/24/18 165 lb (74.8 kg)  07/12/18 165 lb (74.8 kg)  05/25/18 165 lb (74.8 kg)  Physical Exam  Physical Exam MUSCULOSKELETAL: Good muscle tone. SKIN: Skin healthy appearing despite freckles.  GEN: No acute distress, resting comfortably. HEENT: Tympanic membranes normal appearing bilaterally, oropharynx clear, no thyromegaly noted, no palpable lymphadenopathy or thyroid nodules. CARDIOVASCULAR: S1 and S2 heart sounds with regular rate and rhythm, no murmurs appreciated. PULMONARY: Normal work of breathing, clear to auscultation bilaterally, no crackles, wheezes, or rhonchi. ABDOMEN: Soft, nontender, nondistended. MSK: No edema, cyanosis, or clubbing noted. SKIN: Warm, dry, no lesions of concern observed. NEUROLOGICAL: Cranial nerves II-XII grossly intact, strength 5/5 in upper and lower extremities, reflexes symmetric and intact bilaterally. PSYCH: Normal affect and thought content, pleasant and cooperative.  Last CBC Lab Results  Component Value Date   WBC 3.7 (L) 08/26/2024   HGB 14.9 08/26/2024   HCT 45.5 08/26/2024   MCV 87.5 08/26/2024   MCH 30.1 01/03/2022   RDW 15.2 08/26/2024   PLT 199.0  08/26/2024   Last metabolic panel Lab Results  Component Value Date   GLUCOSE 97 08/26/2024   NA 139 08/26/2024   K 4.4 08/26/2024   CL 104 08/26/2024   CO2 29 08/26/2024   BUN 17 08/26/2024   CREATININE 1.07 08/26/2024   GFR 87.46 08/26/2024   CALCIUM 9.6 08/26/2024   PROT 7.2 08/26/2024   ALBUMIN 4.5 08/26/2024   BILITOT 0.4 08/26/2024   ALKPHOS 49 08/26/2024   AST 21 08/26/2024   ALT 20 08/26/2024   ANIONGAP 8 01/03/2022   Last lipids Lab Results  Component Value Date   CHOL 165 08/26/2024   HDL 55.90 08/26/2024   LDLCALC 98 08/26/2024   TRIG 59.0 08/26/2024   CHOLHDL 3 08/26/2024   Last hemoglobin A1c No results found for: HGBA1C Last thyroid functions Lab Results  Component Value Date   TSH 1.60 08/26/2023   Last vitamin D No results found for: 25OHVITD2, 25OHVITD3, VD25OH Last vitamin B12 and Folate No results found for: VITAMINB12, FOLATE      ======================================  IMPORTANT HEALTH REMINDERS: Report any new or changing skin lesions promptly Maintain recommended screening schedules Discuss any new family history of cancer at future visits Follow up on any new symptoms that persist more than two weeks      Notes:  This document was synthesized by artificial intelligence (Abridge) using HIPAA-compliant recording of the clinical interaction;   We discussed the use of AI scribe software for clinical note transcription with the patient, who gave verbal consent to proceed.    This encounter employed state-of-the-art, real-time, collaborative documentation. The patient was empowered to actively review and assist in updating their electronic medical record on a shared monitor, ensuring transparency and improving accuracy.  Prior to and at the beginning of Comprehensive Physical Exam (CPE) preventive care annual visit appointment types  we clarify to patients Our goal today is to focus on your preventive or annual  Comprehensive Physical Exam (CPE) preventive care annual visit, which typically covers routine screenings and overall health maintenance. However, if you share any new or concerning symptoms--such as dizziness, passing out, severe pain, or anything else that may point to a more serious issue--we are both legally and ethically required to evaluate it. We cannot simply overlook or ignore such concerns, even if you later decide you don't want to discuss them, because it could jeopardize your health.  If addressing a new concern takes us  beyond the scope of the preventive visit, we may need to bill separately for that portion of care. We understand financial considerations are important, and we're happy to discuss your options if something new comes up. However, we want to be clear that once you mention a potentially serious issue, we must investigate it; we can't ethically or legally exclude that from our records or our evaluation. Please let us  know all of your questions or worries. Together, we can decide how best to manage them and how to minimize any unexpected costs, but we want to keep you safe above all else.   This disclosure is mandated by professional ethics and legal obligations, as healthcare providers must address any substantial health concerns raised during any patient interaction and a comprehensive ROS is required by insurance companies for billing preventive-care visit type.   This disclosure ultimately discourages patients financially from reporting significant health issues.   Medical Screening Exam A medical screening exam (MSE) helps to determine whether you need immediate medical treatment relating to any number of symptoms you are having. This type of exam may be done in an emergency department, an urgent care setting, or your health care provider's office. Depending on your symptoms and severity, you may need additional tests or medical therapy. It is important to note that an MSE does  not necessarily mean that you will need or receive further medical testing or interventions if your symptoms are not deemed to be medically urgent (emergent). Tell a health care provider about: Any allergies you have. All medicines you are taking, including vitamins, herbs, eye drops, creams, and over-the-counter medicines. Any problems you or family members have had with anesthetic medicines. Any bleeding problems you have. Any surgeries you have had. Any medical conditions you have. Whether you are pregnant or may be pregnant. What happens during the test? During the exam, a health care provider does a short, often focused, physical exam and asks about your medical history to assess: Your current symptoms. Your overall health. Your need for possible further medical intervention. What can I expect after the test? If you have a regular health care provider, make an appointment for a follow-up visit with him or her. If you do not have a regular health care provider, ask about resources in your community. Your medical screening exam may determine that: You do not need emergency treatment at this time. You need treatment right away. You need to be transferred to another medical center. This may happen if you need an emergent specialist or consultant that is not available at the medical center you are at. You need to have more tests. A medical specialist may be consulted if needed. Get help right away if: Your condition gets worse. You develop new or troubling symptoms before you see your health care  provider. These symptoms may represent a serious problem that is an emergency. Do not wait to see if the symptoms will go away. Get medical help right away. Call your local emergency services (911 in the [place] Do not drive yourself to the hospital. Summary A medical screening exam helps to determine whether you need medical treatment right away. This type of exam may be done in an emergency  department, an urgent care setting, or your health care provider's office. During the exam, a health care provider does a short physical exam and asks about your current symptoms and overall health. Depending on the exam, more tests or therapies may be ordered. However, an MSE does not necessarily mean that you will have further medical testing if your symptoms are not deemed to be urgent. If you need further care that is not offered at your current medical center, you may need to be transferred to another facility. This information is not intended to replace advice given to you by your health care provider. Make sure you discuss any questions you have with your health care provider. Document Revised: 08/14/2021 Document Reviewed: 04/11/2021 Elsevier Patient Education  2024 Elsevier Inc.   Health Maintenance, Male Adopting a healthy lifestyle and getting preventive care are important in promoting health and wellness. Ask your health care provider about: The right schedule for you to have regular tests and exams. Things you can do on your own to prevent diseases and keep yourself healthy. What should I know about diet, weight, and exercise? Eat a healthy diet  Eat a diet that includes plenty of vegetables, fruits, low-fat dairy products, and lean protein. Do not eat a lot of foods that are high in solid fats, added sugars, or sodium. Maintain a healthy weight Body mass index (BMI) is a measurement that can be used to identify possible weight problems. It estimates body fat based on height and weight. Your health care provider can help determine your BMI and help you achieve or maintain a healthy weight. Get regular exercise Get regular exercise. This is one of the most important things you can do for your health. Most adults should: Exercise for at least 150 minutes each week. The exercise should increase your heart rate and make you sweat (moderate-intensity exercise). Do strengthening  exercises at least twice a week. This is in addition to the moderate-intensity exercise. Spend less time sitting. Even light physical activity can be beneficial. Watch cholesterol and blood lipids Have your blood tested for lipids and cholesterol at 39 years of age, then have this test every 5 years. You may need to have your cholesterol levels checked more often if: Your lipid or cholesterol levels are high. You are older than 39 years of age. You are at high risk for heart disease. What should I know about cancer screening? Many types of cancers can be detected early and may often be prevented. Depending on your health history and family history, you may need to have cancer screening at various ages. This may include screening for: Colorectal cancer. Prostate cancer. Skin cancer. Lung cancer. What should I know about heart disease, diabetes, and high blood pressure? Blood pressure and heart disease High blood pressure causes heart disease and increases the risk of stroke. This is more likely to develop in people who have high blood pressure readings or are overweight. Talk with your health care provider about your target blood pressure readings. Have your blood pressure checked: Every 3-5 years if you are 18-39 years of  age. Every year if you are 47 years old or older. If you are between the ages of 66 and 46 and are a current or former smoker, ask your health care provider if you should have a one-time screening for abdominal aortic aneurysm (AAA). Diabetes Have regular diabetes screenings. This checks your fasting blood sugar level. Have the screening done: Once every three years after age 26 if you are at a normal weight and have a low risk for diabetes. More often and at a younger age if you are overweight or have a high risk for diabetes. What should I know about preventing infection? Hepatitis B If you have a higher risk for hepatitis B, you should be screened for this virus. Talk  with your health care provider to find out if you are at risk for hepatitis B infection. Hepatitis C Blood testing is recommended for: Everyone born from [birth year] through 1966. Anyone with known risk factors for hepatitis C. Sexually transmitted infections (STIs) You should be screened each year for STIs, including gonorrhea and chlamydia, if: You are sexually active and are younger than 39 years of age. You are older than 39 years of age and your health care provider tells you that you are at risk for this type of infection. Your sexual activity has changed since you were last screened, and you are at increased risk for chlamydia or gonorrhea. Ask your health care provider if you are at risk. Ask your health care provider about whether you are at high risk for HIV. Your health care provider may recommend a prescription medicine to help prevent HIV infection. If you choose to take medicine to prevent HIV, you should first get tested for HIV. You should then be tested every 3 months for as long as you are taking the medicine. Follow these instructions at home: Alcohol use Do not drink alcohol if your health care provider tells you not to drink. If you drink alcohol: Limit how much you have to 0-2 drinks a day. Know how much alcohol is in your drink. In the [place] one drink equals one 12 oz bottle of beer (355 mL), one 5 oz glass of wine (148 mL), or one 1 oz glass of hard liquor (44 mL). Lifestyle Do not use any products that contain nicotine or tobacco. These products include cigarettes, chewing tobacco, and vaping devices, such as e-cigarettes. If you need help quitting, ask your health care provider. Do not use street drugs. Do not share needles. Ask your health care provider for help if you need support or information about quitting drugs. General instructions Schedule regular health, dental, and eye exams. Stay current with your vaccines. Tell your health care provider if: You often feel  depressed. You have ever been abused or do not feel safe at home. Summary Adopting a healthy lifestyle and getting preventive care are important in promoting health and wellness. Follow your health care provider's instructions about healthy diet, exercising, and getting tested or screened for diseases. Follow your health care provider's instructions on monitoring your cholesterol and blood pressure. This information is not intended to replace advice given to you by your health care provider. Make sure you discuss any questions you have with your health care provider. Document Revised: 04/22/2021 Document Reviewed: 04/22/2021 Elsevier Patient Education  2024 [employer].

## 2024-08-26 NOTE — Patient Instructions (Addendum)
 VISIT SUMMARY: During today's visit, we discussed the management of your sleep apnea, a skin lesion on your temple, and other health concerns. You reported significant improvement in your sleep apnea symptoms with CPAP therapy. We also reviewed your chronic nasal obstruction, seasonal allergies, and previous high cholesterol levels. Additionally, we conducted an adult wellness visit, including a review of your vaccinations and lifestyle habits.  YOUR PLAN: -OBSTRUCTIVE SLEEP APNEA: Obstructive sleep apnea is a condition where your breathing repeatedly stops and starts during sleep. Your symptoms have significantly improved with CPAP therapy, and your current settings are effective. Continue using your CPAP machine as directed. If CPAP becomes intolerable or ineffective, we may consider nasal surgery.  -CHRONIC NASAL OBSTRUCTION WITH DEVIATED SEPTUM: A deviated septum is when the thin wall between your nasal passages is displaced to one side, which can cause breathing difficulties. Your CPAP therapy is effectively managing your symptoms. We will monitor your condition and consider nasal surgery if CPAP becomes insufficient.  -SEASONAL ALLERGIC RHINITIS: Seasonal allergic rhinitis is an allergic reaction to pollen that causes sneezing, congestion, and a runny nose. Continue using Claritin as needed and use Flonase  as tolerated to manage your symptoms.  -TEMPLE SKIN LESION, POSSIBLE BASAL CELL CARCINOMA: Basal cell carcinoma is a type of skin cancer that often appears as a slightly transparent bump on the skin. The lesion on your temple shows slow growth and needs further evaluation. We will refer you to dermatology urgently for a thorough examination.  -HYPERLIPIDEMIA: Hyperlipidemia is when you have high levels of fats (lipids) in your blood, which can increase your risk of heart disease. We will perform blood work to assess your current cholesterol levels. Continue monitoring your diet with the help of  your dietitian.  -ADULT WELLNESS VISIT: Your comprehensive review of systems was normal. We reviewed your vaccinations and discussed hepatitis B and HPV vaccinations. You are engaging in regular physical activity and have access to a dietitian through work. We administered hepatitis B and HPV vaccinations today. Continue your regular physical activity and dietitian consultations. Remember to schedule your eye exams and dental care.  INSTRUCTIONS: Please follow up with dermatology for the evaluation of your temple skin lesion. Continue using your CPAP machine as directed and monitor your symptoms. Schedule your eye exams and dental care. We will perform blood work to assess your cholesterol levels. Continue your regular physical activity and dietitian consultations.  Building Your Long-Term Health Plan  During today's preventive visit, we covered a variety of important health checks to help you stay on top of your well-being.  We also discussed strategies to maintain your health and identified some areas that might benefit from further exploration.   Preventive care visits like today's are designed to be proactive, but sometimes additional attention may be needed.  Rest assured, we're here for you.  If these areas require further evaluation or management, we'd be happy to schedule a separate, focused appointment to address them in detail.  Addressing Next Steps  [x]   Follow-up Visit: To ensure we address any unresolved issues and continue monitoring your overall health, we recommend scheduling a follow-up appointment in 1 year for your next preventive care visit. If you experience any new problems, need to discuss any medical concerns, or your condition worsens before then, please don't hesitate to call our office to schedule an appointment or seek emergency care as needed.  [x]   Preventive Measures: Maintaining healthy habits plays a crucial role in overall wellness. We recommend considering these  tips: [x]   Regular appointments with dental and vision professionals [x]   Nightly nasal saline mist to keep sinuses clear [x]   Consistent toothbrushing to maintain oral health [x]   Using an app like SnoreLab to track sleep quality [x]   Routine checks of blood pressure and heart rate [x]   Medical Information: In some instances, we may require additional medical information from other providers to create a comprehensive picture of your health. If applicable, we can provide a medical information release form at the front desk for you to sign, allowing us  to gather these records. [x]   Lab Tests: If any lab tests were ordered today, scheduling them within a week of your visit helps ensure the best possible insurance coverage.  Planning Follow Up to Work on a Problem? Make the Most of Our Focused (20 minute) Appointments  [x]   Clearly state your top concerns at the beginning of the visit to focus our discussion [x]   If you anticipate you will need more time, please inform the front desk during scheduling - we can book multiple appointments in the same week. [x]   If you have transportation problems- use our convenient video appointments or ask about transportation support. [x]   We can get down to business faster if you use MyChart to update information before the visit and submit non-urgent questions before your visit. Thank you for taking the time to provide details through MyChart.  Let our nurse know and she can import this information into your encounter documents.  Arrival and Wait Times  [x]   Arriving on time ensures that everyone receives prompt attention. [x]   Early morning (8a) and afternoon (1p) appointments tend to have shortest wait times. [x]   Unfortunately, we cannot delay appointments for late arrivals or hold slots during phone calls.  Bring to Your Next Appointment:  [x]   Medications: Please bring all your medication bottles to your next appointment to ensure we have an accurate  record of your prescriptions. [x]   Health Diaries: If you're monitoring any health conditions at home, keeping a diary of your readings can be very helpful for discussions at your next appointment.  Reviewing Your Records  [x]   Review your attached preventive care information at the end of these patient instructions. [x]   Review this early draft of your clinical encounter notes below and the final encounter summary tomorrow on MyChart after its been completed.      Getting Answers and Following Up  [x]   Simple Questions & Concerns: For quick questions or basic follow-up after your visit, reach us  at [phone] or MyChart messaging. [x]   Complex Concerns: If your concern is more complex, scheduling an appointment might be best. Discuss this with the staff to find the most suitable option. [x]   Lab & Imaging Results: We'll contact you directly if results are abnormal or you don't use MyChart. Most normal results will be on MyChart within 2-3 business days, with a review message from Dr. Jesus. Haven't heard back in 2 weeks? Need results sooner? Contact us  at [phone]. [x]   Referrals: Our referral coordinator will manage specialist referrals. The specialist's office should contact you within 2 weeks to schedule an appointment. Call us  if you haven't heard from them after 2 weeks.  Staying Connected  [x]   MyChart: Activate your MyChart for the fastest way to access results and message us . See the last page of this paperwork for instructions on how to activate.  Billing  [x]   X-ray & Lab Orders: These are billed by separate companies.  Contact the invoicing company directly for questions or concerns. [x]   Visit Charges: Discuss any billing inquiries with our administrative services team.  Your Satisfaction Matters  [x]   Share Your Experience: We strive for your satisfaction! If you have any complaints, or preferably compliments, please let Dr. Jesus know directly or contact our  Practice Administrators, Manuelita Rubin or [employer], by asking at the front desk.                 Next Steps  [x]   Schedule Follow-Up:  We recommend a follow-up appointment in 1 year for your next wellness visit.  If you develop any new problems, want to address any medical issues, or your condition worsens before then, please call us  for an appointment or seek emergency care. [x]   Preventive Care:  Make sure to keep regular appointments with dental and vision professionals, use nightly nasal saline mist sprays to keep your sinuses clear and toothbrushing to protect your teeth. Use SnoreLab App or other app to track your sleep quality. Check blood pressure and heart rate routinely. [x]   Medical Information Release:  For any relevant medical information we don't have, please sign a release form at the front desk so we can obtain it for your records. [x]   Lab Tests:  Schedule any lab tests from today for within a week to ensure best insurance coverage.    Making the Most of Our Focused (20 minute) Appointments:  [x]   Clearly state your top concerns at the beginning of the visit to focus our discussion [x]   If you anticipate you will need more time, please inform the front desk during scheduling - we can book multiple appointments in the same week. [x]   If you have transportation problems- use our convenient video appointments or ask about transportation support. [x]   We can get down to business faster if you use MyChart to update information before the visit and submit non-urgent questions before your visit. Thank you for taking the time to provide details through MyChart.  Let our nurse know and she can import this information into your encounter documents.  Arrival and Wait Times: [x]   Arriving on time ensures that everyone receives prompt attention. [x]   Early morning (8a) and afternoon (1p) appointments tend to have shortest wait times. [x]   Unfortunately, we cannot delay  appointments for late arrivals or hold slots during phone calls.  Bring to Your Next Appointment  [x]   Medications: Please bring all your medication bottles to your next appointment to ensure we have an accurate record of your prescriptions. [x]   Health Diaries: If you're monitoring any health conditions at home, keeping a diary of your readings can be very helpful for discussions at your next appointment.  Reviewing Your Records  [x]   Review your attached preventive care information at the end of these patient instructions. [x]   Review this early draft of your clinical encounter notes below and the final encounter summary tomorrow on MyChart after its been completed.   Encounter for annual general medical examination with abnormal findings in adult -     Lipid panel -     Comprehensive metabolic panel with GFR -     CBC with Differential/Platelet -     TSH Rfx on Abnormal to Free T4  OSA (obstructive sleep apnea) Assessment & Plan: Symptoms have significantly improved with CPAP use, and current settings are effective with events per hour below five. CPAP is not without risks, particularly at high pressures, which can cause  nasal irritation. Continue CPAP therapy. Consider nasal surgery if CPAP becomes intolerable or ineffective.ENT evaluation confirmed a deviated septum, but turbinates are not significantly enlarged. CPAP is effectively managing symptoms. Monitor symptoms and consider nasal surgery if CPAP becomes insufficient.He primarily uses Claritin instead of Zyrtec. Flonase  causes nasal dryness, so it is not used daily. Continue Claritin as needed and use Flonase  as tolerated.   Nasal obstruction Assessment & Plan: Symptoms have significantly improved with CPAP use, and current settings are effective with events per hour below five. CPAP is not without risks, particularly at high pressures, which can cause nasal irritation. Continue CPAP therapy. Consider nasal surgery if CPAP becomes  intolerable or ineffective.ENT evaluation confirmed a deviated septum, but turbinates are not significantly enlarged. CPAP is effectively managing symptoms. Monitor symptoms and consider nasal surgery if CPAP becomes insufficient.He primarily uses Claritin instead of Zyrtec. Flonase  causes nasal dryness, so it is not used daily. Continue Claritin as needed and use Flonase  as tolerated.   Seasonal allergies Assessment & Plan: Symptoms have significantly improved with CPAP use, and current settings are effective with events per hour below five. CPAP is not without risks, particularly at high pressures, which can cause nasal irritation. Continue CPAP therapy. Consider nasal surgery if CPAP becomes intolerable or ineffective.ENT evaluation confirmed a deviated septum, but turbinates are not significantly enlarged. CPAP is effectively managing symptoms. Monitor symptoms and consider nasal surgery if CPAP becomes insufficient.He primarily uses Claritin instead of Zyrtec. Flonase  causes nasal dryness, so it is not used daily. Continue Claritin as needed and use Flonase  as tolerated.   Deviated septum  Neoplasm of uncertain behavior of skin -     Ambulatory referral to Dermatology  Hyperlipidemia, unspecified hyperlipidemia type -     Lipid panel  Other orders -     Heplisav-B  (HepB-CPG) Vaccine -     HPV 9-valent vaccine,Recombinat     Getting Answers and Following Up  [x]   Simple Questions & Concerns: For quick questions or basic follow-up after your visit, reach us  at [phone] or MyChart messaging. [x]   Complex Concerns: If your concern is more complex, scheduling an appointment might be best. Discuss this with the staff to find the most suitable option. [x]   Lab & Imaging Results: We'll contact you directly if results are abnormal or you don't use MyChart. Most normal results will be on MyChart within 2-3 business days, with a review message from Dr. Jesus. Haven't heard back in 2  weeks? Need results sooner? Contact us  at [phone]. [x]   Referrals: Our referral coordinator will manage specialist referrals. The specialist's office should contact you within 2 weeks to schedule an appointment. Call us  if you haven't heard from them after 2 weeks.  Staying Connected  [x]   MyChart: Activate your MyChart for the fastest way to access results and message us . See the last page of this paperwork for instructions on how to activate.  Billing  [x]   X-ray & Lab Orders: These are billed by separate companies. Contact the invoicing company directly for questions or concerns. [x]   Visit Charges: Discuss any billing inquiries with our administrative services team.  Your Satisfaction Matters  [x]   Share Your Experience: We strive for your satisfaction! If you have any complaints, or preferably compliments, please let Dr. Jesus know directly or contact our Practice Administrators, Manuelita Rubin or [employer], by asking at the front desk.    Medical Screening Exam A medical screening exam (MSE) helps to determine whether you need immediate  medical treatment relating to any number of symptoms you are having. This type of exam may be done in an emergency department, an urgent care setting, or your health care provider's office. Depending on your symptoms and severity, you may need additional tests or medical therapy. It is important to note that an MSE does not necessarily mean that you will need or receive further medical testing or interventions if your symptoms are not deemed to be medically urgent (emergent). Tell a health care provider about: Any allergies you have. All medicines you are taking, including vitamins, herbs, eye drops, creams, and over-the-counter medicines. Any problems you or family members have had with anesthetic medicines. Any bleeding problems you have. Any surgeries you have had. Any medical conditions you have. Whether you are pregnant or may be  pregnant. What happens during the test? During the exam, a health care provider does a short, often focused, physical exam and asks about your medical history to assess: Your current symptoms. Your overall health. Your need for possible further medical intervention. What can I expect after the test? If you have a regular health care provider, make an appointment for a follow-up visit with him or her. If you do not have a regular health care provider, ask about resources in your community. Your medical screening exam may determine that: You do not need emergency treatment at this time. You need treatment right away. You need to be transferred to another medical center. This may happen if you need an emergent specialist or consultant that is not available at the medical center you are at. You need to have more tests. A medical specialist may be consulted if needed. Get help right away if: Your condition gets worse. You develop new or troubling symptoms before you see your health care provider. These symptoms may represent a serious problem that is an emergency. Do not wait to see if the symptoms will go away. Get medical help right away. Call your local emergency services (911 in the [place] Do not drive yourself to the hospital. Summary A medical screening exam helps to determine whether you need medical treatment right away. This type of exam may be done in an emergency department, an urgent care setting, or your health care provider's office. During the exam, a health care provider does a short physical exam and asks about your current symptoms and overall health. Depending on the exam, more tests or therapies may be ordered. However, an MSE does not necessarily mean that you will have further medical testing if your symptoms are not deemed to be urgent. If you need further care that is not offered at your current medical center, you may need to be transferred to another facility. This  information is not intended to replace advice given to you by your health care provider. Make sure you discuss any questions you have with your health care provider. Document Revised: 08/14/2021 Document Reviewed: 04/11/2021 Elsevier Patient Education  2024 [employer].

## 2024-08-26 NOTE — Assessment & Plan Note (Signed)
 Symptoms have significantly improved with CPAP use, and current settings are effective with events per hour below five. CPAP is not without risks, particularly at high pressures, which can cause nasal irritation. Continue CPAP therapy. Consider nasal surgery if CPAP becomes intolerable or ineffective.ENT evaluation confirmed a deviated septum, but turbinates are not significantly enlarged. CPAP is effectively managing symptoms. Monitor symptoms and consider nasal surgery if CPAP becomes insufficient.He primarily uses Claritin instead of Zyrtec. Flonase  causes nasal dryness, so it is not used daily. Continue Claritin as needed and use Flonase  as tolerated.

## 2024-08-27 LAB — TSH RFX ON ABNORMAL TO FREE T4: TSH: 1.45 u[IU]/mL (ref 0.450–4.500)

## 2024-08-28 ENCOUNTER — Ambulatory Visit: Payer: Self-pay | Admitting: Internal Medicine

## 2024-08-29 NOTE — Telephone Encounter (Signed)
 read by Evalene Bluford Rider at 6:45AM on 08/29/2024.

## 2024-09-13 DIAGNOSIS — G4733 Obstructive sleep apnea (adult) (pediatric): Secondary | ICD-10-CM | POA: Diagnosis not present

## 2024-09-16 DIAGNOSIS — Z23 Encounter for immunization: Secondary | ICD-10-CM | POA: Diagnosis not present

## 2024-09-20 ENCOUNTER — Other Ambulatory Visit

## 2024-09-20 DIAGNOSIS — Z006 Encounter for examination for normal comparison and control in clinical research program: Secondary | ICD-10-CM

## 2024-09-28 ENCOUNTER — Ambulatory Visit (INDEPENDENT_AMBULATORY_CARE_PROVIDER_SITE_OTHER)

## 2024-09-28 DIAGNOSIS — Z23 Encounter for immunization: Secondary | ICD-10-CM

## 2024-09-28 NOTE — Progress Notes (Signed)
 Patient is in office today for a nurse visit for Immunization. Patient Injection was given in the  Left deltoid. Patient tolerated injection well.

## 2024-09-29 ENCOUNTER — Ambulatory Visit (INDEPENDENT_AMBULATORY_CARE_PROVIDER_SITE_OTHER): Admitting: Dermatology

## 2024-09-29 ENCOUNTER — Encounter: Payer: Self-pay | Admitting: Dermatology

## 2024-09-29 VITALS — BP 101/79 | HR 81

## 2024-09-29 DIAGNOSIS — D489 Neoplasm of uncertain behavior, unspecified: Secondary | ICD-10-CM | POA: Diagnosis not present

## 2024-09-29 DIAGNOSIS — L720 Epidermal cyst: Secondary | ICD-10-CM | POA: Diagnosis not present

## 2024-09-29 NOTE — Progress Notes (Addendum)
   New Patient Visit   Subjective  Leroy Griffith is a 38 y.o. male who presents for the following: skin lesion  Patient states he has lesion located on the left temple that he  would like to have examined. Patient reports the areas have been there for 6 months. He reports the areas are not bothersome. Patient reports it is not tender or itchy. He states that the areas have not spread. Patient reports he  has not previously been treated for these areas. Patient denies Hx of bx. Patient reports family history of skin cancers (fathers)  The patient has spots, moles and lesions to be evaluated, some may be new or changing and the patient may have concern these could be cancer.   The following portions of the chart were reviewed this encounter and updated as appropriate: medications, allergies, medical history  Review of Systems:  No other skin or systemic complaints except as noted in HPI or Assessment and Plan.  Objective  Well appearing patient in no apparent distress; mood and affect are within normal limits.   A focused examination was performed of the following areas: Left Temple  Relevant exam findings are noted in the Assessment and Plan.    Left Temple Pink papule with 2 white inclusion cysts  Assessment & Plan   Suspicious facial skin lesion, possible basal cell carcinoma versus milia The lesion on the face has been present for approximately five to six months and appears to be slowly expanding. It consists of a pink papule with two white inclusion cysts. The differential diagnosis includes basal cell carcinoma versus milia. There is a possibility of a basal cell component due to tiny vessel changes at the base of the lesion, but milia is also present. A biopsy is necessary to confirm the diagnosis. - Perform a conservative shave biopsy of the lesion. - Apply Aquaphor and a small bandage daily for a week to promote healing. - Massage the area with Aquaphor for a month after  re-epithelialization to minimize scarring. - Send biopsy results to him via MyChart if benign; call if results are abnormal. - Schedule a follow-up for a head-to-toe skin cancer screening.  NEOPLASM OF UNCERTAIN BEHAVIOR Left Temple Epidermal / dermal shaving  Lesion diameter (cm):  0.5 Informed consent: discussed and consent obtained   Timeout: patient name, date of birth, surgical site, and procedure verified   Instrument used: DermaBlade   Hemostasis achieved with: aluminum chloride   Outcome: patient tolerated procedure well   Post-procedure details: wound care instructions given    Specimen A - Surgical pathology Differential Diagnosis: r/o BCC vs Milia  Check Margins: No  Return in about 4 weeks (around 10/27/2024) for next available appointment for [facility] F/U.  I, Lyle Cords, as acting as a neurosurgeon for [employer], DO .   Documentation: I have reviewed the above documentation for accuracy and completeness, and I agree with the above.  Delon Lenis, DO

## 2024-09-29 NOTE — Patient Instructions (Addendum)

## 2024-09-30 LAB — SURGICAL PATHOLOGY

## 2024-10-01 LAB — GENECONNECT MOLECULAR SCREEN: Genetic Analysis Overall Interpretation: NEGATIVE

## 2024-10-04 ENCOUNTER — Ambulatory Visit: Payer: Self-pay | Admitting: Dermatology

## 2024-10-27 ENCOUNTER — Telehealth: Payer: Self-pay

## 2024-10-27 NOTE — Telephone Encounter (Signed)
 Copied from CRM #[id number]. Topic: General - Other >> Oct 27, 2024  1:25 PM Roselie BROCKS wrote: Reason for CRM: [facility] calling to confirm patient is with the clinic and is faxing information to the clinic ,  noted

## 2025-03-13 ENCOUNTER — Ambulatory Visit: Admitting: Dermatology

## 2025-08-11 ENCOUNTER — Encounter: Admitting: Internal Medicine

## 2025-09-01 ENCOUNTER — Encounter: Admitting: Internal Medicine
